# Patient Record
Sex: Female | Born: 1950 | Race: White | Hispanic: No | Marital: Married | Smoking: Never smoker
Health system: Southern US, Community
[De-identification: ages and names within clinical notes are randomized; demographics above are authoritative.]

## PROBLEM LIST (undated history)

## (undated) DIAGNOSIS — Z9109 Other allergy status, other than to drugs and biological substances: Secondary | ICD-10-CM

## (undated) DIAGNOSIS — E119 Type 2 diabetes mellitus without complications: Secondary | ICD-10-CM

## (undated) DIAGNOSIS — K219 Gastro-esophageal reflux disease without esophagitis: Secondary | ICD-10-CM

## (undated) DIAGNOSIS — I10 Essential (primary) hypertension: Secondary | ICD-10-CM

## (undated) DIAGNOSIS — M1990 Unspecified osteoarthritis, unspecified site: Secondary | ICD-10-CM

## (undated) DIAGNOSIS — C801 Malignant (primary) neoplasm, unspecified: Secondary | ICD-10-CM

## (undated) DIAGNOSIS — J45909 Unspecified asthma, uncomplicated: Secondary | ICD-10-CM

## (undated) HISTORY — PX: TONSILLECTOMY: SUR1361

## (undated) HISTORY — PX: BACK SURGERY: SHX140

## (undated) HISTORY — PX: ABDOMINAL HYSTERECTOMY: SHX81

## (undated) HISTORY — PX: BREAST SURGERY: SHX581

---

## 2003-11-12 DIAGNOSIS — C50919 Malignant neoplasm of unspecified site of unspecified female breast: Secondary | ICD-10-CM

## 2003-11-12 HISTORY — DX: Malignant neoplasm of unspecified site of unspecified female breast: C50.919

## 2008-11-11 DIAGNOSIS — C55 Malignant neoplasm of uterus, part unspecified: Secondary | ICD-10-CM

## 2008-11-11 HISTORY — DX: Malignant neoplasm of uterus, part unspecified: C55

## 2015-12-24 ENCOUNTER — Emergency Department (HOSPITAL_BASED_OUTPATIENT_CLINIC_OR_DEPARTMENT_OTHER)
Admission: EM | Admit: 2015-12-24 | Discharge: 2015-12-24 | Disposition: A | Discharge status: Home / Self Care | Payer: Medicare Other | Attending: Emergency Medicine | Admitting: Emergency Medicine

## 2015-12-24 ENCOUNTER — Encounter (HOSPITAL_BASED_OUTPATIENT_CLINIC_OR_DEPARTMENT_OTHER): Payer: Self-pay | Admitting: Emergency Medicine

## 2015-12-24 ENCOUNTER — Emergency Department (HOSPITAL_BASED_OUTPATIENT_CLINIC_OR_DEPARTMENT_OTHER): Payer: Medicare Other

## 2015-12-24 DIAGNOSIS — Y9289 Other specified places as the place of occurrence of the external cause: Secondary | ICD-10-CM | POA: Insufficient documentation

## 2015-12-24 DIAGNOSIS — S81011A Laceration without foreign body, right knee, initial encounter: Secondary | ICD-10-CM | POA: Insufficient documentation

## 2015-12-24 DIAGNOSIS — Y9389 Activity, other specified: Secondary | ICD-10-CM | POA: Insufficient documentation

## 2015-12-24 DIAGNOSIS — S20219A Contusion of unspecified front wall of thorax, initial encounter: Secondary | ICD-10-CM | POA: Insufficient documentation

## 2015-12-24 DIAGNOSIS — Z791 Long term (current) use of non-steroidal anti-inflammatories (NSAID): Secondary | ICD-10-CM | POA: Diagnosis not present

## 2015-12-24 DIAGNOSIS — K219 Gastro-esophageal reflux disease without esophagitis: Secondary | ICD-10-CM | POA: Insufficient documentation

## 2015-12-24 DIAGNOSIS — E119 Type 2 diabetes mellitus without complications: Secondary | ICD-10-CM | POA: Diagnosis not present

## 2015-12-24 DIAGNOSIS — Z880 Allergy status to penicillin: Secondary | ICD-10-CM | POA: Insufficient documentation

## 2015-12-24 DIAGNOSIS — J45909 Unspecified asthma, uncomplicated: Secondary | ICD-10-CM | POA: Insufficient documentation

## 2015-12-24 DIAGNOSIS — W108XXA Fall (on) (from) other stairs and steps, initial encounter: Secondary | ICD-10-CM | POA: Insufficient documentation

## 2015-12-24 DIAGNOSIS — Z7951 Long term (current) use of inhaled steroids: Secondary | ICD-10-CM | POA: Diagnosis not present

## 2015-12-24 DIAGNOSIS — S60221A Contusion of right hand, initial encounter: Secondary | ICD-10-CM | POA: Diagnosis not present

## 2015-12-24 DIAGNOSIS — Z79899 Other long term (current) drug therapy: Secondary | ICD-10-CM | POA: Diagnosis not present

## 2015-12-24 DIAGNOSIS — S6992XA Unspecified injury of left wrist, hand and finger(s), initial encounter: Secondary | ICD-10-CM | POA: Insufficient documentation

## 2015-12-24 DIAGNOSIS — I10 Essential (primary) hypertension: Secondary | ICD-10-CM | POA: Diagnosis not present

## 2015-12-24 DIAGNOSIS — Z7984 Long term (current) use of oral hypoglycemic drugs: Secondary | ICD-10-CM | POA: Insufficient documentation

## 2015-12-24 DIAGNOSIS — M1990 Unspecified osteoarthritis, unspecified site: Secondary | ICD-10-CM | POA: Insufficient documentation

## 2015-12-24 DIAGNOSIS — Y998 Other external cause status: Secondary | ICD-10-CM | POA: Diagnosis not present

## 2015-12-24 DIAGNOSIS — Z8589 Personal history of malignant neoplasm of other organs and systems: Secondary | ICD-10-CM | POA: Insufficient documentation

## 2015-12-24 DIAGNOSIS — M25561 Pain in right knee: Secondary | ICD-10-CM

## 2015-12-24 DIAGNOSIS — W19XXXA Unspecified fall, initial encounter: Secondary | ICD-10-CM

## 2015-12-24 DIAGNOSIS — S8991XA Unspecified injury of right lower leg, initial encounter: Secondary | ICD-10-CM | POA: Diagnosis present

## 2015-12-24 HISTORY — DX: Other allergy status, other than to drugs and biological substances: Z91.09

## 2015-12-24 HISTORY — DX: Unspecified asthma, uncomplicated: J45.909

## 2015-12-24 HISTORY — DX: Essential (primary) hypertension: I10

## 2015-12-24 HISTORY — DX: Type 2 diabetes mellitus without complications: E11.9

## 2015-12-24 HISTORY — DX: Unspecified osteoarthritis, unspecified site: M19.90

## 2015-12-24 HISTORY — DX: Malignant (primary) neoplasm, unspecified: C80.1

## 2015-12-24 HISTORY — DX: Gastro-esophageal reflux disease without esophagitis: K21.9

## 2015-12-24 MED ORDER — ACETAMINOPHEN 325 MG PO TABS
650.0000 mg | ORAL_TABLET | Freq: Once | ORAL | Status: AC
  Administered 2015-12-24: 650 mg via ORAL
  Filled 2015-12-24: qty 2

## 2015-12-24 MED ORDER — LIDOCAINE-EPINEPHRINE-TETRACAINE (LET) SOLUTION
3.0000 mL | Freq: Once | NASAL | Status: AC
  Administered 2015-12-24: 3 mL via TOPICAL
  Filled 2015-12-24: qty 3

## 2015-12-24 NOTE — ED Provider Notes (Signed)
CSN: [id number]     Arrival date & time 12/24/15  1638 History  By signing my name below, I, Soijett Blue, attest that this documentation has been prepared under the direction and in the presence of Josephina Gip, PA-C Electronically Signed: Soijett Blue, ED Scribe. 12/24/2015. 5:21 PM.   Chief Complaint  Patient presents with  . Fall   The history is provided by the patient. No language interpreter was used.    Laura Sherman is a 65 y.o. female with a medical hx of DM, arthritis, HTN, CA who presents to the Emergency Department complaining of a fall onset today. Pt notes that she tripped on a dog leash and fell down 2 concrete steps off her porch. She states that she fell down onto her left side at the time of the incident. Pt is having associated symptoms of left sided rib pain with associated bruising, right knee pain and bilateral hand pain. The rib pain is mild and does not increase with breathing. She denies SOB. She states that her hands are mildly aching and the pain is exacerbated by gripping. She endorses an ache in her knee from the fall but states she has chronic knee pain from advanced arthritis. Pt reports that she get injections to her right knee q 6 months and she is aware that she is in need of knee replacement surgery due to her arthritis. She also endorses a small abrasion to her right knee. Bleeding controlled with gauze at the time. She notes that she has not tried any medications for the relief of her symptoms. She states "I'm fine, I wouldn't have come here if my daughter didn't make me". She denies hitting her head, LOC, SOB, numbness, tingling, gait problem, weakness, neck pain, back pain, and any other symptoms. Pt denies being on blood thinners at this time, but she does take ASA daily.    Past Medical History  Diagnosis Date  . Asthma   . Diabetes mellitus without complication ([place])   . Arthritis   . Cancer ([place])   . Hypertension   . GERD (gastroesophageal reflux  disease)   . Environmental allergies    Past Surgical History  Procedure Laterality Date  . Back surgery    . Breast surgery    . Abdominal hysterectomy    . Tonsillectomy     History reviewed. No pertinent family history. Social History  Substance Use Topics  . Smoking status: Never Smoker   . Smokeless tobacco: None  . Alcohol Use: No   OB History    No data available     Review of Systems  Respiratory: Negative for shortness of breath.   Musculoskeletal: Positive for arthralgias. Negative for back pain, joint swelling and neck pain.  Skin: Positive for color change and wound. Negative for rash.  Neurological: Negative for syncope, numbness and headaches.       No tingling  All other systems reviewed and are negative.    Allergies  Penicillins  Home Medications   Prior to Admission medications   Medication Sig Start Date End Date Taking? Authorizing Provider  amLODipine (NORVASC) 10 MG tablet Take 10 mg by mouth daily.   Yes Historical Provider, MD  atorvastatin (LIPITOR) 20 MG tablet Take 20 mg by mouth daily.   Yes Historical Provider, MD  budesonide (PULMICORT) 180 MCG/ACT inhaler Inhale into the lungs 2 (two) times daily.   Yes Historical Provider, MD  diclofenac (VOLTAREN) 75 MG EC tablet Take 75 mg by mouth  2 (two) times daily.   Yes Historical Provider, MD  fluticasone (FLOVENT DISKUS) 50 MCG/BLIST diskus inhaler Inhale 1 puff into the lungs 2 (two) times daily.   Yes Historical Provider, MD  Hylan (SYNVISC IX) Inject into the articular space.   Yes Historical Provider, MD  loratadine (CLARITIN) 10 MG tablet Take 10 mg by mouth daily.   Yes Historical Provider, MD  losartan-hydrochlorothiazide (HYZAAR) 100-25 MG tablet Take 1 tablet by mouth daily.   Yes Historical Provider, MD  metFORMIN (GLUCOPHAGE) 500 MG tablet Take by mouth 2 (two) times daily with a meal.   Yes Historical Provider, MD  metoprolol (LOPRESSOR) 100 MG tablet Take 100 mg by mouth 2 (two)  times daily.   Yes Historical Provider, MD  omeprazole (PRILOSEC) 10 MG capsule Take 10 mg by mouth daily.   Yes Historical Provider, MD  pioglitazone (ACTOS) 30 MG tablet Take 30 mg by mouth daily.   Yes Historical Provider, MD   BP 149/79 mmHg  Pulse 87  Temp(Src) 98.1 F (36.7 C) (Oral)  Resp 18  SpO2 97% Physical Exam  Constitutional: She appears well-developed and well-nourished. No distress.  HENT:  Head: Normocephalic and atraumatic.  Right Ear: External ear normal.  Left Ear: External ear normal.  Eyes: Conjunctivae are normal. Right eye exhibits no discharge. Left eye exhibits no discharge. No scleral icterus.  Neck: Normal range of motion.  Cardiovascular: Normal rate, regular rhythm, normal heart sounds and intact distal pulses.  Exam reveals no gallop and no friction rub.   No murmur heard. Cap refill < 3 seconds  Pulmonary/Chest: Effort normal and breath sounds normal. No respiratory distress. She has no wheezes. She has no rales. She exhibits tenderness. She exhibits no crepitus, no deformity and no retraction.    Lungs clear to ausculation bilaterally. Mild tenderness over superior left anterior chest wall with faint ecchymosis. No bony deformities.   Musculoskeletal: Normal range of motion.       Right knee: She exhibits laceration. She exhibits normal range of motion, no swelling, no ecchymosis and normal alignment. Tenderness found.       Right hand: She exhibits tenderness. She exhibits normal range of motion, normal capillary refill, no deformity and no swelling. Normal sensation noted. Normal strength noted.       Left hand: She exhibits tenderness. She exhibits normal range of motion, normal capillary refill, no deformity and no swelling. Normal sensation noted. Normal strength noted.       Hands:      Legs: Tenderness over left 5th metacarpal head. Tenderness and faint ecchymosis over right thenar eminence. FROM of digits, wrists and elbows without pain. No edema  or deformity of the upper extremities. Mild pain over anterior knee. FROM intact without malalignment. Laceration to right anterior knee with bleeding controlled.   Neurological: She is alert. Coordination normal.  5/5 strength of all major muscle groups. Sensation to light touch intact throughout.   Skin: Skin is warm and dry.  1.5 cm laceration noted to right anterior knee. Wound edges approximate well. Depth of approximately 5 mm. No foreign bodies visualized after irrigation.   Psychiatric: She has a normal mood and affect. Her behavior is normal.  Nursing note and vitals reviewed.   ED Course  Procedures (including critical care time) LACERATION REPAIR Performed by: Eston Esters Authorized by: Eston Esters Consent: Verbal consent obtained. Risks and benefits: risks, benefits and alternatives were discussed Consent given by: patient Patient identity confirmed: provided demographic data  Prepped and Draped in normal sterile fashion Wound explored  Laceration Location: right knee  Laceration Length: 1.5 cm  No Foreign Bodies seen or palpated  Anesthesia: local infiltration  Local anesthetic: LET  Irrigation method: syringe  Amount of cleaning: standard  Skin closure: 3-0 prolene  Number of sutures: 1  Technique: simple interrupted  Patient tolerance: Patient tolerated the procedure well with no immediate complications.   DIAGNOSTIC STUDIES: Oxygen Saturation is 99% on RA, nl by my interpretation.    COORDINATION OF CARE: 5:21 PM Discussed treatment plan with pt at bedside which includes right knee xray and tylenol, and pt agreed to plan.   Labs Review Labs Reviewed - No data to display  Imaging Review Dg Chest 2 View  12/24/2015  CLINICAL DATA:  Tripped on a dog leash and fell down 2 concrete steps off porch. Left-sided rib pain. Initial encounter. EXAM: CHEST  2 VIEW COMPARISON:  None. FINDINGS: The cardiac silhouette is upper limits of normal in size.  There is minimal left basilar atelectasis or scarring. No edema, pleural effusion, or pneumothorax is seen. No displaced rib fracture is identified. Thoracic spondylosis and right breast surgical clips are noted. IMPRESSION: Minimal left basilar atelectasis or scarring. Electronically Signed   By: Logan Bores M.D.   On: 12/24/2015 18:19   Dg Knee Complete 4 Views Right  12/24/2015  CLINICAL DATA:  65 year old female with fall and right knee pain. EXAM: RIGHT KNEE - COMPLETE 4+ VIEW COMPARISON:  None. FINDINGS: There is no acute fracture or dislocation. There is osteopenia with advanced osteoarthritic changes of the right knee with of severe tricompartmental narrowing and osteophyte formation. No significant joint effusion identified. The soft tissues are grossly unremarkable. A 1.5 cm focus of in amorphous calcification is noted within the soft tissues of the distal thigh along the lateral aspect of the distal femur. IMPRESSION: No acute fracture or dislocation. Advanced osteoarthritic changes. Electronically Signed   By: Anner Crete M.D.   On: 12/24/2015 18:16   Dg Hand Complete Left  12/24/2015  CLINICAL DATA:  Tripped on a dog leash and fell down 2 concrete steps off porch. Bilateral hand pain and abrasions. Initial encounter. EXAM: LEFT HAND - COMPLETE 3+ VIEW COMPARISON:  None. FINDINGS: No acute fracture or dislocation is identified. The bones are diffusely osteopenic. Degenerative changes are noted at the first [facility] joint including prominent joint space loss and osteophyte formation. No destructive osseous lesion or soft tissue abnormality is seen. IMPRESSION: 1. No acute osseous abnormality identified. 2. Advanced first [facility] joint degenerative change. Electronically Signed   By: Logan Bores M.D.   On: 12/24/2015 18:19   Dg Hand Complete Right  12/24/2015  CLINICAL DATA:  Fall down 2 steps, bilateral hand pain EXAM: RIGHT HAND - COMPLETE 3+ VIEW COMPARISON:  None. FINDINGS: No fracture or  dislocation is seen. Moderate degenerative changes of the 1st carpometacarpal joint. Periarticular osteopenia. Mild negative ulnar variance. The visualized soft tissues are unremarkable. IMPRESSION: No fracture or dislocation is seen. Electronically Signed   By: Julian Hy M.D.   On: 12/24/2015 18:13   I have personally reviewed and evaluated these images as part of my medical decision-making.   EKG Interpretation None      MDM   Final diagnoses:  Fall, initial encounter  Right knee pain   65 year old female presenting after a fall. No head injury or LOC. Complaining of right knee, bilateral hand and left rib pain. VSS. Pt is nontoxic appearing.  Extremities are neurovascularly intact with FROM. No obvious edema or deformity. Small 1.5 cm laceration noted to anterior knee right. Pt moves all extremities spontaneously and walks with a steady gait. X-ray of chest, hands and knee negative. Pain treated with tylenol. Laceration repaired.Pressure irrigation performed. Wound explored and base of wound visualized in a bloodless field without evidence of foreign body. Instructed pt to follow up with PCP, UC or ED in 8-10 days for suture removal. Recommend conservative treatment including ice, heat, tylenol or motrin for pain control. Pt seen by Dr. Laneta Simmers who agrees with plan and discharge. Return precautions given in discharge paperwork and discussed with pt at bedside. Pt stable for discharge  I personally performed the services described in this documentation, which was scribed in my presence. The recorded information has been reviewed and is accurate.   Josephina Gip, PA-C 12/24/15 2000  Leo Grosser, MD 12/25/15 [id number]

## 2015-12-24 NOTE — ED Notes (Signed)
Patient states that she fell off her porch. Has pain to her right knee, and pain to her left ribs.

## 2015-12-24 NOTE — Discharge Instructions (Signed)
Go to your PCP, an urgent care or return to ED in 8-10 days for suture removal.    Knee Pain Knee pain is a very common symptom and can have many causes. Knee pain often goes away when you follow your health care provider's instructions for relieving pain and discomfort at home. However, knee pain can develop into a condition that needs treatment. Some conditions may include:  Arthritis caused by wear and tear (osteoarthritis).  Arthritis caused by swelling and irritation (rheumatoid arthritis or gout).  A cyst or growth in your knee.  An infection in your knee joint.  An injury that will not heal.  Damage, swelling, or irritation of the tissues that support your knee (torn ligaments or tendinitis). If your knee pain continues, additional tests may be ordered to diagnose your condition. Tests may include X-rays or other imaging studies of your knee. You may also need to have fluid removed from your knee. Treatment for ongoing knee pain depends on the cause, but treatment may include:  Medicines to relieve pain or swelling.  Steroid injections in your knee.  Physical therapy.  Surgery. HOME CARE INSTRUCTIONS  Take medicines only as directed by your health care provider.  Rest your knee and keep it raised (elevated) while you are resting.  Do not do things that cause or worsen pain.  Avoid high-impact activities or exercises, such as running, jumping rope, or doing jumping jacks.  Apply ice to the knee area:  Put ice in a plastic bag.  Place a towel between your skin and the bag.  Leave the ice on for 20 minutes, 2-3 times a day.  Ask your health care provider if you should wear an elastic knee support.  Keep a pillow under your knee when you sleep.  Lose weight if you are overweight. Extra weight can put pressure on your knee.  Do not use any tobacco products, including cigarettes, chewing tobacco, or electronic cigarettes. If you need help quitting, ask your health  care provider. Smoking may slow the healing of any bone and joint problems that you may have. SEEK MEDICAL CARE IF:  Your knee pain continues, changes, or gets worse.  You have a fever along with knee pain.  Your knee buckles or locks up.  Your knee becomes more swollen. SEEK IMMEDIATE MEDICAL CARE IF:   Your knee joint feels hot to the touch.  You have chest pain or trouble breathing.   This information is not intended to replace advice given to you by your health care provider. Make sure you discuss any questions you have with your health care provider.   Document Released: 08/25/2007 Document Revised: 11/18/2014 Document Reviewed: 06/13/2014 Elsevier Interactive Patient Education 2016 [place]    Sutures are stitches that can be used to close wounds. Taking care of your wound properly can help to prevent pain and infection. It can also help your wound to heal more quickly.  HOW TO CARE FOR YOUR SUTURED WOUND  Wound Care  Keep the wound clean and dry.  If you were given a bandage (dressing), you should change it at least once per day or as directed by your health care provider. You should also change it if it becomes wet or dirty.  Keep the wound completely dry for the first 24 hours or as directed by your health care provider. After that time, you may shower or bathe. However, make sure that the wound is not soaked in water until  the sutures have been removed.  Clean the wound one time each day or as directed by your health care provider.  Wash the wound with soap and water.  Rinse the wound with water to remove all soap.  Pat the wound dry with a clean towel. Do not rub the wound. After cleaning the wound, apply a thin layer of antibiotic ointment as directed by your health care provider. This will help to prevent infection and keep the dressing from sticking to the wound.  Have the sutures removed as directed by your health care provider. General  Instructions  Take or apply medicines only as directed by your health care provider.  To help prevent scarring, make sure to cover your wound with sunscreen whenever you are outside after the sutures are removed and the wound is healed. Make sure to wear a sunscreen of at least 30 SPF.  If you were prescribed an antibiotic medicine or ointment, finish all of it even if you start to feel better.  Do not scratch or pick at the wound.  Keep all follow-up visits as directed by your health care provider. This is important.  Check your wound every day for signs of infection. Watch for:  Redness, swelling, or pain.  Fluid, blood, or pus. Raise (elevate) the injured area above the level of your heart while you are sitting or lying down, if possible.  Avoid stretching your wound.  Drink enough fluids to keep your urine clear or pale yellow. SEEK MEDICAL CARE IF:  You received a tetanus shot and you have swelling, severe pain, redness, or bleeding at the injection site.  You have a fever.  A wound that was closed breaks open.  You notice a bad smell coming from the wound.  You notice something coming out of the wound, such as wood or glass.  Your pain is not controlled with medicine.  You have increased redness, swelling, or pain at the site of your wound.  You have fluid, blood, or pus coming from your wound.  You notice a change in the color of your skin near your wound.  You need to change the dressing frequently due to fluid, blood, or pus draining from the wound.  You develop a new rash.  You develop numbness around the wound. SEEK IMMEDIATE MEDICAL CARE IF:  You develop severe swelling around the injury site.  Your pain suddenly increases and is severe.  You develop painful lumps near the wound or on skin that is anywhere on your body.  You have a red streak going away from your wound.  The wound is on your hand or foot and you cannot properly move a finger or toe.  The wound is on your  hand or foot and you notice that your fingers or toes look pale or bluish. This information is not intended to replace advice given to you by your health care provider. Make sure you discuss any questions you have with your health care provider.  Document Released: 12/05/2004 Document Revised: 03/14/2015 Document Reviewed: 06/09/2013  Elsevier Interactive Patient Education [employer].

## 2019-12-02 ENCOUNTER — Ambulatory Visit: Payer: Medicare Other | Attending: Internal Medicine

## 2019-12-02 DIAGNOSIS — Z23 Encounter for immunization: Secondary | ICD-10-CM | POA: Insufficient documentation

## 2019-12-02 NOTE — Progress Notes (Signed)
   Covid-19 Vaccination Clinic  Name:  JAVAYAH HALDEMAN    MRN: [id number] DOB: 1951-08-30  12/02/2019  Ms. Mizer was observed post Covid-19 immunization for 15 minutes without incidence. She was provided with Vaccine Information Sheet and instruction to access the V-Safe system.   Ms. Sereno was instructed to call 911 with any severe reactions post vaccine: Marland Kitchen Difficulty breathing  . Swelling of your face and throat  . A fast heartbeat  . A bad rash all over your body  . Dizziness and weakness    Immunizations Administered    Name Date Dose VIS Date Route   Pfizer COVID-19 Vaccine 12/02/2019  5:57 PM 0.3 mL 10/22/2019 Intramuscular   Manufacturer: [place]   Lot: [id number]   [place]: [id number]

## 2019-12-24 ENCOUNTER — Ambulatory Visit: Payer: Medicare Other | Attending: Internal Medicine

## 2019-12-24 DIAGNOSIS — Z23 Encounter for immunization: Secondary | ICD-10-CM | POA: Insufficient documentation

## 2019-12-24 NOTE — Progress Notes (Signed)
   Covid-19 Vaccination Clinic  Name:  Laura Sherman    MRN: [id number] DOB: [birth year]  12/24/2019  Ms. Laura Sherman was observed post Covid-19 immunization for 15 minutes without incidence. She was provided with Vaccine Information Sheet and instruction to access the V-Safe system.   Ms. Laura Sherman was instructed to call 911 with any severe reactions post vaccine: Marland Kitchen Difficulty breathing  . Swelling of your face and throat  . A fast heartbeat  . A bad rash all over your body  . Dizziness and weakness    Immunizations Administered    Name Date Dose VIS Date Route   Pfizer COVID-19 Vaccine 12/24/2019  5:10 PM 0.3 mL 10/22/2019 Intramuscular   Manufacturer: [place]   Lot: [id number]   [place]: [id number]

## 2021-03-08 ENCOUNTER — Emergency Department (HOSPITAL_BASED_OUTPATIENT_CLINIC_OR_DEPARTMENT_OTHER): Payer: Medicare Other

## 2021-03-08 ENCOUNTER — Inpatient Hospital Stay (HOSPITAL_BASED_OUTPATIENT_CLINIC_OR_DEPARTMENT_OTHER)
Admission: EM | Admit: 2021-03-08 | Discharge: 2021-04-11 | DRG: 435 | Disposition: E | Payer: Medicare Other | Attending: Internal Medicine | Admitting: Internal Medicine

## 2021-03-08 ENCOUNTER — Other Ambulatory Visit: Payer: Self-pay

## 2021-03-08 ENCOUNTER — Encounter (HOSPITAL_BASED_OUTPATIENT_CLINIC_OR_DEPARTMENT_OTHER): Payer: Self-pay | Admitting: Emergency Medicine

## 2021-03-08 DIAGNOSIS — Z20822 Contact with and (suspected) exposure to covid-19: Secondary | ICD-10-CM | POA: Diagnosis present

## 2021-03-08 DIAGNOSIS — I700 Atherosclerosis of aorta: Secondary | ICD-10-CM

## 2021-03-08 DIAGNOSIS — J9811 Atelectasis: Secondary | ICD-10-CM | POA: Diagnosis not present

## 2021-03-08 DIAGNOSIS — E872 Acidosis, unspecified: Secondary | ICD-10-CM

## 2021-03-08 DIAGNOSIS — Z881 Allergy status to other antibiotic agents status: Secondary | ICD-10-CM

## 2021-03-08 DIAGNOSIS — K8689 Other specified diseases of pancreas: Secondary | ICD-10-CM

## 2021-03-08 DIAGNOSIS — E662 Morbid (severe) obesity with alveolar hypoventilation: Secondary | ICD-10-CM | POA: Diagnosis present

## 2021-03-08 DIAGNOSIS — Z800 Family history of malignant neoplasm of digestive organs: Secondary | ICD-10-CM

## 2021-03-08 DIAGNOSIS — Z6841 Body Mass Index (BMI) 40.0 and over, adult: Secondary | ICD-10-CM

## 2021-03-08 DIAGNOSIS — J9601 Acute respiratory failure with hypoxia: Secondary | ICD-10-CM | POA: Diagnosis not present

## 2021-03-08 DIAGNOSIS — J9602 Acute respiratory failure with hypercapnia: Secondary | ICD-10-CM | POA: Diagnosis not present

## 2021-03-08 DIAGNOSIS — D689 Coagulation defect, unspecified: Secondary | ICD-10-CM

## 2021-03-08 DIAGNOSIS — K8021 Calculus of gallbladder without cholecystitis with obstruction: Secondary | ICD-10-CM | POA: Diagnosis present

## 2021-03-08 DIAGNOSIS — Z95828 Presence of other vascular implants and grafts: Secondary | ICD-10-CM

## 2021-03-08 DIAGNOSIS — C259 Malignant neoplasm of pancreas, unspecified: Secondary | ICD-10-CM

## 2021-03-08 DIAGNOSIS — C801 Malignant (primary) neoplasm, unspecified: Secondary | ICD-10-CM

## 2021-03-08 DIAGNOSIS — Z66 Do not resuscitate: Secondary | ICD-10-CM | POA: Diagnosis present

## 2021-03-08 DIAGNOSIS — Z882 Allergy status to sulfonamides status: Secondary | ICD-10-CM

## 2021-03-08 DIAGNOSIS — D631 Anemia in chronic kidney disease: Secondary | ICD-10-CM | POA: Diagnosis present

## 2021-03-08 DIAGNOSIS — Z452 Encounter for adjustment and management of vascular access device: Secondary | ICD-10-CM

## 2021-03-08 DIAGNOSIS — N179 Acute kidney failure, unspecified: Secondary | ICD-10-CM | POA: Diagnosis not present

## 2021-03-08 DIAGNOSIS — Z880 Allergy status to penicillin: Secondary | ICD-10-CM

## 2021-03-08 DIAGNOSIS — C787 Secondary malignant neoplasm of liver and intrahepatic bile duct: Principal | ICD-10-CM | POA: Diagnosis present

## 2021-03-08 DIAGNOSIS — R7989 Other specified abnormal findings of blood chemistry: Secondary | ICD-10-CM | POA: Diagnosis present

## 2021-03-08 DIAGNOSIS — E8770 Fluid overload, unspecified: Secondary | ICD-10-CM | POA: Diagnosis present

## 2021-03-08 DIAGNOSIS — Z79899 Other long term (current) drug therapy: Secondary | ICD-10-CM

## 2021-03-08 DIAGNOSIS — Z853 Personal history of malignant neoplasm of breast: Secondary | ICD-10-CM

## 2021-03-08 DIAGNOSIS — I471 Supraventricular tachycardia: Secondary | ICD-10-CM | POA: Diagnosis present

## 2021-03-08 DIAGNOSIS — E119 Type 2 diabetes mellitus without complications: Secondary | ICD-10-CM

## 2021-03-08 DIAGNOSIS — Z8542 Personal history of malignant neoplasm of other parts of uterus: Secondary | ICD-10-CM

## 2021-03-08 DIAGNOSIS — Z7189 Other specified counseling: Secondary | ICD-10-CM | POA: Diagnosis not present

## 2021-03-08 DIAGNOSIS — Z515 Encounter for palliative care: Secondary | ICD-10-CM

## 2021-03-08 DIAGNOSIS — Z825 Family history of asthma and other chronic lower respiratory diseases: Secondary | ICD-10-CM

## 2021-03-08 DIAGNOSIS — R933 Abnormal findings on diagnostic imaging of other parts of digestive tract: Secondary | ICD-10-CM

## 2021-03-08 DIAGNOSIS — R579 Shock, unspecified: Secondary | ICD-10-CM

## 2021-03-08 DIAGNOSIS — N170 Acute kidney failure with tubular necrosis: Secondary | ICD-10-CM | POA: Diagnosis present

## 2021-03-08 DIAGNOSIS — K7200 Acute and subacute hepatic failure without coma: Secondary | ICD-10-CM

## 2021-03-08 DIAGNOSIS — R34 Anuria and oliguria: Secondary | ICD-10-CM | POA: Diagnosis present

## 2021-03-08 DIAGNOSIS — G9341 Metabolic encephalopathy: Secondary | ICD-10-CM | POA: Diagnosis present

## 2021-03-08 DIAGNOSIS — I4891 Unspecified atrial fibrillation: Secondary | ICD-10-CM | POA: Diagnosis not present

## 2021-03-08 DIAGNOSIS — E1122 Type 2 diabetes mellitus with diabetic chronic kidney disease: Secondary | ICD-10-CM | POA: Diagnosis present

## 2021-03-08 DIAGNOSIS — R0682 Tachypnea, not elsewhere classified: Secondary | ICD-10-CM

## 2021-03-08 DIAGNOSIS — E1165 Type 2 diabetes mellitus with hyperglycemia: Secondary | ICD-10-CM | POA: Diagnosis present

## 2021-03-08 DIAGNOSIS — C7802 Secondary malignant neoplasm of left lung: Secondary | ICD-10-CM | POA: Diagnosis present

## 2021-03-08 DIAGNOSIS — I129 Hypertensive chronic kidney disease with stage 1 through stage 4 chronic kidney disease, or unspecified chronic kidney disease: Secondary | ICD-10-CM | POA: Diagnosis present

## 2021-03-08 DIAGNOSIS — R627 Adult failure to thrive: Secondary | ICD-10-CM | POA: Diagnosis present

## 2021-03-08 DIAGNOSIS — C7801 Secondary malignant neoplasm of right lung: Secondary | ICD-10-CM

## 2021-03-08 DIAGNOSIS — J811 Chronic pulmonary edema: Secondary | ICD-10-CM | POA: Diagnosis present

## 2021-03-08 DIAGNOSIS — E871 Hypo-osmolality and hyponatremia: Secondary | ICD-10-CM | POA: Diagnosis present

## 2021-03-08 DIAGNOSIS — E875 Hyperkalemia: Secondary | ICD-10-CM | POA: Diagnosis present

## 2021-03-08 DIAGNOSIS — R062 Wheezing: Secondary | ICD-10-CM

## 2021-03-08 DIAGNOSIS — Z9221 Personal history of antineoplastic chemotherapy: Secondary | ICD-10-CM

## 2021-03-08 DIAGNOSIS — N1831 Chronic kidney disease, stage 3a: Secondary | ICD-10-CM | POA: Diagnosis present

## 2021-03-08 DIAGNOSIS — K831 Obstruction of bile duct: Secondary | ICD-10-CM | POA: Diagnosis present

## 2021-03-08 DIAGNOSIS — Z789 Other specified health status: Secondary | ICD-10-CM

## 2021-03-08 DIAGNOSIS — Z923 Personal history of irradiation: Secondary | ICD-10-CM

## 2021-03-08 DIAGNOSIS — K219 Gastro-esophageal reflux disease without esophagitis: Secondary | ICD-10-CM | POA: Diagnosis present

## 2021-03-08 DIAGNOSIS — I9589 Other hypotension: Secondary | ICD-10-CM | POA: Diagnosis present

## 2021-03-08 DIAGNOSIS — Z7984 Long term (current) use of oral hypoglycemic drugs: Secondary | ICD-10-CM

## 2021-03-08 DIAGNOSIS — Z91048 Other nonmedicinal substance allergy status: Secondary | ICD-10-CM

## 2021-03-08 DIAGNOSIS — D630 Anemia in neoplastic disease: Secondary | ICD-10-CM | POA: Diagnosis present

## 2021-03-08 LAB — COMPREHENSIVE METABOLIC PANEL WITH GFR
ALT: 381 U/L — ABNORMAL HIGH (ref 0–44)
AST: 348 U/L — ABNORMAL HIGH (ref 15–41)
Albumin: 3.2 g/dL — ABNORMAL LOW (ref 3.5–5.0)
Alkaline Phosphatase: 1644 U/L — ABNORMAL HIGH (ref 38–126)
Anion gap: 12 (ref 5–15)
BUN: 62 mg/dL — ABNORMAL HIGH (ref 8–23)
CO2: 17 mmol/L — ABNORMAL LOW (ref 22–32)
Calcium: 9.4 mg/dL (ref 8.9–10.3)
Chloride: 97 mmol/L — ABNORMAL LOW (ref 98–111)
Creatinine, Ser: 2.38 mg/dL — ABNORMAL HIGH (ref 0.44–1.00)
GFR, Estimated: 21 mL/min — ABNORMAL LOW
Glucose, Bld: 323 mg/dL — ABNORMAL HIGH (ref 70–99)
Potassium: 6.5 mmol/L (ref 3.5–5.1)
Sodium: 126 mmol/L — ABNORMAL LOW (ref 135–145)
Total Bilirubin: 16 mg/dL — ABNORMAL HIGH (ref 0.3–1.2)
Total Protein: 6.9 g/dL (ref 6.5–8.1)

## 2021-03-08 LAB — HEPATITIS PANEL, ACUTE
HCV Ab: NONREACTIVE
Hep A IgM: NONREACTIVE
Hep B C IgM: NONREACTIVE
Hepatitis B Surface Ag: NONREACTIVE

## 2021-03-08 LAB — URINALYSIS, ROUTINE W REFLEX MICROSCOPIC
Glucose, UA: 100 mg/dL — AB
Ketones, ur: 15 mg/dL — AB
Nitrite: POSITIVE — AB
Protein, ur: 100 mg/dL — AB
Specific Gravity, Urine: 1.025 (ref 1.005–1.030)
pH: 6.5 (ref 5.0–8.0)

## 2021-03-08 LAB — COMPREHENSIVE METABOLIC PANEL
ALT: 323 U/L — ABNORMAL HIGH (ref 0–44)
AST: 291 U/L — ABNORMAL HIGH (ref 15–41)
Albumin: 2.9 g/dL — ABNORMAL LOW (ref 3.5–5.0)
Alkaline Phosphatase: 1459 U/L — ABNORMAL HIGH (ref 38–126)
Anion gap: 9 (ref 5–15)
BUN: 61 mg/dL — ABNORMAL HIGH (ref 8–23)
CO2: 19 mmol/L — ABNORMAL LOW (ref 22–32)
Calcium: 11.2 mg/dL — ABNORMAL HIGH (ref 8.9–10.3)
Chloride: 98 mmol/L (ref 98–111)
Creatinine, Ser: 2.26 mg/dL — ABNORMAL HIGH (ref 0.44–1.00)
GFR, Estimated: 23 mL/min — ABNORMAL LOW (ref >60)
Glucose, Bld: 427 mg/dL — ABNORMAL HIGH (ref 70–99)
Potassium: 6.1 mmol/L — ABNORMAL HIGH (ref 3.5–5.1)
Sodium: 126 mmol/L — ABNORMAL LOW (ref 135–145)
Total Bilirubin: 14.3 mg/dL — ABNORMAL HIGH (ref 0.3–1.2)
Total Protein: 6 g/dL — ABNORMAL LOW (ref 6.5–8.1)

## 2021-03-08 LAB — URINALYSIS, MICROSCOPIC (REFLEX): RBC / HPF: >50 RBC/hpf (ref 0–5)

## 2021-03-08 LAB — TROPONIN I (HIGH SENSITIVITY)
Troponin I (High Sensitivity): 30 ng/L — ABNORMAL HIGH
Troponin I (High Sensitivity): 29 ng/L — ABNORMAL HIGH

## 2021-03-08 LAB — CBC WITH DIFFERENTIAL/PLATELET
Abs Immature Granulocytes: 0.05 K/uL (ref 0.00–0.07)
Basophils Absolute: 0.1 K/uL (ref 0.0–0.1)
Basophils Relative: 1 %
Eosinophils Absolute: 0.3 K/uL (ref 0.0–0.5)
Eosinophils Relative: 3 %
HCT: 31.3 % — ABNORMAL LOW (ref 36.0–46.0)
Hemoglobin: 10.4 g/dL — ABNORMAL LOW (ref 12.0–15.0)
Immature Granulocytes: 1 %
Lymphocytes Relative: 4 %
Lymphs Abs: 0.4 K/uL — ABNORMAL LOW (ref 0.7–4.0)
MCH: 28.8 pg (ref 26.0–34.0)
MCHC: 33.2 g/dL (ref 30.0–36.0)
MCV: 86.7 fL (ref 80.0–100.0)
Monocytes Absolute: 0.8 K/uL (ref 0.1–1.0)
Monocytes Relative: 7 %
Neutro Abs: 8.8 K/uL — ABNORMAL HIGH (ref 1.7–7.7)
Neutrophils Relative %: 84 %
Platelets: 282 K/uL (ref 150–400)
RBC: 3.61 MIL/uL — ABNORMAL LOW (ref 3.87–5.11)
RDW: 16.4 % — ABNORMAL HIGH (ref 11.5–15.5)
WBC: 10.4 K/uL (ref 4.0–10.5)
nRBC: 0 % (ref 0.0–0.2)

## 2021-03-08 LAB — LIPASE, BLOOD: Lipase: 182 U/L — ABNORMAL HIGH (ref 11–51)

## 2021-03-08 LAB — ETHANOL: Alcohol, Ethyl (B): 12 mg/dL — ABNORMAL HIGH

## 2021-03-08 LAB — POTASSIUM: Potassium: 5.4 mmol/L — ABNORMAL HIGH (ref 3.5–5.1)

## 2021-03-08 LAB — RESP PANEL BY RT-PCR (FLU A&B, COVID) ARPGX2
Influenza A by PCR: NEGATIVE
Influenza B by PCR: NEGATIVE
SARS Coronavirus 2 by RT PCR: NEGATIVE

## 2021-03-08 LAB — LACTIC ACID, PLASMA
Lactic Acid, Venous: 0.8 mmol/L (ref 0.5–1.9)
Lactic Acid, Venous: 1 mmol/L (ref 0.5–1.9)

## 2021-03-08 LAB — GLUCOSE, CAPILLARY: Glucose-Capillary: 305 mg/dL — ABNORMAL HIGH (ref 70–99)

## 2021-03-08 LAB — ACETAMINOPHEN LEVEL: Acetaminophen (Tylenol), Serum: <10 ug/mL — ABNORMAL LOW (ref 10–30)

## 2021-03-08 LAB — PROTIME-INR
INR: 2.5 — ABNORMAL HIGH (ref 0.8–1.2)
Prothrombin Time: 27 seconds — ABNORMAL HIGH (ref 11.4–15.2)

## 2021-03-08 MED ORDER — HYDROCODONE BIT-HOMATROP MBR 5-1.5 MG/5ML PO SOLN: 5.0000 mL | Freq: Once | ORAL | Status: DC

## 2021-03-08 MED ORDER — AMLODIPINE BESYLATE 10 MG PO TABS
10.0000 mg | ORAL_TABLET | Freq: Every day | ORAL | Status: DC
  Administered 2021-03-09 – 2021-03-12 (×4): 10 mg via ORAL
  Filled 2021-03-08 (×4): qty 1

## 2021-03-08 MED ORDER — SODIUM CHLORIDE 0.9 % IV BOLUS
500.0000 mL | Freq: Once | INTRAVENOUS | Status: AC
  Administered 2021-03-08: 500 mL via INTRAVENOUS

## 2021-03-08 MED ORDER — INSULIN ASPART 100 UNIT/ML IJ SOLN
0.0000 [IU] | Freq: Three times a day (TID) | INTRAMUSCULAR | Status: DC
  Administered 2021-03-09 (×2): 5 [IU] via SUBCUTANEOUS
  Administered 2021-03-09 – 2021-03-10 (×2): 3 [IU] via SUBCUTANEOUS
  Administered 2021-03-10: 2 [IU] via SUBCUTANEOUS
  Administered 2021-03-10: 3 [IU] via SUBCUTANEOUS
  Administered 2021-03-11: 2 [IU] via SUBCUTANEOUS
  Administered 2021-03-11: 5 [IU] via SUBCUTANEOUS

## 2021-03-08 MED ORDER — ONDANSETRON HCL 4 MG PO TABS: 4.0000 mg | ORAL_TABLET | Freq: Four times a day (QID) | ORAL | Status: DC | PRN

## 2021-03-08 MED ORDER — PANTOPRAZOLE SODIUM 40 MG PO TBEC
40.0000 mg | DELAYED_RELEASE_TABLET | Freq: Every day | ORAL | Status: DC
  Administered 2021-03-09 – 2021-03-19 (×9): 40 mg via ORAL
  Filled 2021-03-08 (×10): qty 1

## 2021-03-08 MED ORDER — CALCIUM GLUCONATE-NACL 1-0.675 GM/50ML-% IV SOLN
1.0000 g | Freq: Once | INTRAVENOUS | Status: AC
  Administered 2021-03-08: 1000 mg via INTRAVENOUS
  Filled 2021-03-08: qty 50

## 2021-03-08 MED ORDER — ONDANSETRON HCL 4 MG/2ML IJ SOLN
4.0000 mg | Freq: Once | INTRAMUSCULAR | Status: AC
  Administered 2021-03-08: 4 mg via INTRAVENOUS
  Filled 2021-03-08: qty 2

## 2021-03-08 MED ORDER — METOPROLOL TARTRATE 25 MG PO TABS
100.0000 mg | ORAL_TABLET | Freq: Two times a day (BID) | ORAL | Status: DC
  Administered 2021-03-08 – 2021-03-14 (×13): 100 mg via ORAL
  Filled 2021-03-08 (×8): qty 2
  Filled 2021-03-08: qty 4
  Filled 2021-03-08 (×5): qty 2

## 2021-03-08 MED ORDER — SODIUM CHLORIDE 0.9 % IV BOLUS
500.0000 mL | Freq: Once | INTRAVENOUS | Status: AC
Start: 2021-03-08 — End: 2021-03-08
  Administered 2021-03-08: 500 mL via INTRAVENOUS

## 2021-03-08 MED ORDER — ATORVASTATIN CALCIUM 20 MG PO TABS
20.0000 mg | ORAL_TABLET | Freq: Every day | ORAL | Status: DC
  Administered 2021-03-09 – 2021-03-11 (×3): 20 mg via ORAL
  Filled 2021-03-08 (×3): qty 1

## 2021-03-08 MED ORDER — SODIUM CHLORIDE 0.9 % IV SOLN: 1.0000 g | Freq: Once | INTRAVENOUS | Status: DC

## 2021-03-08 MED ORDER — SODIUM BICARBONATE 8.4 % IV SOLN
50.0000 meq | Freq: Once | INTRAVENOUS | Status: AC
  Administered 2021-03-08: 50 meq via INTRAVENOUS
  Filled 2021-03-08: qty 50

## 2021-03-08 MED ORDER — INSULIN ASPART 100 UNIT/ML IJ SOLN
10.0000 [IU] | Freq: Once | INTRAMUSCULAR | Status: AC
  Administered 2021-03-08: 10 [IU] via INTRAVENOUS

## 2021-03-08 MED ORDER — INSULIN REGULAR HUMAN 100 UNIT/ML IJ SOLN: 10.0000 [IU] | Freq: Once | INTRAMUSCULAR | Status: DC

## 2021-03-08 MED ORDER — SODIUM CHLORIDE 0.9 % IV SOLN: INTRAVENOUS | Status: DC

## 2021-03-08 MED ORDER — ONDANSETRON HCL 4 MG/2ML IJ SOLN: 4.0000 mg | Freq: Four times a day (QID) | INTRAMUSCULAR | Status: DC | PRN

## 2021-03-08 MED ORDER — SODIUM ZIRCONIUM CYCLOSILICATE 10 G PO PACK
10.0000 g | PACK | Freq: Once | ORAL | Status: AC
  Administered 2021-03-08: 10 g via ORAL
  Filled 2021-03-08: qty 1

## 2021-03-08 MED ORDER — DEXTROSE 50 % IV SOLN
1.0000 | Freq: Once | INTRAVENOUS | Status: AC
  Administered 2021-03-08: 50 mL via INTRAVENOUS
  Filled 2021-03-08: qty 50

## 2021-03-08 MED ORDER — SODIUM CHLORIDE 0.9% FLUSH
3.0000 mL | Freq: Two times a day (BID) | INTRAVENOUS | Status: DC
  Administered 2021-03-09 – 2021-03-21 (×20): 3 mL via INTRAVENOUS

## 2021-03-08 MED ORDER — BUDESONIDE 0.25 MG/2ML IN SUSP
0.2500 mg | Freq: Two times a day (BID) | RESPIRATORY_TRACT | Status: DC
  Administered 2021-03-08 – 2021-03-16 (×16): 0.25 mg via RESPIRATORY_TRACT
  Filled 2021-03-08 (×16): qty 2

## 2021-03-08 MED ORDER — HYDROCOD POLST-CPM POLST ER 10-8 MG/5ML PO SUER
5.0000 mL | Freq: Once | ORAL | Status: AC
  Administered 2021-03-08: 5 mL via ORAL

## 2021-03-08 MED ORDER — FENTANYL CITRATE (PF) 100 MCG/2ML IJ SOLN
25.0000 ug | Freq: Once | INTRAMUSCULAR | Status: AC
  Administered 2021-03-08: 25 ug via INTRAVENOUS
  Filled 2021-03-08: qty 2

## 2021-03-08 MED ORDER — INSULIN ASPART 100 UNIT/ML IJ SOLN
0.0000 [IU] | Freq: Every day | INTRAMUSCULAR | Status: DC
  Administered 2021-03-08: 4 [IU] via SUBCUTANEOUS
  Administered 2021-03-09 – 2021-03-10 (×2): 2 [IU] via SUBCUTANEOUS

## 2021-03-08 NOTE — ED Notes (Signed)
Pt reports decreased PO intake and decreased output over the past week with increased fatigue. States that she started feeling "bad" about a week ago.

## 2021-03-08 NOTE — ED Notes (Signed)
ED Provider at bedside. 

## 2021-03-08 NOTE — H&P (Signed)
History and Physical  Laura Sherman ASN:[id number] DOB: [birth year] DOA: Mar 10, 2021  PCP: Reita Cliche, MD   Chief Complaint: poor appetite  HPI:  70 year old woman PMH breast cancer, cervical or uterine cancer, diabetes mellitus type 2, hypertension presented to the emergency department with 1 week history of failure to thrive, anorexia, fatigue.  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  Transferred to [facility] for further evaluation.  Doing well until about a week ago when she developed failure to thrive, fatigue, increased sleepiness.  Very poor appetite with minimal oral intake.  No specific aggravating or alleviating factors.  Has had a cough which she thought had been from allergies.  Associated with very little urination.  Did have a fall several days ago onto knees.  Follows yearly with her oncologist in [facility] and has had good checkups.  ED Course: Given Tussionex, D50, fentanyl, IV insulin, bicarbonate, calcium gluconate  Review of Systems:  Negative for fever, visual changes, sore throat, rash, new muscle aches (except knees from fall), chest pain, SOB, dysuria, bleeding, n/v/abdominal pain.  Past Medical History:  Diagnosis Date  . Arthritis   . Asthma   . Breast cancer ([place]) 2005   chemo, XRT  . Diabetes mellitus without complication ([place])   . Environmental allergies   . GERD (gastroesophageal reflux disease)   . Hypertension   . Uterine cancer ([place]) 2010   total hysterectomy, no chemo or XRT    Past Surgical History:  Procedure Laterality Date  . ABDOMINAL HYSTERECTOMY    . BACK SURGERY    . BREAST SURGERY  2005   chemo, XRT  . TONSILLECTOMY       reports that she has never smoked. She has never used smokeless tobacco. She reports that she does not drink alcohol and does not use drugs. Mobility: Ambulatory  Allergies  Allergen Reactions  . Erythromycin Nausea Only     Other Reactions: GI Upset Other Reactions: GI Upset   . Penicillins Rash  . Sulfa Antibiotics Nausea Only    Other Reactions: GI Upset  . Tape Rash    Bandaid    Family History  Problem Relation Age of Onset  . COPD Mother      Prior to Admission medications   Medication Sig Start Date End Date Taking? Authorizing Provider  amLODipine (NORVASC) 10 MG tablet Take 10 mg by mouth daily.   Yes [provider]  atorvastatin (LIPITOR) 20 MG tablet Take 20 mg by mouth daily.   Yes [provider]  budesonide (PULMICORT) 180 MCG/ACT inhaler Inhale into the lungs 2 (two) times daily.   Yes [provider]  diclofenac (VOLTAREN) 75 MG EC tablet Take 75 mg by mouth 2 (two) times daily.   Yes [provider]  fluticasone (FLOVENT DISKUS) 50 MCG/BLIST diskus inhaler Inhale 1 puff into the lungs 2 (two) times daily.   Yes [provider]  Hylan (SYNVISC IX) Inject into the articular space.   Yes [provider]  loratadine (CLARITIN) 10 MG tablet Take 10 mg by mouth daily.   Yes [provider]  losartan-hydrochlorothiazide (HYZAAR) 100-25 MG tablet Take 1 tablet by mouth daily.   Yes [provider]  metFORMIN (GLUCOPHAGE) 500 MG tablet Take by mouth 2 (two) times daily with a meal.   Yes [provider]  metoprolol (LOPRESSOR) 100 MG tablet Take 100 mg by mouth 2 (two) times daily.   Yes [provider]  omeprazole (PRILOSEC) 10 MG capsule Take 10 mg by mouth daily.   Yes [provider]  pioglitazone (ACTOS) 30 MG tablet Take 30 mg by mouth daily.   Yes [provider]    Physical Exam: Vitals:   02/14/2021 1700 03/07/2021 1802  BP: 110/60 135/78  Pulse: 91 83  Resp: (!) 21 20  Temp:  97.9 F (36.6 C)  SpO2: 93% 92%    Constitutional:   . Appears calm and comfortable Eyes:  . pupils and irises appear normal . Normal lids and conjunctivae . Scleral icterus ENMT:  . grossly  normal hearing  . Lips appear normal Neck:  . neck appears normal, no masses . no thyromegaly Respiratory:  . CTA bilaterally, no w/r/r.  . Respiratory effort normal.  Cardiovascular:  . RRR, no m/r/g . 1-2+ LLE extremity edema, 1+ RLE edema   Abdomen:  . Abdomen obese; no tenderness or masses . No hernias noted Musculoskeletal:  . RUE, LUE, RLE, LLE   . Appear grossly normal Skin:  . No rashes, lesions, ulcers . palpation of skin: no induration or nodules . Jaundice noted Psychiatric:  . Mental status o Mood, affect appropriate . judgment and insight appear intact    I have personally reviewed following labs and imaging studies  Labs:  Potassium 6.5 > 6.1 Creatinine 2.26, BUN 61 Calcium 11.2 Alkaline phosphatase 1459 AST 291, ALT 323, total bilirubin 14.3 Troponins mildly elevated 30, 29 Lactic acid within normal limits Hemoglobin 10.4 remainder CBC unremarkable INR elevated 2.5, not on anticoagulation Glucose elevated 427, anion gap within normal limits COVID-negative Alcohol level equivocal, patient denies drinking  Imaging studies:   Right knee film osteoarthritis, no acute traumatic finding.  CT chest abdomen and pelvis multiple pulmonary nodules consistent with metastatic disease, hepatic lesions concerning for metastatic disease, pancreatic head low-density 2.7 cm concerning for possible pancreatic malignancy  Medical tests:   EKG independently reviewed: Sinus rhythm, left anterior fascicular block, no old for comparison.  No acute changes seen.  Principal Problem:   Obstructive jaundice due to malignant neoplasm (HCC) Active Problems:   AKI (acute kidney injury) ([place])   Hyperkalemia   DM type 2 (diabetes mellitus, type 2) (HCC)   Coagulopathy (HCC)   Aortic atherosclerosis (HCC)   Hypercalcemia   Assessment/Plan Jaundice, elevated LFTs, abnormal CT concerning for pancreatic malignancy with associated obstructive jaundice --Can allow liquids  for now, n.p.o. after midnight, GI to see tomorrow for consideration of intervention and stenting -- Labs in a.m.  Abnormal CT with pancreatic lesion, multiple lung nodules and multiple ill-defined areas in the liver concerning for metastatic disease. -- We will discuss with GI, if EUS biopsy is possible we will pursue this if not we will ask radiology for consideration of liver biopsy -- MRI or CT with IV contrast tomorrow based on renal function -- PMH of breast cancer and uterine cancer also noted  Acute kidney injury secondary to poor oral intake, prerenal azotemia.  No cardiac history. -- Aggressive IV fluids, trend CMP  Hyperkalemia secondary to acute kidney injury -- IV fluids, trend potassium  Diabetes mellitus type 2 with hyperglycemia -- Anion gap within normal limits.  Aggressive IV fluids.  Sliding scale insulin.  Monitor closely.  Hypercalcemia, concerning given suspected malignancy -- Asymptomatic except for anorexia.  IV fluids and check calcium in a.m.  Hyponatremia secondary to poor oral intake, corrected 131 -- IV fluids most likely will correct.  Mild and asymptomatic.  Coagulopathy, most  likely secondary to metastatic liver disease -- Check coags in a.m.  Trivial troponin elevation -- Troponins flat, no further evaluation suggested.  Unclear why checked, no signs or symptoms to suggest ACS.  EKG nonacute.   Aortic atherosclerosis -- No treatment indicated  Severity of Illness: The appropriate patient status for this patient is INPATIENT. Inpatient status is judged to be reasonable and necessary in order to provide the required intensity of service to ensure the patient's safety. The patient's presenting symptoms, physical exam findings, and initial radiographic and laboratory data in the context of their chronic comorbidities is felt to place them at high risk for further clinical deterioration. Furthermore, it is not anticipated that the patient will be medically  stable for discharge from the hospital within 2 midnights of admission. The following factors support the patient status of inpatient.   " The patient's presenting symptoms include weakness, anorexia. " The worrisome physical exam findings include jaundice. " The initial radiographic and laboratory data are worrisome because of obstructive jaundice, hyperbilirubinemia, abnormal LFTs, coagulopathy, acute kidney injury, hyperkalemia. " The chronic co-morbidities include PMH breast cancer, uterine cancer.   * I certify that at the point of admission it is my clinical judgment that the patient will require inpatient hospital care spanning beyond 2 midnights from the point of admission due to high intensity of service, high risk for further deterioration and high frequency of surveillance required.*  DVT prophylaxis:SCDs Code Status: DNR per patient Family Communication: daughter at bedside Consults called: GI    Time spent: 58 minutes  Murray Hodgkins, MD  [facility] Direct contact: see [email or web address]  7PM-7AM contact night coverage as below   1. Check the care team in [facility] and look for a) attending/consulting [facility] provider listed and b) the [facility] team listed 2. Log into [email or web address] and use [place]'s universal password to access. If you do not have the password, please contact the hospital operator. 3. Locate the [facility] provider you are looking for under [facility] and page to a number that you can be directly reached. 4. If you still have difficulty reaching the provider, please page the [facility] (Director on Call) for the Hospitalists listed on amion for assistance.   03/06/2021, 7:18 PM

## 2021-03-08 NOTE — ED Triage Notes (Signed)
Pt via pov from home with weakness; states she has been feeling weak for one week and that she noticed her urine was dark one week ago as well.  Pt also reports a fall 3 days ago resulting in knee and back pain. Pt states she had bronchitis 2 months ago and has not shaken the cough. Pt coughing often during triage.  Pt's skin appears yellow.

## 2021-03-08 NOTE — ED Notes (Signed)
CRITICAL VALUE STICKER  CRITICAL VALUE: K+ 6.5  RECEIVER (on-site recipient of call):Aylen Stradford  DATE & TIME NOTIFIED: 02/17/2021 @ 11:51  MD NOTIFIED: MD Joya Gaskins  TIME OF NOTIFICATION:1151 via Secure Chat  RESPONSE: See orders

## 2021-03-08 NOTE — ED Notes (Signed)
White MD aware of K+ of 6.5, speaking with patient family at this time .

## 2021-03-08 NOTE — ED Provider Notes (Signed)
[name] EMERGENCY DEPT Provider Note   CSN: [id number] Arrival date & time: 03/02/2021  1035     History Chief Complaint  Patient presents with  . Weakness    Laura Sherman is a 70 y.o. female.  Laura Sherman with progressive weakness over about a week.  She also has had anorexia and nausea.  She has had decreased oral intake as a result.  Several days ago, she also fell while attempting to reach for her purse.  She hit both her knees.  She has a remote history of breast cancer.  Her daughter saw her today, noticed that she was quite jaundiced, and brought her in for evaluation.  The history is provided by the patient.  Weakness Severity:  Severe Onset quality:  Gradual Duration:  1 week Timing:  Constant Progression:  Worsening Chronicity:  New Context: not change in medication and not recent infection   Relieved by:  Nothing Worsened by:  Nothing Ineffective treatments:  None tried Associated symptoms: anorexia, cough, falls, lethargy and nausea   Associated symptoms: no abdominal pain, no arthralgias, no chest pain, no diarrhea, no dysuria, no fever, no seizures, no shortness of breath and no vomiting        Past Medical History:  Diagnosis Date  . Arthritis   . Asthma   . Cancer ([place])   . Diabetes mellitus without complication ([place])   . Environmental allergies   . GERD (gastroesophageal reflux disease)   . Hypertension     There are no problems to display for this patient.   Past Surgical History:  Procedure Laterality Date  . ABDOMINAL HYSTERECTOMY    . BACK SURGERY    . BREAST SURGERY    . TONSILLECTOMY       OB History   No obstetric history on file.     History reviewed. No pertinent family history.  Social History   Tobacco Use  . Smoking status: Never Smoker  . Smokeless tobacco: Never Used  Vaping Use  . Vaping Use: Never used  Substance Use Topics  . Alcohol use: No  . Drug use: No    Home  Medications Prior to Admission medications   Medication Sig Start Date End Date Taking? Authorizing Provider  amLODipine (NORVASC) 10 MG tablet Take 10 mg by mouth daily.   Yes [provider]  atorvastatin (LIPITOR) 20 MG tablet Take 20 mg by mouth daily.   Yes [provider]  budesonide (PULMICORT) 180 MCG/ACT inhaler Inhale into the lungs 2 (two) times daily.   Yes [provider]  diclofenac (VOLTAREN) 75 MG EC tablet Take 75 mg by mouth 2 (two) times daily.   Yes [provider]  fluticasone (FLOVENT DISKUS) 50 MCG/BLIST diskus inhaler Inhale 1 puff into the lungs 2 (two) times daily.   Yes [provider]  Hylan (SYNVISC IX) Inject into the articular space.   Yes [provider]  loratadine (CLARITIN) 10 MG tablet Take 10 mg by mouth daily.   Yes [provider]  losartan-hydrochlorothiazide (HYZAAR) 100-25 MG tablet Take 1 tablet by mouth daily.   Yes [provider]  metFORMIN (GLUCOPHAGE) 500 MG tablet Take by mouth 2 (two) times daily with a meal.   Yes [provider]  metoprolol (LOPRESSOR) 100 MG tablet Take 100 mg by mouth 2 (two) times daily.   Yes [provider]  omeprazole (PRILOSEC) 10 MG capsule Take 10 mg by mouth daily.   Yes [provider]  pioglitazone (ACTOS) 30 MG tablet Take 30 mg by mouth daily.   Yes [provider]    Allergies    Penicillins  Review of Systems   Review of Systems  Constitutional: Positive for appetite change and fatigue. Negative for chills and fever.  HENT: Negative for ear pain and sore throat.   Eyes: Negative for pain and visual disturbance.  Respiratory: Positive for cough. Negative for shortness of breath.   Cardiovascular: Negative for chest pain and palpitations.  Gastrointestinal: Positive for anorexia and nausea. Negative for abdominal pain, diarrhea and vomiting.  Genitourinary: Negative for dysuria and hematuria.   Musculoskeletal: Positive for falls. Negative for arthralgias and back pain.  Skin: Positive for color change. Negative for rash.  Neurological: Positive for weakness. Negative for seizures and syncope.  All other systems reviewed and are negative.   Physical Exam Updated Vital Signs BP 110/72 (BP Location: Left Arm)   Pulse 69   Temp 98.2 F (36.8 C) (Oral)   Resp (!) 30   Ht 5\' 3"  (1.6 m)   Wt 98 kg   SpO2 99%   BMI 38.26 kg/m   Physical Exam Vitals and nursing note reviewed.  Constitutional:      General: She is not in acute distress.    Appearance: She is well-developed. She is ill-appearing.     Comments: Listless, eyes closed most of the interview and exam  HENT:     Head: Normocephalic and atraumatic.  Eyes:     General: Scleral icterus present.     Conjunctiva/sclera: Conjunctivae normal.  Cardiovascular:     Rate and Rhythm: Normal rate and regular rhythm.     Heart sounds: No murmur heard.   Pulmonary:     Effort: Pulmonary effort is normal. No respiratory distress.     Breath sounds: Normal breath sounds.     Comments: tachypnea Abdominal:     Palpations: Abdomen is soft.     Tenderness: There is no abdominal tenderness.  Musculoskeletal:     Cervical back: Neck supple.     Comments: Mild bruising on both anterior knees, the right greater than the left.  The right knee has limited motion secondary to pain and tenderness at the medial joint line without a joint effusion.  Skin:    General: Skin is warm and dry.     Coloration: Skin is jaundiced.  Neurological:     Mental Status: She is alert.     Comments: Weak and requires assistance to sit  Psychiatric:        Mood and Affect: Mood normal.     ED Results / Procedures / Treatments   Labs (all labs ordered are listed, but only abnormal results are displayed) Labs Reviewed  COMPREHENSIVE METABOLIC PANEL - Abnormal; Notable for the following components:      Result Value   Sodium 126 (*)     Potassium 6.5 (*)    Chloride 97 (*)    CO2 17 (*)    Glucose, Bld 323 (*)    BUN 62 (*)    Creatinine, Ser 2.38 (*)    Albumin 3.2 (*)    AST 348 (*)    ALT 381 (*)    Alkaline Phosphatase 1,644 (*)    Total Bilirubin 16.0 (*)    GFR, Estimated 21 (*)    All other components within normal limits  ACETAMINOPHEN LEVEL - Abnormal; Notable for the following components:   Acetaminophen (Tylenol), Serum <10 (*)  All other components within normal limits  ETHANOL - Abnormal; Notable for the following components:   Alcohol, Ethyl (B) 12 (*)    All other components within normal limits  LIPASE, BLOOD - Abnormal; Notable for the following components:   Lipase 182 (*)    All other components within normal limits  CBC WITH DIFFERENTIAL/PLATELET - Abnormal; Notable for the following components:   RBC 3.61 (*)    Hemoglobin 10.4 (*)    HCT 31.3 (*)    RDW 16.4 (*)    Neutro Abs 8.8 (*)    Lymphs Abs 0.4 (*)    All other components within normal limits  PROTIME-INR - Abnormal; Notable for the following components:   Prothrombin Time 27.0 (*)    INR 2.5 (*)    All other components within normal limits  COMPREHENSIVE METABOLIC PANEL - Abnormal; Notable for the following components:   Sodium 126 (*)    Potassium 6.1 (*)    CO2 19 (*)    Glucose, Bld 427 (*)    BUN 61 (*)    Creatinine, Ser 2.26 (*)    Calcium 11.2 (*)    Total Protein 6.0 (*)    Albumin 2.9 (*)    AST 291 (*)    ALT 323 (*)    Alkaline Phosphatase 1,459 (*)    Total Bilirubin 14.3 (*)    GFR, Estimated 23 (*)    All other components within normal limits  TROPONIN I (HIGH SENSITIVITY) - Abnormal; Notable for the following components:   Troponin I (High Sensitivity) 30 (*)    All other components within normal limits  TROPONIN I (HIGH SENSITIVITY) - Abnormal; Notable for the following components:   Troponin I (High Sensitivity) 29 (*)    All other components within normal limits  RESP PANEL BY RT-PCR (FLU A&B,  COVID) [id number]  LACTIC ACID, PLASMA  LACTIC ACID, PLASMA  URINALYSIS, ROUTINE W REFLEX MICROSCOPIC  HEPATITIS PANEL, ACUTE    EKG EKG Interpretation  Date/Time:  Thursday March 08 2021 10:41:40 EDT Ventricular Rate:  71 PR Interval:  108 QRS Duration: 117 QT Interval:  402 QTC Calculation: 437 R Axis:   -63 Text Interpretation: Sinus rhythm Short PR interval Left anterior fascicular block no acute ischemia no priors for comparison Confirmed by Lorre Munroe ([id number]) on 03/01/2021 11:18:56 AM   Radiology DG Chest Port 1 View  Result Date: 03/07/2021 CLINICAL DATA:  Cough.  Fatigue. EXAM: PORTABLE CHEST 1 VIEW COMPARISON:  Chest x-ray 12/24/2015. FINDINGS: Mediastinum and hilar structures normal. Cardiomegaly. No pulmonary venous congestion. Pulmonary nodular opacities are noted bilaterally. An infectious process or metastatic disease cannot be excluded. Contrast-enhanced CT of the chest suggested for further evaluation. Mild bibasilar atelectasis. No pleural effusion or pneumothorax. Degenerative change thoracic spine. Surgical clips right chest. IMPRESSION: Nodular opacities are noted over both lungs. An infectious process or metastatic disease cannot be excluded. Contrast-enhanced CT of the chest suggested for further evaluation. Electronically Signed   By: Marcello Moores  Register   On: 02/16/2021 11:50   DG Knee Right Port  Result Date: 02/11/2021 CLINICAL DATA:  Fall with knee pain EXAM: PORTABLE RIGHT KNEE - 1-2 VIEW COMPARISON:  12/24/2015 FINDINGS: Small knee joint effusion. Advanced degenerative change of the patellofemoral joint. Degenerative change also of the weight-bearing compartments with marginal osteophytes, worse lateral than medial. Multiple intra-articular loose bodies. No acute traumatic finding. IMPRESSION: No acute traumatic finding. 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 Small joint effusion. Electronically Signed  By: Nelson Chimes M.D.   On:  03/02/2021 11:58   CT CHEST ABDOMEN PELVIS WO CONTRAST  Result Date: 02/19/2021 CLINICAL DATA:  Weakness.  Recent fall. EXAM: CT CHEST, ABDOMEN AND PELVIS WITHOUT CONTRAST TECHNIQUE: Multidetector CT imaging of the chest, abdomen and pelvis was performed following the standard protocol without IV contrast. COMPARISON:  None. FINDINGS: CT CHEST FINDINGS Cardiovascular: Atherosclerosis of thoracic aorta is noted without aneurysm formation. Normal cardiac size. No pericardial effusion. Coronary artery calcifications are noted. Mediastinum/Nodes: Thyroid gland is unremarkable. Esophagus is unremarkable. 2 cm subcarinal lymph node is noted. 1.5 cm right paratracheal lymph node is noted. Lungs/Pleura: No pneumothorax or pleural effusion is noted. Multiple nodules are noted throughout both lungs concerning for metastatic disease. The largest measures 1.9 cm in left lower lobe. Musculoskeletal: No chest wall mass or suspicious bone lesions identified. CT ABDOMEN PELVIS FINDINGS Hepatobiliary: Cholelithiasis is noted. Multiple ill-defined hypoechoic areas are noted throughout the liver concerning for metastatic disease. The largest measures 5.2 x 4.3 cm in the anterior portion of the right hepatic lobe. Mild intrahepatic biliary dilatation is noted concerning for obstruction of the distal common bile duct which may be due to possible pancreatic head mass measuring 2.7 cm. Pancreas: As noted above, possible low density is noted in the pancreatic head which is ill-defined and measures approximately 2.7 cm, concerning for possible pancreatic malignancy. Spleen: Normal in size without focal abnormality. Adrenals/Urinary Tract: Adrenal glands are unremarkable. Kidneys are normal, without renal calculi, focal lesion, or hydronephrosis. Bladder is unremarkable. Stomach/Bowel: Stomach is within normal limits. Appendix appears normal. No evidence of bowel wall thickening, distention, or inflammatory changes. Vascular/Lymphatic:  Atherosclerosis of abdominal aorta is noted without aneurysm formation. Periaortic adenopathy is noted concerning for metastatic disease. The largest lymph node measures 9 mm in minor axis. Reproductive: Status post hysterectomy. No adnexal masses. Other: Minimal free fluid is noted in the pelvis. No definite hernia is noted. Musculoskeletal: No acute or significant osseous findings. IMPRESSION: Multiple pulmonary nodules are noted consistent with metastatic disease. Multiple ill-defined hypoechoic areas are noted in the hepatic parenchyma concerning for metastatic disease. The largest such lesion measures 5.2 cm in the right hepatic lobe. Possible well-defined low density seen in pancreatic head which measures 2.7 cm and is concerning for possible pancreatic malignancy. Further evaluation with MRI or CT scan with intravenous contrast is recommended. Mild intrahepatic biliary dilatation is noted which may be due to obstruction secondary to this mass. Periaortic adenopathy is noted concerning for metastatic disease. Coronary artery calcifications are noted suggesting coronary artery disease. Cholelithiasis. Aortic Atherosclerosis (ICD10-I70.0). Electronically Signed   By: Marijo Conception M.D.   On: 02/28/2021 12:55    Procedures .Critical Care Performed by: Arnaldo Natal, MD Authorized by: Arnaldo Natal, MD   Critical care provider statement:    Critical care time (minutes):  45   Critical care time was exclusive of:  Separately billable procedures and treating other patients and teaching time   Critical care was necessary to treat or prevent imminent or life-threatening deterioration of the following conditions:  Hepatic failure, metabolic crisis and renal failure   Critical care was time spent personally by me on the following activities:  Discussions with consultants, evaluation of patient's response to treatment, examination of patient, ordering and performing treatments and interventions, ordering  and review of laboratory studies, ordering and review of radiographic studies, pulse oximetry, re-evaluation of patient's condition, obtaining history from patient or surrogate, review of old charts and development of  treatment plan with patient or surrogate   I assumed direction of critical care for this patient from another provider in my specialty: no     Care discussed with: admitting provider       Medications Ordered in ED Medications  sodium chloride 0.9 % bolus 500 mL (500 mLs Intravenous New Bag/Given 03/10/2021 1216)    ED Course  I have reviewed the triage vital signs and the nursing notes.  Pertinent labs & imaging results that were available during my care of the patient were reviewed by me and considered in my medical decision making (see chart for details).  Clinical Course as of 02/15/2021 1410  Thu Mar 08, 2021  1158 CT CHEST ABDOMEN PELVIS WO CONTRAST [AW]  [id number] I spoke with Dr. Marylyn Ishihara of the hospitalist service who will admit the patient.  He request that I speak to GI about possible ERCP. [AW]  [id number] I notified GI on call. They will see her when she arrives. [AW]    Clinical Course User Index [AW] Arnaldo Natal, MD   MDM Rules/Calculators/A&P                          Eustace Quail Bruss presented with anorexia and fatigue.  She was notably jaundiced, and she was evaluated for evidence of an infectious etiology, obstructive etiology, or toxic/metabolic etiology of liver failure. She was found to have acute kidney failure and acute liver failure. She has a pancreatic mass which is likely malignant as she has evidence of metastatic disease. She was given temporizing treatment for hyperkalemia due to AKI. She will be transferred for further evaluation and treatment. Final Clinical Impression(s) / ED Diagnoses Final diagnoses:  Malignant neoplasm of pancreas, unspecified location of malignancy ([place])  Acute liver failure without hepatic coma  Acute kidney injury ([place])  Malignant  neoplasm metastatic to both lungs [employer])    Rx / DC Orders ED Discharge Orders    None       Arnaldo Natal, MD 02/18/2021 1558

## 2021-03-08 NOTE — Progress Notes (Addendum)
Notified by EDP of need for admission d/t biliary obstruction. [facility] accepts patient to telemetry bed at [facility]. EDP is to remain responsible for orders/medical decisions while patient is holding at [facility] [facility]. Upon arrival to [facility], [facility] will assume care. Nursing staff will call flow manager/carelink to notify them of patient's arrival so that the proper [facility] member may receive the patient. Nursing staff will notify the following consultants, nephrology and [facility], of patient's arrival for their evaluation. Thank you.

## 2021-03-09 ENCOUNTER — Inpatient Hospital Stay (HOSPITAL_COMMUNITY): Payer: Medicare Other

## 2021-03-09 DIAGNOSIS — D689 Coagulation defect, unspecified: Secondary | ICD-10-CM | POA: Diagnosis not present

## 2021-03-09 DIAGNOSIS — E1165 Type 2 diabetes mellitus with hyperglycemia: Secondary | ICD-10-CM | POA: Diagnosis not present

## 2021-03-09 DIAGNOSIS — K831 Obstruction of bile duct: Secondary | ICD-10-CM | POA: Diagnosis not present

## 2021-03-09 DIAGNOSIS — N179 Acute kidney failure, unspecified: Secondary | ICD-10-CM | POA: Diagnosis not present

## 2021-03-09 LAB — COMPREHENSIVE METABOLIC PANEL
ALT: 338 U/L — ABNORMAL HIGH (ref 0–44)
AST: 348 U/L — ABNORMAL HIGH (ref 15–41)
Albumin: 2.4 g/dL — ABNORMAL LOW (ref 3.5–5.0)
Alkaline Phosphatase: 1522 U/L — ABNORMAL HIGH (ref 38–126)
Anion gap: 9 (ref 5–15)
BUN: 58 mg/dL — ABNORMAL HIGH (ref 8–23)
CO2: 19 mmol/L — ABNORMAL LOW (ref 22–32)
Calcium: 8.8 mg/dL — ABNORMAL LOW (ref 8.9–10.3)
Chloride: 101 mmol/L (ref 98–111)
Creatinine, Ser: 1.66 mg/dL — ABNORMAL HIGH (ref 0.44–1.00)
GFR, Estimated: 33 mL/min — ABNORMAL LOW (ref >60)
Glucose, Bld: 238 mg/dL — ABNORMAL HIGH (ref 70–99)
Potassium: 5 mmol/L (ref 3.5–5.1)
Sodium: 129 mmol/L — ABNORMAL LOW (ref 135–145)
Total Bilirubin: 15.2 mg/dL — ABNORMAL HIGH (ref 0.3–1.2)
Total Protein: 6.7 g/dL (ref 6.5–8.1)

## 2021-03-09 LAB — CBC
HCT: 29.4 % — ABNORMAL LOW (ref 36.0–46.0)
Hemoglobin: 9.8 g/dL — ABNORMAL LOW (ref 12.0–15.0)
MCH: 29.3 pg (ref 26.0–34.0)
MCHC: 33.3 g/dL (ref 30.0–36.0)
MCV: 87.8 fL (ref 80.0–100.0)
Platelets: 249 10*3/uL (ref 150–400)
RBC: 3.35 MIL/uL — ABNORMAL LOW (ref 3.87–5.11)
RDW: 16.7 % — ABNORMAL HIGH (ref 11.5–15.5)
WBC: 9.4 10*3/uL (ref 4.0–10.5)
nRBC: 0 % (ref 0.0–0.2)

## 2021-03-09 LAB — HEMOGLOBIN A1C
Hgb A1c MFr Bld: 8 % — ABNORMAL HIGH (ref 4.8–5.6)
Mean Plasma Glucose: 182.9 mg/dL

## 2021-03-09 LAB — GLUCOSE, CAPILLARY
Glucose-Capillary: 265 mg/dL — ABNORMAL HIGH (ref 70–99)
Glucose-Capillary: 267 mg/dL — ABNORMAL HIGH (ref 70–99)
Glucose-Capillary: 242 mg/dL — ABNORMAL HIGH (ref 70–99)
Glucose-Capillary: 207 mg/dL — ABNORMAL HIGH (ref 70–99)

## 2021-03-09 LAB — APTT: aPTT: 42 seconds — ABNORMAL HIGH (ref 24–36)

## 2021-03-09 LAB — MAGNESIUM: Magnesium: 1.8 mg/dL (ref 1.7–2.4)

## 2021-03-09 LAB — POTASSIUM
Potassium: 5.6 mmol/L — ABNORMAL HIGH (ref 3.5–5.1)
Potassium: 5.1 mmol/L (ref 3.5–5.1)
Potassium: 5.1 mmol/L (ref 3.5–5.1)

## 2021-03-09 LAB — PROTIME-INR
INR: 2.8 — ABNORMAL HIGH (ref 0.8–1.2)
Prothrombin Time: 29.8 seconds — ABNORMAL HIGH (ref 11.4–15.2)

## 2021-03-09 LAB — PHOSPHORUS: Phosphorus: 2.7 mg/dL (ref 2.5–4.6)

## 2021-03-09 MED ORDER — GADOBUTROL 1 MMOL/ML IV SOLN
10.0000 mL | Freq: Once | INTRAVENOUS | Status: AC | PRN
  Administered 2021-03-09: 10 mL via INTRAVENOUS

## 2021-03-09 MED ORDER — GUAIFENESIN ER 600 MG PO TB12
600.0000 mg | ORAL_TABLET | Freq: Two times a day (BID) | ORAL | Status: DC
  Administered 2021-03-09 – 2021-03-19 (×17): 600 mg via ORAL
  Filled 2021-03-09 (×19): qty 1

## 2021-03-09 MED ORDER — MAGNESIUM SULFATE 2 GM/50ML IV SOLN
2.0000 g | Freq: Once | INTRAVENOUS | Status: AC
  Administered 2021-03-09: 2 g via INTRAVENOUS
  Filled 2021-03-09: qty 50

## 2021-03-09 MED ORDER — METOPROLOL TARTRATE 5 MG/5ML IV SOLN
5.0000 mg | Freq: Once | INTRAVENOUS | Status: DC
  Filled 2021-03-09: qty 5

## 2021-03-09 MED ORDER — INSULIN GLARGINE 100 UNIT/ML ~~LOC~~ SOLN
5.0000 [IU] | Freq: Every day | SUBCUTANEOUS | Status: DC
  Administered 2021-03-09 – 2021-03-10 (×2): 5 [IU] via SUBCUTANEOUS
  Filled 2021-03-09 (×2): qty 0.05

## 2021-03-09 MED ORDER — LORAZEPAM 1 MG PO TABS
1.0000 mg | ORAL_TABLET | Freq: Once | ORAL | Status: AC
  Administered 2021-03-09: 1 mg via ORAL
  Filled 2021-03-09: qty 1

## 2021-03-09 NOTE — Consult Note (Signed)
Referring Provider: Dr. Sarajane Jews Primary Care Physician:  Reita Cliche, MD Primary Gastroenterologist:  Althia Forts [facility])  Reason for Consultation:  Obstructive jaundice  HPI: Laura Sherman is a 70 y.o. 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  Patient reports fatigue and weakness over the last 2 weeks.  She states she was unable to stay awake.  She noticed dark urine about a week ago.  She did not realize she was jaundiced, though her daughter noted jaundice earlier this week.  She reports decreased appetite over the last couple of weeks and states she has lost 15 pounds in the last month or so.  She denies any fever/chills, abdominal pain, nausea, vomiting.  Denies changes in bowel habits, melena, or hematochezia.  Family history pertinent for paternal uncle with pancreatic cancer.  Otherwise, no known family members with gastrointestinal malignancy.  She occasionally takes 81 mg aspirin but denies other NSAID or blood thinner use.  Past Medical History:  Diagnosis Date  . Arthritis   . Asthma   . Breast cancer ([place]) 2005   chemo, XRT  . Diabetes mellitus without complication ([place])   . Environmental allergies   . GERD (gastroesophageal reflux disease)   . Hypertension   . Uterine cancer ([place]) 2010   total hysterectomy, no chemo or XRT    Past Surgical History:  Procedure Laterality Date  . ABDOMINAL HYSTERECTOMY    . BACK SURGERY    . BREAST SURGERY  2005   chemo, XRT  . TONSILLECTOMY      Prior to Admission medications   Medication Sig Start Date End Date Taking? Authorizing Provider  acetaminophen (TYLENOL) 650 MG CR tablet Take 650 mg by mouth every 8 (eight) hours as needed for pain.   Yes [provider]  albuterol (VENTOLIN HFA) 108 (90 Base) MCG/ACT  inhaler Inhale 2 puffs into the lungs every 6 (six) hours as needed for wheezing or shortness of breath.   Yes [provider]  amLODipine (NORVASC) 10 MG tablet Take 10 mg by mouth daily.   Yes [provider]  atorvastatin (LIPITOR) 20 MG tablet Take 20 mg by mouth daily.   Yes [provider]  B Complex Vitamins (VITAMIN B COMPLEX PO) Take 1 tablet by mouth daily.   Yes [provider]  calcium carbonate (OSCAL) 1500 (600 Ca) MG TABS tablet Take 600 mg of elemental calcium by mouth daily.   Yes [provider]  diclofenac (VOLTAREN) 75 MG EC tablet Take 75 mg by mouth 2 (two) times daily.   Yes [provider]  ergocalciferol (VITAMIN D2) 1.25 MG (50000 UT) capsule Take 50,000 Units by mouth every 14 (fourteen) days. 08/12/16  Yes [provider]  fexofenadine (ALLEGRA) 60 MG tablet Take 60 mg by mouth daily.   Yes [provider]  fluticasone (FLONASE) 50 MCG/ACT nasal spray Place 1 spray into both nostrils daily.   Yes [provider]  fluticasone (FLOVENT DISKUS) 50 MCG/BLIST diskus inhaler Inhale 1 puff into the lungs 2 (two) times daily.   Yes [provider]  glipiZIDE (GLUCOTROL) 5 MG tablet Take 2.5 mg by mouth daily. 02/20/21  Yes [provider]  hydrochlorothiazide (HYDRODIURIL) 25 MG tablet Take 25 mg by mouth daily.   Yes [provider]  loratadine (CLARITIN) 10 MG tablet Take 10 mg  by mouth daily.   Yes [provider]  losartan-hydrochlorothiazide (HYZAAR) 100-25 MG tablet Take 1 tablet by mouth daily.   Yes [provider]  metFORMIN (GLUCOPHAGE) 500 MG tablet Take by mouth 2 (two) times daily with a meal.   Yes [provider]  metoprolol (LOPRESSOR) 100 MG tablet Take 100 mg by mouth 2 (two) times daily.   Yes [provider]  montelukast (SINGULAIR) 10 MG tablet Take 10 mg by mouth daily. 02/24/21  Yes [provider]  Multiple  Vitamin (MULTIVITAMIN WITH MINERALS) TABS tablet Take 1 tablet by mouth daily.   Yes [provider]  olmesartan (BENICAR) 40 MG tablet Take 40 mg by mouth daily. 01/08/21  Yes [provider]  omeprazole (PRILOSEC) 10 MG capsule Take 10 mg by mouth daily.   Yes [provider]  oxymetazoline (AFRIN) 0.05 % nasal spray Place 1 spray into both nostrils daily as needed for congestion.   Yes [provider]  pioglitazone (ACTOS) 30 MG tablet Take 30 mg by mouth daily.   Yes [provider]  Pseudoephedrine-Guaifenesin 8025450006 MG TB12 Take 1 tablet by mouth 2 (two) times daily.   Yes [provider]  Vitamin E 100 units TABS Take 100 Units by mouth daily.   Yes [provider]    Scheduled Meds: . amLODipine  10 mg Oral Daily  . atorvastatin  20 mg Oral Daily  . budesonide  0.25 mg Nebulization BID  . insulin aspart  0-5 Units Subcutaneous QHS  . insulin aspart  0-9 Units Subcutaneous TID WC  . metoprolol tartrate  100 mg Oral BID  . pantoprazole  40 mg Oral Daily  . sodium chloride flush  3 mL Intravenous Q12H   Continuous Infusions: . sodium chloride 150 mL/hr at 03/09/21 0140   PRN Meds:.ondansetron **OR** ondansetron (ZOFRAN) IV  Allergies as of 02/21/2021 - Review Complete 02/14/2021  Allergen Reaction Noted  . Erythromycin Nausea Only 03/23/2014  . Penicillins Rash 12/24/2015  . Sulfa antibiotics Nausea Only 05/05/2014  . Tape Rash 03/07/2021    Family History  Problem Relation Age of Onset  . COPD Mother     Social History   Socioeconomic History  . Marital status: Married    Spouse name: Not on file  . Number of children: Not on file  . Years of education: Not on file  . Highest education level: Not on file  Occupational History  . Not on file  Tobacco Use  . Smoking status: Never Smoker  . Smokeless tobacco: Never Used  Vaping Use  . Vaping Use: Never used  Substance and Sexual Activity  . Alcohol  use: No  . Drug use: No  . Sexual activity: Not on file  Other Topics Concern  . Not on file  Social History Narrative  . Not on file   Social Determinants of Health   Financial Resource Strain: Not on file  Food Insecurity: Not on file  Transportation Needs: Not on file  Physical Activity: Not on file  Stress: Not on file  Social Connections: Not on file  Intimate Partner Violence: Not on file    Review of Systems: Review of Systems  Constitutional: Positive for malaise/fatigue and weight loss. Negative for chills and fever.  HENT: Negative for hearing loss and tinnitus.   Eyes: Negative for pain and redness.  Respiratory: Positive for cough. Negative for shortness of breath.   Cardiovascular: Negative for chest pain and palpitations.  Gastrointestinal: Negative  for abdominal pain, blood in stool, constipation, diarrhea, heartburn, melena, nausea and vomiting.  Genitourinary: Negative for flank pain and hematuria.  Musculoskeletal: Negative for falls and joint pain.  Skin: Negative for itching and rash.  Neurological: Negative for seizures and loss of consciousness.  Endo/Heme/Allergies: Negative for polydipsia. Does not bruise/bleed easily.  Psychiatric/Behavioral: Negative for substance abuse. The patient is not nervous/anxious.     Physical Exam: Vital signs: Vitals:   03/09/21 0624 03/09/21 0700  BP:    Pulse: 86   Resp: (!) 23   Temp:  98.1 F (36.7 C)  SpO2: 92%    Last BM Date: 03/07/2021    Physical Exam Vitals reviewed.  Constitutional:      General: She is not in acute distress. HENT:     Head: Normocephalic and atraumatic.     Nose: Nose normal. No congestion.     Mouth/Throat:     Mouth: Mucous membranes are moist.     Pharynx: Oropharynx is clear.  Eyes:     General: Scleral icterus present.     Extraocular Movements: Extraocular movements intact.  Cardiovascular:     Rate and Rhythm: Normal rate and regular rhythm.  Pulmonary:     Effort:  Pulmonary effort is normal. No respiratory distress.  Abdominal:     General: Bowel sounds are normal. There is no distension.     Palpations: Abdomen is soft.     Tenderness: There is no abdominal tenderness. There is no guarding or rebound.     Hernia: No hernia is present.  Musculoskeletal:        General: No swelling or tenderness.  Skin:    General: Skin is warm and dry.     Coloration: Skin is jaundiced.  Neurological:     General: No focal deficit present.     Mental Status: She is oriented to person, place, and time. She is lethargic.  Psychiatric:        Mood and Affect: Mood normal.        Behavior: Behavior normal. Behavior is cooperative.      GI:  Lab Results: Recent Labs    03/07/2021 1113 03/09/21 0317  WBC 10.4 9.4  HGB 10.4* 9.8*  HCT 31.3* 29.4*  PLT 282 249   BMET Recent Labs    02/25/2021 1113 02/27/2021 1431 02/19/2021 1801 02/16/2021 2259 03/09/21 0317 03/09/21 0702  NA 126* 126*  --   --  129*  --   K 6.5* 6.1*   < > 5.6* 5.0  5.1 5.1  CL 97* 98  --   --  101  --   CO2 17* 19*  --   --  19*  --   GLUCOSE 323* 427*  --   --  238*  --   BUN 62* 61*  --   --  58*  --   CREATININE 2.38* 2.26*  --   --  1.66*  --   CALCIUM 9.4 11.2*  --   --  8.8*  --    < > = values in this interval not displayed.   LFT Recent Labs    03/09/21 0317  PROT 6.7  ALBUMIN 2.4*  AST 348*  ALT 338*  ALKPHOS 1,522*  BILITOT 15.2*   PT/INR Recent Labs    02/28/2021 1113 03/09/21 0317  LABPROT 27.0* 29.8*  INR 2.5* 2.8*     Studies/Results: DG Chest Port 1 View  Result Date: 02/27/2021 CLINICAL DATA:  Cough.  Fatigue. EXAM:  PORTABLE CHEST 1 VIEW COMPARISON:  Chest x-ray 12/24/2015. FINDINGS: Mediastinum and hilar structures normal. Cardiomegaly. No pulmonary venous congestion. Pulmonary nodular opacities are noted bilaterally. An infectious process or metastatic disease cannot be excluded. Contrast-enhanced CT of the chest suggested for further evaluation. Mild  bibasilar atelectasis. No pleural effusion or pneumothorax. Degenerative change thoracic spine. Surgical clips right chest. IMPRESSION: Nodular opacities are noted over both lungs. An infectious process or metastatic disease cannot be excluded. Contrast-enhanced CT of the chest suggested for further evaluation. Electronically Signed   By: Marcello Moores  Register   On: 03/02/2021 11:50   DG Knee Right Port  Result Date: 02/27/2021 CLINICAL DATA:  Fall with knee pain EXAM: PORTABLE RIGHT KNEE - 1-2 VIEW COMPARISON:  12/24/2015 FINDINGS: Small knee joint effusion. Advanced degenerative change of the patellofemoral joint. Degenerative change also of the weight-bearing compartments with marginal osteophytes, worse lateral than medial. Multiple intra-articular loose bodies. No acute traumatic finding. IMPRESSION: No acute traumatic finding. 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 Small joint effusion. Electronically Signed   By: Nelson Chimes M.D.   On: 03/03/2021 11:58   CT CHEST ABDOMEN PELVIS WO CONTRAST  Result Date: 03/03/2021 CLINICAL DATA:  Weakness.  Recent fall. EXAM: CT CHEST, ABDOMEN AND PELVIS WITHOUT CONTRAST TECHNIQUE: Multidetector CT imaging of the chest, abdomen and pelvis was performed following the standard protocol without IV contrast. COMPARISON:  None. FINDINGS: CT CHEST FINDINGS Cardiovascular: Atherosclerosis of thoracic aorta is noted without aneurysm formation. Normal cardiac size. No pericardial effusion. Coronary artery calcifications are noted. Mediastinum/Nodes: Thyroid gland is unremarkable. Esophagus is unremarkable. 2 cm subcarinal lymph node is noted. 1.5 cm right paratracheal lymph node is noted. Lungs/Pleura: No pneumothorax or pleural effusion is noted. Multiple nodules are noted throughout both lungs concerning for metastatic disease. The largest measures 1.9 cm in left lower lobe. Musculoskeletal: No chest wall mass or suspicious bone lesions  identified. CT ABDOMEN PELVIS FINDINGS Hepatobiliary: Cholelithiasis is noted. Multiple ill-defined hypoechoic areas are noted throughout the liver concerning for metastatic disease. The largest measures 5.2 x 4.3 cm in the anterior portion of the right hepatic lobe. Mild intrahepatic biliary dilatation is noted concerning for obstruction of the distal common bile duct which may be due to possible pancreatic head mass measuring 2.7 cm. Pancreas: As noted above, possible low density is noted in the pancreatic head which is ill-defined and measures approximately 2.7 cm, concerning for possible pancreatic malignancy. Spleen: Normal in size without focal abnormality. Adrenals/Urinary Tract: Adrenal glands are unremarkable. Kidneys are normal, without renal calculi, focal lesion, or hydronephrosis. Bladder is unremarkable. Stomach/Bowel: Stomach is within normal limits. Appendix appears normal. No evidence of bowel wall thickening, distention, or inflammatory changes. Vascular/Lymphatic: Atherosclerosis of abdominal aorta is noted without aneurysm formation. Periaortic adenopathy is noted concerning for metastatic disease. The largest lymph node measures 9 mm in minor axis. Reproductive: Status post hysterectomy. No adnexal masses. Other: Minimal free fluid is noted in the pelvis. No definite hernia is noted. Musculoskeletal: No acute or significant osseous findings. IMPRESSION: Multiple pulmonary nodules are noted consistent with metastatic disease. Multiple ill-defined hypoechoic areas are noted in the hepatic parenchyma concerning for metastatic disease. The largest such lesion measures 5.2 cm in the right hepatic lobe. Possible well-defined low density seen in pancreatic head which measures 2.7 cm and is concerning for possible pancreatic malignancy. Further evaluation with MRI or CT scan with intravenous contrast is recommended. Mild intrahepatic biliary dilatation is noted which may be due to obstruction secondary  to this  mass. Periaortic adenopathy is noted concerning for metastatic disease. Coronary artery calcifications are noted suggesting coronary artery disease. Cholelithiasis. Aortic Atherosclerosis (ICD10-I70.0). Electronically Signed   By: Marijo Conception M.D.   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  CT was without contrast due to kidney function, radiology recommended MRI vs. CT with contrast for further evaluation. -T. Bili 15.2/ AST 348/ ALT 338/ ALP 1522  Coagulopathy, likely related to liver metastases: INR 2.8  AKI, improving: Cr 1.66 today  History of breast cancer and uterine adenocarcinoma  Plan: Plan for ERCP with possible stent placement once INR <2.0.  Reversal of INR not ideal, given malignancy, which can cause increased risk of blood clots.  Clear liquid diet.  MRI/MRCP today for further evaluation.  After MRI/MRCP, OK to transition to soft diet.  Continue to trend LFTs.  Continue to trend INR.  [facility] will follow.   LOS: 1 day   Salley Slaughter  PA-C 03/09/2021, 8:44 AM  Contact #  [phone]

## 2021-03-09 NOTE — Progress Notes (Signed)
PROGRESS NOTE  Laura Sherman KKX:[id number] DOB: 02/13/1951 DOA: 03/02/2021 PCP: Reita Cliche, MD  Brief History   70 year old woman PMH breast cancer, cervical or uterine cancer, diabetes mellitus type 2, hypertension presented to the emergency department with 1 week history of failure to thrive, anorexia, fatigue.  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  Transferred to [facility] for further evaluation.  A & P  Jaundice, elevated LFTs, abnormal CT concerning for pancreatic malignancy with associated obstructive jaundice -- Labs without significant change today.  GI consultation pending.  Remain NPO.  Defer imaging modality to GI.   Abnormal CT with pancreatic lesion, multiple lung nodules and multiple ill-defined areas in the liver concerning for metastatic disease. -- Imaging follow-up as per GI --Work-up per GI --PMH of endometrial carcinoma and breast cancer noted --Patient wants to follow-up with her oncologist in [facility]   Acute kidney injury secondary to poor oral intake, prerenal azotemia.  No cardiac history. -- Renal function slightly improved, voiding.  Continue IV fluids.  Hyperkalemia has resolved.  SVT --no h/o same per patient. Continue metoprolol. Check Mg and phos tomorrow.  Hyperkalemia secondary to acute kidney injury -- Resolved with treatment and IV fluids.   Diabetes mellitus type 2 with hyperglycemia hemoglobin A1c 8.0 -- Hold oral medications.  Start low-dose Lantus.   Hypercalcemia, concerning given suspected malignancy -- Resolved with hydration.   Hyponatremia secondary to poor oral intake, corrected 131 -- Modest in nature.  Slowly improving with IV fluids.   Coagulopathy, most likely secondary to metastatic liver disease -- Remains coagulopathic.  No bleeding noted.   Trivial troponin elevation -- Troponins flat, no further evaluation suggested.  Unclear why  checked, no signs or symptoms to suggest ACS.  EKG nonacute.    Aortic atherosclerosis -- No treatment indicated  PMH . Endometrial carcinoma status post robotic assisted hysterectomy and bilateral salpingo-oophorectomy in 2014. Pathology well-differentiated endometrial carcinoma with negative nodes . Breast cancer, 2006 ductal adenocarcinoma status right lumpectomy, radiation and chemo post mastectomy ER positive HER-2/neu negative completed antiestrogen therapy   Disposition Plan:  Discussion:   Status is: Inpatient  Remains inpatient appropriate because:Ongoing diagnostic testing needed not appropriate for outpatient work up, IV treatments appropriate due to intensity of illness or inability to take PO and Inpatient level of care appropriate due to severity of illness   Dispo: The patient is from: Home              Anticipated d/c is to: Home              Patient currently is not medically stable to d/c.   Difficult to place patient No  DVT prophylaxis: SCDs Start: 03/07/2021 1912   Code Status: DNR Level of care: Telemetry Family Communication: daughter at bedside  Murray Hodgkins, MD  [facility] Direct contact: see [email or web address] (further directions at bottom of note if needed) 7PM-7AM contact night coverage as at bottom of note 03/09/2021, 9:10 AM  LOS: 1 day   Significant Hospital Events   . 4/28 admit for obstructive jaundice   Consults:  . GI   Procedures:  .   Significant Diagnostic Tests:  . CT chest, abd, pelvis w/ pancreatic mass, hepatic and lung lesions concerning for metastatic disease   Micro Data:  . None    Antimicrobials:  . None   Interval History/Subjective  CC: f/u jaundice  Feels a little better today, "more human" No n/v, no  pain Breathing ok voiding  Objective   Vitals:  Vitals:   03/09/21 0624 03/09/21 0700  BP:    Pulse: 86   Resp: (!) 23   Temp:  98.1 F (36.7 C)  SpO2: 92%     Exam:  Constitutional:   . Appears  calm and comfortable, better ENMT:  . grossly normal hearing  Respiratory:  . CTA bilaterally, no w/r/r.  . Respiratory effort normal.  Cardiovascular:  . RRR, no m/r/g . No significant LE extremity edema   . Telemetry SR, brief SVT Abdomen:  . Abdomen appears normal; no tenderness or masses Skin:  . Jaundice to abdomen Psychiatric:  . Mental status o Mood, affect appropriate  I have personally reviewed the following:   Today's Data  . Potassium has normalized, 5.1 . Sodium stable 129 . BUN and creatinine mildly improved . LFTs without significant change . Hemoglobin stable  Scheduled Meds: . amLODipine  10 mg Oral Daily  . atorvastatin  20 mg Oral Daily  . budesonide  0.25 mg Nebulization BID  . insulin aspart  0-5 Units Subcutaneous QHS  . insulin aspart  0-9 Units Subcutaneous TID WC  . insulin glargine  5 Units Subcutaneous Daily  . metoprolol tartrate  100 mg Oral BID  . pantoprazole  40 mg Oral Daily  . sodium chloride flush  3 mL Intravenous Q12H   Continuous Infusions: . sodium chloride 150 mL/hr at 03/09/21 0140    Principal Problem:   Obstructive jaundice due to malignant neoplasm (HCC) Active Problems:   AKI (acute kidney injury) ([place])   Hyperkalemia   DM type 2 (diabetes mellitus, type 2) ([place])   Coagulopathy ([place])   Aortic atherosclerosis ([place])   Hypercalcemia   LOS: 1 day   How to contact the [facility] Attending or Consulting provider 7A - 7P or covering provider during after hours [place], for this patient?  1. Check the care team in [facility] and look for a) attending/consulting TR[facility] provider listed and b) the [facility] team listed 2. Log into [email or web address] and use [place]'s universal password to access. If you do not have the password, please contact the hospital operator. 3. Locate the [facility] provider you are looking for under [facility] and page to a number that you can be directly reached. 4. If you still have difficulty reaching the provider, please  page the [facility] (Director on Call) for the Hospitalists listed on amion for assistance.

## 2021-03-09 NOTE — H&P (View-Only) (Signed)
Referring Provider: Dr. Sarajane Jews Primary Care Physician:  Reita Cliche, MD Primary Gastroenterologist:  Althia Forts [facility])  Reason for Consultation:  Obstructive jaundice  HPI: Laura Sherman is a 70 y.o. 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  Patient reports fatigue and weakness over the last 2 weeks.  She states she was unable to stay awake.  She noticed dark urine about a week ago.  She did not realize she was jaundiced, though her daughter noted jaundice earlier this week.  She reports decreased appetite over the last couple of weeks and states she has lost 15 pounds in the last month or so.  She denies any fever/chills, abdominal pain, nausea, vomiting.  Denies changes in bowel habits, melena, or hematochezia.  Family history pertinent for paternal uncle with pancreatic cancer.  Otherwise, no known family members with gastrointestinal malignancy.  She occasionally takes 81 mg aspirin but denies other NSAID or blood thinner use.  Past Medical History:  Diagnosis Date  . Arthritis   . Asthma   . Breast cancer ([place]) 2005   chemo, XRT  . Diabetes mellitus without complication ([place])   . Environmental allergies   . GERD (gastroesophageal reflux disease)   . Hypertension   . Uterine cancer ([place]) 2010   total hysterectomy, no chemo or XRT    Past Surgical History:  Procedure Laterality Date  . ABDOMINAL HYSTERECTOMY    . BACK SURGERY    . BREAST SURGERY  2005   chemo, XRT  . TONSILLECTOMY      Prior to Admission medications   Medication Sig Start Date End Date Taking? Authorizing Provider  acetaminophen (TYLENOL) 650 MG CR tablet Take 650 mg by mouth every 8 (eight) hours as needed for pain.   Yes [provider]  albuterol (VENTOLIN HFA) 108 (90 Base) MCG/ACT  inhaler Inhale 2 puffs into the lungs every 6 (six) hours as needed for wheezing or shortness of breath.   Yes [provider]  amLODipine (NORVASC) 10 MG tablet Take 10 mg by mouth daily.   Yes [provider]  atorvastatin (LIPITOR) 20 MG tablet Take 20 mg by mouth daily.   Yes [provider]  B Complex Vitamins (VITAMIN B COMPLEX PO) Take 1 tablet by mouth daily.   Yes [provider]  calcium carbonate (OSCAL) 1500 (600 Ca) MG TABS tablet Take 600 mg of elemental calcium by mouth daily.   Yes [provider]  diclofenac (VOLTAREN) 75 MG EC tablet Take 75 mg by mouth 2 (two) times daily.   Yes [provider]  ergocalciferol (VITAMIN D2) 1.25 MG (50000 UT) capsule Take 50,000 Units by mouth every 14 (fourteen) days. 08/12/16  Yes [provider]  fexofenadine (ALLEGRA) 60 MG tablet Take 60 mg by mouth daily.   Yes [provider]  fluticasone (FLONASE) 50 MCG/ACT nasal spray Place 1 spray into both nostrils daily.   Yes [provider]  fluticasone (FLOVENT DISKUS) 50 MCG/BLIST diskus inhaler Inhale 1 puff into the lungs 2 (two) times daily.   Yes [provider]  glipiZIDE (GLUCOTROL) 5 MG tablet Take 2.5 mg by mouth daily. 02/20/21  Yes [provider]  hydrochlorothiazide (HYDRODIURIL) 25 MG tablet Take 25 mg by mouth daily.   Yes [provider]  loratadine (CLARITIN) 10 MG tablet Take 10 mg  by mouth daily.   Yes [provider]  losartan-hydrochlorothiazide (HYZAAR) 100-25 MG tablet Take 1 tablet by mouth daily.   Yes [provider]  metFORMIN (GLUCOPHAGE) 500 MG tablet Take by mouth 2 (two) times daily with a meal.   Yes [provider]  metoprolol (LOPRESSOR) 100 MG tablet Take 100 mg by mouth 2 (two) times daily.   Yes [provider]  montelukast (SINGULAIR) 10 MG tablet Take 10 mg by mouth daily. 02/24/21  Yes [provider]  Multiple  Vitamin (MULTIVITAMIN WITH MINERALS) TABS tablet Take 1 tablet by mouth daily.   Yes [provider]  olmesartan (BENICAR) 40 MG tablet Take 40 mg by mouth daily. 01/08/21  Yes [provider]  omeprazole (PRILOSEC) 10 MG capsule Take 10 mg by mouth daily.   Yes [provider]  oxymetazoline (AFRIN) 0.05 % nasal spray Place 1 spray into both nostrils daily as needed for congestion.   Yes [provider]  pioglitazone (ACTOS) 30 MG tablet Take 30 mg by mouth daily.   Yes [provider]  Pseudoephedrine-Guaifenesin 8025450006 MG TB12 Take 1 tablet by mouth 2 (two) times daily.   Yes [provider]  Vitamin E 100 units TABS Take 100 Units by mouth daily.   Yes [provider]    Scheduled Meds: . amLODipine  10 mg Oral Daily  . atorvastatin  20 mg Oral Daily  . budesonide  0.25 mg Nebulization BID  . insulin aspart  0-5 Units Subcutaneous QHS  . insulin aspart  0-9 Units Subcutaneous TID WC  . metoprolol tartrate  100 mg Oral BID  . pantoprazole  40 mg Oral Daily  . sodium chloride flush  3 mL Intravenous Q12H   Continuous Infusions: . sodium chloride 150 mL/hr at 03/09/21 0140   PRN Meds:.ondansetron **OR** ondansetron (ZOFRAN) IV  Allergies as of 02/21/2021 - Review Complete 02/14/2021  Allergen Reaction Noted  . Erythromycin Nausea Only 03/23/2014  . Penicillins Rash 12/24/2015  . Sulfa antibiotics Nausea Only 05/05/2014  . Tape Rash 03/07/2021    Family History  Problem Relation Age of Onset  . COPD Mother     Social History   Socioeconomic History  . Marital status: Married    Spouse name: Not on file  . Number of children: Not on file  . Years of education: Not on file  . Highest education level: Not on file  Occupational History  . Not on file  Tobacco Use  . Smoking status: Never Smoker  . Smokeless tobacco: Never Used  Vaping Use  . Vaping Use: Never used  Substance and Sexual Activity  . Alcohol  use: No  . Drug use: No  . Sexual activity: Not on file  Other Topics Concern  . Not on file  Social History Narrative  . Not on file   Social Determinants of Health   Financial Resource Strain: Not on file  Food Insecurity: Not on file  Transportation Needs: Not on file  Physical Activity: Not on file  Stress: Not on file  Social Connections: Not on file  Intimate Partner Violence: Not on file    Review of Systems: Review of Systems  Constitutional: Positive for malaise/fatigue and weight loss. Negative for chills and fever.  HENT: Negative for hearing loss and tinnitus.   Eyes: Negative for pain and redness.  Respiratory: Positive for cough. Negative for shortness of breath.   Cardiovascular: Negative for chest pain and palpitations.  Gastrointestinal: Negative  for abdominal pain, blood in stool, constipation, diarrhea, heartburn, melena, nausea and vomiting.  Genitourinary: Negative for flank pain and hematuria.  Musculoskeletal: Negative for falls and joint pain.  Skin: Negative for itching and rash.  Neurological: Negative for seizures and loss of consciousness.  Endo/Heme/Allergies: Negative for polydipsia. Does not bruise/bleed easily.  Psychiatric/Behavioral: Negative for substance abuse. The patient is not nervous/anxious.     Physical Exam: Vital signs: Vitals:   03/09/21 0624 03/09/21 0700  BP:    Pulse: 86   Resp: (!) 23   Temp:  98.1 F (36.7 C)  SpO2: 92%    Last BM Date: 03/05/2021    Physical Exam Vitals reviewed.  Constitutional:      General: She is not in acute distress. HENT:     Head: Normocephalic and atraumatic.     Nose: Nose normal. No congestion.     Mouth/Throat:     Mouth: Mucous membranes are moist.     Pharynx: Oropharynx is clear.  Eyes:     General: Scleral icterus present.     Extraocular Movements: Extraocular movements intact.  Cardiovascular:     Rate and Rhythm: Normal rate and regular rhythm.  Pulmonary:     Effort:  Pulmonary effort is normal. No respiratory distress.  Abdominal:     General: Bowel sounds are normal. There is no distension.     Palpations: Abdomen is soft.     Tenderness: There is no abdominal tenderness. There is no guarding or rebound.     Hernia: No hernia is present.  Musculoskeletal:        General: No swelling or tenderness.  Skin:    General: Skin is warm and dry.     Coloration: Skin is jaundiced.  Neurological:     General: No focal deficit present.     Mental Status: She is oriented to person, place, and time. She is lethargic.  Psychiatric:        Mood and Affect: Mood normal.        Behavior: Behavior normal. Behavior is cooperative.      GI:  Lab Results: Recent Labs    03/05/2021 1113 03/09/21 0317  WBC 10.4 9.4  HGB 10.4* 9.8*  HCT 31.3* 29.4*  PLT 282 249   BMET Recent Labs    03/04/2021 1113 02/09/2021 1431 02/11/2021 1801 03/02/2021 2259 03/09/21 0317 03/09/21 0702  NA 126* 126*  --   --  129*  --   K 6.5* 6.1*   < > 5.6* 5.0  5.1 5.1  CL 97* 98  --   --  101  --   CO2 17* 19*  --   --  19*  --   GLUCOSE 323* 427*  --   --  238*  --   BUN 62* 61*  --   --  58*  --   CREATININE 2.38* 2.26*  --   --  1.66*  --   CALCIUM 9.4 11.2*  --   --  8.8*  --    < > = values in this interval not displayed.   LFT Recent Labs    03/09/21 0317  PROT 6.7  ALBUMIN 2.4*  AST 348*  ALT 338*  ALKPHOS 1,522*  BILITOT 15.2*   PT/INR Recent Labs    02/20/2021 1113 03/09/21 0317  LABPROT 27.0* 29.8*  INR 2.5* 2.8*     Studies/Results: DG Chest Port 1 View  Result Date: 03/01/2021 CLINICAL DATA:  Cough.  Fatigue. EXAM:  PORTABLE CHEST 1 VIEW COMPARISON:  Chest x-ray 12/24/2015. FINDINGS: Mediastinum and hilar structures normal. Cardiomegaly. No pulmonary venous congestion. Pulmonary nodular opacities are noted bilaterally. An infectious process or metastatic disease cannot be excluded. Contrast-enhanced CT of the chest suggested for further evaluation. Mild  bibasilar atelectasis. No pleural effusion or pneumothorax. Degenerative change thoracic spine. Surgical clips right chest. IMPRESSION: Nodular opacities are noted over both lungs. An infectious process or metastatic disease cannot be excluded. Contrast-enhanced CT of the chest suggested for further evaluation. Electronically Signed   By: Marcello Moores  Register   On: 02/13/2021 11:50   DG Knee Right Port  Result Date: 02/15/2021 CLINICAL DATA:  Fall with knee pain EXAM: PORTABLE RIGHT KNEE - 1-2 VIEW COMPARISON:  12/24/2015 FINDINGS: Small knee joint effusion. Advanced degenerative change of the patellofemoral joint. Degenerative change also of the weight-bearing compartments with marginal osteophytes, worse lateral than medial. Multiple intra-articular loose bodies. No acute traumatic finding. IMPRESSION: No acute traumatic finding. 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 Small joint effusion. Electronically Signed   By: Nelson Chimes M.D.   On: 02/22/2021 11:58   CT CHEST ABDOMEN PELVIS WO CONTRAST  Result Date: 02/12/2021 CLINICAL DATA:  Weakness.  Recent fall. EXAM: CT CHEST, ABDOMEN AND PELVIS WITHOUT CONTRAST TECHNIQUE: Multidetector CT imaging of the chest, abdomen and pelvis was performed following the standard protocol without IV contrast. COMPARISON:  None. FINDINGS: CT CHEST FINDINGS Cardiovascular: Atherosclerosis of thoracic aorta is noted without aneurysm formation. Normal cardiac size. No pericardial effusion. Coronary artery calcifications are noted. Mediastinum/Nodes: Thyroid gland is unremarkable. Esophagus is unremarkable. 2 cm subcarinal lymph node is noted. 1.5 cm right paratracheal lymph node is noted. Lungs/Pleura: No pneumothorax or pleural effusion is noted. Multiple nodules are noted throughout both lungs concerning for metastatic disease. The largest measures 1.9 cm in left lower lobe. Musculoskeletal: No chest wall mass or suspicious bone lesions  identified. CT ABDOMEN PELVIS FINDINGS Hepatobiliary: Cholelithiasis is noted. Multiple ill-defined hypoechoic areas are noted throughout the liver concerning for metastatic disease. The largest measures 5.2 x 4.3 cm in the anterior portion of the right hepatic lobe. Mild intrahepatic biliary dilatation is noted concerning for obstruction of the distal common bile duct which may be due to possible pancreatic head mass measuring 2.7 cm. Pancreas: As noted above, possible low density is noted in the pancreatic head which is ill-defined and measures approximately 2.7 cm, concerning for possible pancreatic malignancy. Spleen: Normal in size without focal abnormality. Adrenals/Urinary Tract: Adrenal glands are unremarkable. Kidneys are normal, without renal calculi, focal lesion, or hydronephrosis. Bladder is unremarkable. Stomach/Bowel: Stomach is within normal limits. Appendix appears normal. No evidence of bowel wall thickening, distention, or inflammatory changes. Vascular/Lymphatic: Atherosclerosis of abdominal aorta is noted without aneurysm formation. Periaortic adenopathy is noted concerning for metastatic disease. The largest lymph node measures 9 mm in minor axis. Reproductive: Status post hysterectomy. No adnexal masses. Other: Minimal free fluid is noted in the pelvis. No definite hernia is noted. Musculoskeletal: No acute or significant osseous findings. IMPRESSION: Multiple pulmonary nodules are noted consistent with metastatic disease. Multiple ill-defined hypoechoic areas are noted in the hepatic parenchyma concerning for metastatic disease. The largest such lesion measures 5.2 cm in the right hepatic lobe. Possible well-defined low density seen in pancreatic head which measures 2.7 cm and is concerning for possible pancreatic malignancy. Further evaluation with MRI or CT scan with intravenous contrast is recommended. Mild intrahepatic biliary dilatation is noted which may be due to obstruction secondary  to this  mass. Periaortic adenopathy is noted concerning for metastatic disease. Coronary artery calcifications are noted suggesting coronary artery disease. Cholelithiasis. Aortic Atherosclerosis (ICD10-I70.0). Electronically Signed   By: Marijo Conception M.D.   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  CT was without contrast due to kidney function, radiology recommended MRI vs. CT with contrast for further evaluation. -T. Bili 15.2/ AST 348/ ALT 338/ ALP 1522  Coagulopathy, likely related to liver metastases: INR 2.8  AKI, improving: Cr 1.66 today  History of breast cancer and uterine adenocarcinoma  Plan: Plan for ERCP with possible stent placement once INR <2.0.  Reversal of INR not ideal, given malignancy, which can cause increased risk of blood clots.  Clear liquid diet.  MRI/MRCP today for further evaluation.  After MRI/MRCP, OK to transition to soft diet.  Continue to trend LFTs.  Continue to trend INR.  [facility] will follow.   LOS: 1 day   Salley Slaughter  PA-C 03/09/2021, 8:44 AM  Contact #  [phone]

## 2021-03-09 NOTE — ED Notes (Signed)
Wasted 75 mcg in stericycle with Elba Barman, Therapist, sports. Not available in pyxis.

## 2021-03-10 DIAGNOSIS — N179 Acute kidney failure, unspecified: Secondary | ICD-10-CM | POA: Diagnosis not present

## 2021-03-10 DIAGNOSIS — K831 Obstruction of bile duct: Secondary | ICD-10-CM | POA: Diagnosis not present

## 2021-03-10 DIAGNOSIS — C801 Malignant (primary) neoplasm, unspecified: Secondary | ICD-10-CM | POA: Diagnosis not present

## 2021-03-10 DIAGNOSIS — D689 Coagulation defect, unspecified: Secondary | ICD-10-CM | POA: Diagnosis not present

## 2021-03-10 LAB — COMPREHENSIVE METABOLIC PANEL
ALT: 336 U/L — ABNORMAL HIGH (ref 0–44)
AST: 410 U/L — ABNORMAL HIGH (ref 15–41)
Albumin: 2.2 g/dL — ABNORMAL LOW (ref 3.5–5.0)
Alkaline Phosphatase: 1622 U/L — ABNORMAL HIGH (ref 38–126)
Anion gap: 9 (ref 5–15)
BUN: 48 mg/dL — ABNORMAL HIGH (ref 8–23)
CO2: 18 mmol/L — ABNORMAL LOW (ref 22–32)
Calcium: 8.7 mg/dL — ABNORMAL LOW (ref 8.9–10.3)
Chloride: 105 mmol/L (ref 98–111)
Creatinine, Ser: 1.24 mg/dL — ABNORMAL HIGH (ref 0.44–1.00)
GFR, Estimated: 47 mL/min — ABNORMAL LOW (ref >60)
Glucose, Bld: 192 mg/dL — ABNORMAL HIGH (ref 70–99)
Potassium: 4.9 mmol/L (ref 3.5–5.1)
Sodium: 132 mmol/L — ABNORMAL LOW (ref 135–145)
Total Bilirubin: 13.6 mg/dL — ABNORMAL HIGH (ref 0.3–1.2)
Total Protein: 6.2 g/dL — ABNORMAL LOW (ref 6.5–8.1)

## 2021-03-10 LAB — PROTIME-INR
INR: 3.6 — ABNORMAL HIGH (ref 0.8–1.2)
Prothrombin Time: 35.6 seconds — ABNORMAL HIGH (ref 11.4–15.2)

## 2021-03-10 LAB — GLUCOSE, CAPILLARY
Glucose-Capillary: 199 mg/dL — ABNORMAL HIGH (ref 70–99)
Glucose-Capillary: 239 mg/dL — ABNORMAL HIGH (ref 70–99)
Glucose-Capillary: 226 mg/dL — ABNORMAL HIGH (ref 70–99)
Glucose-Capillary: 224 mg/dL — ABNORMAL HIGH (ref 70–99)

## 2021-03-10 LAB — MAGNESIUM: Magnesium: 2.3 mg/dL (ref 1.7–2.4)

## 2021-03-10 LAB — D-DIMER, QUANTITATIVE: D-Dimer, Quant: 5.68 ug/mL-FEU — ABNORMAL HIGH (ref 0.00–0.50)

## 2021-03-10 LAB — PHOSPHORUS: Phosphorus: 2.8 mg/dL (ref 2.5–4.6)

## 2021-03-10 LAB — TSH: TSH: 2.728 u[IU]/mL (ref 0.350–4.500)

## 2021-03-10 MED ORDER — INSULIN GLARGINE 100 UNIT/ML ~~LOC~~ SOLN
8.0000 [IU] | Freq: Every day | SUBCUTANEOUS | Status: DC
  Administered 2021-03-11: 8 [IU] via SUBCUTANEOUS
  Filled 2021-03-10 (×2): qty 0.08

## 2021-03-10 MED ORDER — GUAIFENESIN-DM 100-10 MG/5ML PO SYRP
5.0000 mL | ORAL_SOLUTION | ORAL | Status: DC | PRN
  Administered 2021-03-10: 5 mL via ORAL
  Filled 2021-03-10: qty 10

## 2021-03-10 MED ORDER — HYDROCOD POLST-CPM POLST ER 10-8 MG/5ML PO SUER
5.0000 mL | Freq: Two times a day (BID) | ORAL | Status: DC | PRN
  Administered 2021-03-10: 5 mL via ORAL
  Filled 2021-03-10: qty 5

## 2021-03-10 NOTE — Progress Notes (Signed)
[facility] Progress Note  Laura Sherman 70 y.o. 24-Jul-1951   Subjective: Abdominal pain; no appetite; no N/V.  Objective: Vital signs: Vitals:   03/10/21 0828 03/10/21 1016  BP:  (!) 148/70  Pulse:  99  Resp:    Temp:    SpO2: 95%   T 98  Physical Exam: Gen: lethargic, elderly, well-nourished, no acute distress, jaundice  HEENT: +icteric sclera CV: RRR Chest: CTA B Abd: epigastric and RUQ tenderness with guarding, +distention, +BS Ext: no edema  Lab Results: Recent Labs    03/09/21 0317 03/09/21 0702 03/09/21 0926 03/10/21 0424  NA 129*  --   --  132*  K 5.0  5.1 5.1  --  4.9  CL 101  --   --  105  CO2 19*  --   --  18*  GLUCOSE 238*  --   --  192*  BUN 58*  --   --  48*  CREATININE 1.66*  --   --  1.24*  CALCIUM 8.8*  --   --  8.7*  MG  --   --  1.8 2.3  PHOS  --   --  2.7 2.8   Recent Labs    03/09/21 0317 03/10/21 0424  AST 348* 410*  ALT 338* 336*  ALKPHOS 1,522* 1,622*  BILITOT 15.2* 13.6*  PROT 6.7 6.2*  ALBUMIN 2.4* 2.2*   Recent Labs    03/21/2021 1113 03/09/21 0317  WBC 10.4 9.4  NEUTROABS 8.8*  --   HGB 10.4* 9.8*  HCT 31.3* 29.4*  MCV 86.7 87.8  PLT 282 249      Assessment/Plan: Obstructive jaundice from pancreatic cancer with lung mets in need of an ERCP with metal stent placement when INR is less than 2. INR continues to rise and likely needs correction despite tumor burden. Dr. Sarajane Sherman will discuss with family. Consider ERCP on 5/2 or 5/3 pending INR level. Supportive care. Will follow.   Laura Sherman 03/10/2021, 11:15 AM  Questions please call [phone] ID: Laura Sherman, female   DOB: 1951-03-31, 70 y.o.   MRN: [id number]

## 2021-03-10 NOTE — Progress Notes (Signed)
PROGRESS NOTE  Laura Sherman SVX:[id number] DOB: Jun 09, 1951 DOA: 03/04/2021 PCP: Reita Cliche, MD  Brief History   70 year old woman PMH breast cancer, cervical or uterine cancer, diabetes mellitus type 2, hypertension presented to the emergency department with 1 week history of failure to thrive, anorexia, fatigue.  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  Seen by gastroenterology with plans for ERCP and EUS once coagulopathy corrected.  A & P  Jaundice, elevated LFTs, abnormal CT concerning for pancreatic malignancy with associated obstructive jaundice, confirmed by MRI. -- Labs stable.  MRI showed multiple hepatic lesions, pancreatic lesion, obstruction explaining jaundice. --ERCP/EUS when coagulopathy corrected as per GI.  Continue supportive care.   Pancreatic mass, multiple lung nodules, multiple hepatic lesions consistent with metastatic disease.  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 -- Plan ERCP EUS when coagulopathy corrected. --PMH of endometrial carcinoma and breast cancer noted --Patient wants to follow-up with her oncologist in [facility]   Acute kidney injury secondary to poor oral intake, prerenal azotemia.  No cardiac history. -- Renal function continues to improve rapidly with fluids.  Advance diet today until ERCP can be done.  SVT -- No significant recurrence continue metoprolol.  Potassium, magnesium, phosphorus within normal limits.  Monitor clinically.  Diabetes mellitus type 2 with hyperglycemia hemoglobin A1c 8.0 -- Hold oral medications.  Hyperglycemic.  Increase low-dose Lantus.   Hyponatremia secondary to poor oral intake, corrected 131 -- [place]est in nature.  Improving with IV fluids.   Coagulopathy, most likely secondary to metastatic liver disease -- Remains coagulopathic -- Need to attempt to correct in order to obtain tissue biopsy.  Would like to treat with vitamin K,  however patient does have risk for VTE given malignancy.  Discussed with family this morning they are considering.  Will discuss again.   Aortic atherosclerosis -- No treatment indicated  RESOLVED Hyperkalemia secondary to acute kidney injury -- Resolved with treatment and IV fluids.  Hypercalcemia, concerning given suspected malignancy -- Resolved with hydration.  Trivial troponin elevation -- Troponins flat, no further evaluation suggested.  Unclear why checked, no signs or symptoms to suggest ACS.  EKG nonacute.   PMH . Endometrial carcinoma status post robotic assisted hysterectomy and bilateral salpingo-oophorectomy in 2014. Pathology well-differentiated endometrial carcinoma with negative nodes . Breast cancer, 2006 ductal adenocarcinoma status right lumpectomy, radiation and chemo post mastectomy ER positive HER-2/neu negative completed antiestrogen therapy   Disposition Plan:  Discussion:   Status is: Inpatient  Remains inpatient appropriate because:Ongoing diagnostic testing needed not appropriate for outpatient work up, IV treatments appropriate due to intensity of illness or inability to take PO and Inpatient level of care appropriate due to severity of illness   Dispo: The patient is from: Home              Anticipated d/c is to: Home              Patient currently is not medically stable to d/c.   Difficult to place patient No  DVT prophylaxis: SCDs Start: 02/27/2021 1912   Code Status: DNR Level of care: Telemetry Family Communication: husband and daughter at bedside  Murray Hodgkins, MD  [facility] Direct contact: see [email or web address] (further directions at bottom of note if needed) 7PM-7AM contact night coverage as at bottom of note 03/10/2021, 2:14 PM  LOS: 2 days   Significant Hospital Events   . 4/28 admit for obstructive jaundice   Consults:  . GI   Procedures:  .  Significant Diagnostic Tests:  . CT chest, abd, pelvis w/ pancreatic mass,  hepatic and lung lesions concerning for metastatic disease . MRI w/ multiple liver lesions c/w metastatic disease, associated with biliary and pancreatic duct obstruction, lesion in the head of the pancreas, suspected pancreatic adenocarcinoma, multiple pulmonary nodules   Micro Data:  . None    Antimicrobials:  . None   Interval History/Subjective  CC: f/u jaundice  Feels better today, no n/v, tolerating liquids. C/o cough 2 months, worse last week. Breathing fine.  Objective   Vitals:  Vitals:   03/10/21 0828 03/10/21 1016  BP:  (!) 148/70  Pulse:  99  Resp:    Temp:    SpO2: 95%     Exam:  Constitutional:   . Appears calm and comfortable ENMT:  . grossly normal hearing  Respiratory:  . CTA bilaterally, no w/r/r.  . Respiratory effort normal. Cardiovascular:  . RRR, no m/r/g . No LE extremity edema   Psychiatric:  . Mental status o Mood, affect appropriate  I have personally reviewed the following:   Today's Data  . Na+ up to 132 . Creatinine down to 1.24 . Mg, Phos WNL . LFTs w/o sig change . INR 3.6  Scheduled Meds: . amLODipine  10 mg Oral Daily  . atorvastatin  20 mg Oral Daily  . budesonide  0.25 mg Nebulization BID  . guaiFENesin  600 mg Oral BID  . insulin aspart  0-5 Units Subcutaneous QHS  . insulin aspart  0-9 Units Subcutaneous TID WC  . [START ON 03/11/2021] insulin glargine  8 Units Subcutaneous Daily  . metoprolol tartrate  100 mg Oral BID  . pantoprazole  40 mg Oral Daily  . sodium chloride flush  3 mL Intravenous Q12H   Continuous Infusions: . sodium chloride 150 mL/hr at 03/10/21 1029    Principal Problem:   Obstructive jaundice due to malignant neoplasm (HCC) Active Problems:   AKI (acute kidney injury) ([place])   Hyperkalemia   DM type 2 (diabetes mellitus, type 2) ([place])   Coagulopathy ([place])   Aortic atherosclerosis ([place])   Hypercalcemia   LOS: 2 days   How to contact the [facility] Attending or Consulting provider [place] or  covering provider during after hours [place], for this patient?  1. Check the care team in [facility] and look for a) attending/consulting TR[facility] provider listed and b) the [facility] team listed 2. Log into [email or web address] and use [place]'s universal password to access. If you do not have the password, please contact the hospital operator. 3. Locate the [facility] provider you are looking for under [facility] and page to a number that you can be directly reached. 4. If you still have difficulty reaching the provider, please page the [facility] (Director on Call) for the Hospitalists listed on amion for assistance.

## 2021-03-10 NOTE — Progress Notes (Signed)
   03/10/21 2117  Provider Notification  Provider Name/Title Hal Hope, MD  Date Provider Notified 03/10/21  Time Provider Notified 2117  Notification Type Page  Notification Reason Other (Comment) (Tele report 19 beat run SVT, up to 150 BPM)  Provider response At bedside;See new orders  Date of Provider Response 03/10/21

## 2021-03-11 ENCOUNTER — Inpatient Hospital Stay (HOSPITAL_COMMUNITY): Payer: Medicare Other

## 2021-03-11 DIAGNOSIS — K831 Obstruction of bile duct: Secondary | ICD-10-CM | POA: Diagnosis not present

## 2021-03-11 DIAGNOSIS — I471 Supraventricular tachycardia: Secondary | ICD-10-CM | POA: Diagnosis not present

## 2021-03-11 DIAGNOSIS — E1165 Type 2 diabetes mellitus with hyperglycemia: Secondary | ICD-10-CM | POA: Diagnosis not present

## 2021-03-11 DIAGNOSIS — N179 Acute kidney failure, unspecified: Secondary | ICD-10-CM | POA: Diagnosis not present

## 2021-03-11 DIAGNOSIS — D689 Coagulation defect, unspecified: Secondary | ICD-10-CM | POA: Diagnosis not present

## 2021-03-11 LAB — COMPREHENSIVE METABOLIC PANEL
ALT: 338 U/L — ABNORMAL HIGH (ref 0–44)
AST: 436 U/L — ABNORMAL HIGH (ref 15–41)
Albumin: 2.1 g/dL — ABNORMAL LOW (ref 3.5–5.0)
Alkaline Phosphatase: 1710 U/L — ABNORMAL HIGH (ref 38–126)
Anion gap: 8 (ref 5–15)
BUN: 41 mg/dL — ABNORMAL HIGH (ref 8–23)
CO2: 17 mmol/L — ABNORMAL LOW (ref 22–32)
Calcium: 8.8 mg/dL — ABNORMAL LOW (ref 8.9–10.3)
Chloride: 112 mmol/L — ABNORMAL HIGH (ref 98–111)
Creatinine, Ser: 1.09 mg/dL — ABNORMAL HIGH (ref 0.44–1.00)
GFR, Estimated: 54 mL/min — ABNORMAL LOW (ref >60)
Glucose, Bld: 178 mg/dL — ABNORMAL HIGH (ref 70–99)
Potassium: 5.2 mmol/L — ABNORMAL HIGH (ref 3.5–5.1)
Sodium: 137 mmol/L (ref 135–145)
Total Bilirubin: 13.3 mg/dL — ABNORMAL HIGH (ref 0.3–1.2)
Total Protein: 6 g/dL — ABNORMAL LOW (ref 6.5–8.1)

## 2021-03-11 LAB — ECHOCARDIOGRAM COMPLETE
Area-P 1/2: 2.87 cm2
Height: 63 in
S' Lateral: 2.6 cm
Weight: 3456 oz

## 2021-03-11 LAB — TROPONIN I (HIGH SENSITIVITY)
Troponin I (High Sensitivity): 23 ng/L — ABNORMAL HIGH (ref <18)
Troponin I (High Sensitivity): 27 ng/L — ABNORMAL HIGH (ref <18)

## 2021-03-11 LAB — MAGNESIUM: Magnesium: 2.1 mg/dL (ref 1.7–2.4)

## 2021-03-11 LAB — GLUCOSE, CAPILLARY
Glucose-Capillary: 175 mg/dL — ABNORMAL HIGH (ref 70–99)
Glucose-Capillary: 281 mg/dL — ABNORMAL HIGH (ref 70–99)
Glucose-Capillary: 250 mg/dL — ABNORMAL HIGH (ref 70–99)
Glucose-Capillary: 254 mg/dL — ABNORMAL HIGH (ref 70–99)

## 2021-03-11 LAB — PROTIME-INR
INR: 3.2 — ABNORMAL HIGH (ref 0.8–1.2)
Prothrombin Time: 32.6 seconds — ABNORMAL HIGH (ref 11.4–15.2)

## 2021-03-11 MED ORDER — FLUTICASONE PROPIONATE 50 MCG/ACT NA SUSP
1.0000 | Freq: Every day | NASAL | Status: DC
  Administered 2021-03-11: 1 via NASAL
  Filled 2021-03-11: qty 16

## 2021-03-11 MED ORDER — INSULIN ASPART 100 UNIT/ML IJ SOLN
0.0000 [IU] | Freq: Every day | INTRAMUSCULAR | Status: DC
  Administered 2021-03-11 – 2021-03-12 (×2): 3 [IU] via SUBCUTANEOUS
  Administered 2021-03-14 – 2021-03-19 (×4): 2 [IU] via SUBCUTANEOUS

## 2021-03-11 MED ORDER — INSULIN ASPART 100 UNIT/ML IJ SOLN
0.0000 [IU] | Freq: Three times a day (TID) | INTRAMUSCULAR | Status: DC
  Administered 2021-03-11 – 2021-03-12 (×4): 5 [IU] via SUBCUTANEOUS
  Administered 2021-03-13: 8 [IU] via SUBCUTANEOUS
  Administered 2021-03-13 – 2021-03-15 (×5): 5 [IU] via SUBCUTANEOUS
  Administered 2021-03-16: 3 [IU] via SUBCUTANEOUS
  Administered 2021-03-16 – 2021-03-17 (×5): 5 [IU] via SUBCUTANEOUS
  Administered 2021-03-18 (×3): 3 [IU] via SUBCUTANEOUS
  Administered 2021-03-19: 2 [IU] via SUBCUTANEOUS
  Administered 2021-03-19 (×2): 5 [IU] via SUBCUTANEOUS
  Administered 2021-03-20 (×2): 2 [IU] via SUBCUTANEOUS
  Administered 2021-03-21: 3 [IU] via SUBCUTANEOUS
  Administered 2021-03-21: 5 [IU] via SUBCUTANEOUS

## 2021-03-11 MED ORDER — PERFLUTREN LIPID MICROSPHERE
1.0000 mL | INTRAVENOUS | Status: AC | PRN
  Administered 2021-03-11: 2 mL via INTRAVENOUS
  Filled 2021-03-11: qty 10

## 2021-03-11 MED ORDER — PHYTONADIONE 5 MG PO TABS
5.0000 mg | ORAL_TABLET | Freq: Once | ORAL | Status: AC
  Administered 2021-03-11: 5 mg via ORAL
  Filled 2021-03-11: qty 1

## 2021-03-11 NOTE — Progress Notes (Addendum)
PROGRESS NOTE  Laura Sherman JTT:[id number] DOB: August 30, 1951 DOA: 03/02/2021 PCP: Reita Cliche, MD  Brief History   70 year old woman PMH breast cancer, cervical or uterine cancer, diabetes mellitus type 2, hypertension presented to the emergency department with 1 week history of failure to thrive, anorexia, fatigue.  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  Seen by gastroenterology with plans for ERCP and EUS once coagulopathy corrected.  A & P  Obstructive jaundice, elevated LFTs, abnormal CT and MRI concerning for pancreatic malignancy. --Labs remained stable, clinically stable.  ERCP/EUS when coagulopathy corrected as per GI.    Pancreatic mass, multiple lung nodules, multiple hepatic lesions consistent with metastatic disease.  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 -- Plan ERCP EUS when coagulopathy corrected. --PMH of endometrial carcinoma and breast cancer noted --Patient wants to follow-up with her oncologist in [place]   Coagulopathy, most likely secondary to metastatic liver disease -- Remains coagulopathic preventing biopsy. --Discussed in detail risk/benefit of vitamin K in attempt to lower INR.  Patient, husband, daughter understand this increases risk of VTE however they accept the risk.   Acute kidney injury secondary to poor oral intake, prerenal azotemia.  No cardiac history. -- Acute kidney injury nearly resolved.  Expect further improvement with oral hydration.  Stop IV fluids.  SVT -- Several instances of recurrence, self-limited, asymptomatic. --TSH within normal limits.  2D echocardiogram unremarkable.  May be secondary to acute illness.  Continue to monitor.  May need outpatient follow-up.  Continue beta-blocker. --Magnesium within normal limits.  Diabetes mellitus type 2 with hyperglycemia hemoglobin A1c 8.0 -- Continue to hold oral medications.  Remains hyperglycemic, will  increase Lantus.  Will increase sliding scale insulin.   Hyponatremia secondary to poor oral intake, corrected 131 -- resolved.    Aortic atherosclerosis -- No treatment indicated  RESOLVED Hyperkalemia secondary to acute kidney injury -- Resolved with treatment and IV fluids.  Hypercalcemia, concerning given suspected malignancy -- Resolved with hydration.  Trivial troponin elevation -- Troponins flat, no further evaluation suggested.  Unclear why checked, no signs or symptoms to suggest ACS.  EKG nonacute.   PMH . Endometrial carcinoma status post robotic assisted hysterectomy and bilateral salpingo-oophorectomy in 2014. Pathology well-differentiated endometrial carcinoma with negative nodes . Breast cancer, 2006 ductal adenocarcinoma status right lumpectomy, radiation and chemo post mastectomy ER positive HER-2/neu negative completed antiestrogen therapy   Disposition Plan:  Discussion:   Status is: Inpatient  Remains inpatient appropriate because:Ongoing diagnostic testing needed not appropriate for outpatient work up, IV treatments appropriate due to intensity of illness or inability to take PO and Inpatient level of care appropriate due to severity of illness   Dispo: The patient is from: Home              Anticipated d/c is to: Home              Patient currently is not medically stable to d/c.   Difficult to place patient No  DVT prophylaxis: SCDs Start: 02/09/2021 1912   Code Status: DNR Level of care: Telemetry Family Communication: husband and daughter at bedside  Murray Hodgkins, MD  [facility] Direct contact: see [email or web address] (further directions at bottom of note if needed) 7PM-7AM contact night coverage as at bottom of note 03/11/2021, 1:10 PM  LOS: 3 days   Significant Hospital Events   . 4/28 admit for obstructive jaundice   Consults:  . GI   Procedures:  .   Significant  Diagnostic Tests:  . CT chest, abd, pelvis w/ pancreatic mass, hepatic  and lung lesions concerning for metastatic disease . MRI w/ multiple liver lesions c/w metastatic disease, associated with biliary and pancreatic duct obstruction, lesion in the head of the pancreas, suspected pancreatic adenocarcinoma, multiple pulmonary nodules   Micro Data:  . None    Antimicrobials:  . None   Interval History/Subjective  CC: f/u jaundice  Feels ok today, cough medicine helping Reports bronchitis episodes in Spring and trouble w/ pollen No n/v, breathing fine  Objective   Vitals:  Vitals:   03/11/21 0801 03/11/21 1205  BP:  119/68  Pulse:  91  Resp:  (!) 21  Temp:  98.4 F (36.9 C)  SpO2: 95% 95%    Exam:  Constitutional:   . Appears calm and comfortable ENMT:  . grossly normal hearing  Respiratory:  . CTA bilaterally, no w/r/r.  . Respiratory effort normal.  Cardiovascular:  . RRR, no m/r/g . No LE extremity edema   Psychiatric:  . Mental status o Mood, affect appropriate  I have personally reviewed the following:   Today's Data  . UOP 1800 . CBG stable . K+ 5.2 . Creatinine down to 1.09 . BUN down to 41 . LFTs stable . troponins flat . ddimer high, unsurprising given malignancy, no s/s VTE . INR 3.2 . TSH WNL  Scheduled Meds: . amLODipine  10 mg Oral Daily  . atorvastatin  20 mg Oral Daily  . budesonide  0.25 mg Nebulization BID  . fluticasone  1 spray Each Nare Daily  . guaiFENesin  600 mg Oral BID  . insulin aspart  0-15 Units Subcutaneous TID WC  . insulin aspart  0-5 Units Subcutaneous QHS  . insulin glargine  8 Units Subcutaneous Daily  . metoprolol tartrate  100 mg Oral BID  . pantoprazole  40 mg Oral Daily  . sodium chloride flush  3 mL Intravenous Q12H   Continuous Infusions:   Principal Problem:   Obstructive jaundice due to malignant neoplasm (HCC) Active Problems:   AKI (acute kidney injury) ([place])   Hyperkalemia   DM type 2 (diabetes mellitus, type 2) (HCC)   Coagulopathy (HCC)   Aortic atherosclerosis  ([place])   Hypercalcemia   LOS: 3 days   How to contact the [facility] Attending or Consulting provider 7A - 7P or covering provider during after hours [place], for this patient?  1. Check the care team in [facility] and look for a) attending/consulting TRH provider listed and b) the [facility] team listed 2. Log into [email or web address] and use [place]'s universal password to access. If you do not have the password, please contact the hospital operator. 3. Locate the [facility] provider you are looking for under [facility] and page to a number that you can be directly reached. 4. If you still have difficulty reaching the provider, please page the [facility] (Director on Call) for the Hospitalists listed on amion for assistance.

## 2021-03-11 NOTE — Progress Notes (Addendum)
I agree with previous RN's assessment. 

## 2021-03-11 NOTE — Progress Notes (Signed)
   03/11/21 2138  Assess: MEWS Score  Temp 98.2 F (36.8 C)  BP 139/74  Pulse Rate 93  Resp (!) 30  SpO2 93 %  O2 Device Room Air  Assess: MEWS Score  MEWS Temp 0  MEWS Systolic 0  MEWS Pulse 0  MEWS RR 2  MEWS LOC 0  MEWS Score 2  MEWS Score Color Yellow  Assess: if the MEWS score is Yellow or Red  Were vital signs taken at a resting state? Yes  Focused Assessment No change from prior assessment  Early Detection of Sepsis Score *See Row Information* Medium  MEWS guidelines implemented *See Row Information* Yes  Treat  MEWS Interventions Administered scheduled meds/treatments  Pain Scale 0-10  Pain Score 5  Pain Type Chronic pain  Pain Location Leg  Pain Orientation Right;Left  Pain Descriptors / Indicators Aching  Pain Frequency Intermittent  Pain Onset Gradual  Patients Stated Pain Goal 0  Pain Intervention(s) Repositioned;Other (Comment) (Refused Medication)  Notify: Charge Nurse/RN  Name of Charge Nurse/RN Notified Pam, RN  Date Charge Nurse/RN Notified 03/11/21  Time Charge Nurse/RN Notified 2145

## 2021-03-11 NOTE — Progress Notes (Signed)
[facility] Gastroenterology Progress Note  Laura Sherman 70 y.o. 20-Dec-1950   Subjective: Feels ok. Denies abdominal pain. Better appetite today.   Objective: Vital signs: Vitals:   03/11/21 0801 03/11/21 1205  BP:  119/68  Pulse:  91  Resp:  (!) 21  Temp:  98.4 F (36.9 C)  SpO2: 95% 95%    Physical Exam: Gen: lethargic, elderly, no acute distress, jaundice HEENT: +icteric sclera CV: RRR Chest: CTA B Abd: soft, nontender, nondistended, +BS Ext: no edema  Lab Results: Recent Labs    03/09/21 0926 03/10/21 0424 03/11/21 0505  NA  --  132* 137  K  --  4.9 5.2*  CL  --  105 112*  CO2  --  18* 17*  GLUCOSE  --  192* 178*  BUN  --  48* 41*  CREATININE  --  1.24* 1.09*  CALCIUM  --  8.7* 8.8*  MG 1.8 2.3 2.1  PHOS 2.7 2.8  --    Recent Labs    03/10/21 0424 03/11/21 0505  AST 410* 436*  ALT 336* 338*  ALKPHOS 1,622* 1,710*  BILITOT 13.6* 13.3*  PROT 6.2* 6.0*  ALBUMIN 2.2* 2.1*   Recent Labs    03/09/21 0317  WBC 9.4  HGB 9.8*  HCT 29.4*  MCV 87.8  PLT 249      Assessment/Plan: Malignant obstructive jaundice from metastatic pancreatic cancer in need of an ERCP with metal stent placement when coagulopathy is corrected. Vit K given this morning. INR 3.2. ERCP in the next 2-3 days pending INR. Dr. Therisa Doyne or Alessandra Bevels will f/u tomorrow. Unable to reach daughter yesterday or today by phone (goes to message). Unable to reach husband by phone.   Laura Sherman 03/11/2021, 1:59 PM  Questions please call [phone] ID: Laura Sherman, female   DOB: August 29, 1951, 70 y.o.   MRN: [id number]

## 2021-03-11 NOTE — Plan of Care (Signed)
  Problem: Clinical Measurements: Goal: Respiratory complications will improve Outcome: Progressing   Problem: Activity: Goal: Risk for activity intolerance will decrease Outcome: Progressing   

## 2021-03-11 NOTE — Evaluation (Signed)
Physical Therapy Evaluation Patient Details Name: Laura Sherman MRN: [id number] DOB: 07-26-1951 Today's Date: 03/11/2021   History of Present Illness  Pt admitted from home 2* weakness and falls and dx with AKI, hyperkalemia, hypercalcemia, hyponatremia and with CT findings "concerning for pancreatic malignancy, multiple lung nodules and multiple ill-defined areas in liver concerning for metastatic disease".  Pt with hx of DM back surgery and breast CA  Clinical Impression  Pt admitted as above and presenting with functional mobility limitations 2* significant generalized weakness, limited endurance, and ambulatory balance deficits.  Pt currently requiring significant assist for safe performance of all basic mobility tasks but states her goal is to regain enough strength to return home with family and see her Oncologist in [place].     Follow Up Recommendations Home health PT;SNF (Dependent on acute stay progress and family ability to assist)    Equipment Recommendations  None recommended by PT    Recommendations for Other Services OT consult     Precautions / Restrictions Precautions Precautions: Fall Restrictions Weight Bearing Restrictions: No      Mobility  Bed Mobility Overal bed mobility: Needs Assistance Bed Mobility: Supine to Sit     Supine to sit: HOB elevated;Mod assist     General bed mobility comments: increased time, extensive use of bed rails and assist to pad to rotate and bring pt to EOB sitting with feet on floor    Transfers Overall transfer level: Needs assistance Equipment used: Rolling walker (2 wheeled) Transfers: Sit to/from Stand Sit to Stand: Mod assist;+2 physical assistance;From elevated surface;+2 safety/equipment         General transfer comment: Pt unable to rise on initial attempt and states "I need someone on the other side too".  Assist of two to bring wt up and fwd and to balance in initial standing with  RW  Ambulation/Gait Ambulation/Gait assistance: Min assist;+2 safety/equipment Gait Distance (Feet): 4 Feet Assistive device: Rolling walker (2 wheeled) Gait Pattern/deviations: Step-to pattern;Decreased step length - right;Decreased step length - left;Shuffle;Trunk flexed Gait velocity: decr   General Gait Details: short shuffling gait with flexed posture  Stairs            Wheelchair Mobility    Modified Rankin (Stroke Patients Only)       Balance Overall balance assessment: Needs assistance Sitting-balance support: No upper extremity supported;Feet supported Sitting balance-Leahy Scale: Good     Standing balance support: Bilateral upper extremity supported Standing balance-Leahy Scale: Poor                               Pertinent Vitals/Pain Pain Assessment: No/denies pain    Home Living Family/patient expects to be discharged to:: Private residence Living Arrangements: Spouse/significant other Available Help at Discharge: Family Type of Home: House Home Access: Stairs to enter Entrance Stairs-Rails: Right;Left;Can reach both Entrance Stairs-Number of Steps: 3 Home Layout: One level Home Equipment: Environmental consultant - 2 wheels;Walker - 4 wheels;Cane - single point      Prior Function Level of Independence: Independent with assistive device(s)         Comments: Pt using RW and largely household ambulator onlyq     Hand Dominance        Extremity/Trunk Assessment   Upper Extremity Assessment Upper Extremity Assessment: Generalized weakness    Lower Extremity Assessment Lower Extremity Assessment: Generalized weakness    Cervical / Trunk Assessment Cervical / Trunk Assessment: Kyphotic  Communication  Communication: No difficulties  Cognition Arousal/Alertness: Lethargic (but rousable) Behavior During Therapy: Flat affect Overall Cognitive Status: Within Functional Limits for tasks assessed                                         General Comments      Exercises General Exercises - Lower Extremity Ankle Circles/Pumps: AROM;Both;15 reps;Supine   Assessment/Plan    PT Assessment Patient needs continued PT services  PT Problem List Decreased strength;Decreased activity tolerance;Decreased balance;Decreased mobility;Decreased knowledge of use of DME;Obesity       PT Treatment Interventions DME instruction;Gait training;Stair training;Functional mobility training;Therapeutic activities;Therapeutic exercise;Balance training;Patient/family education    PT Goals (Current goals can be found in the Care Plan section)  Acute Rehab PT Goals Patient Stated Goal: Home with family and then to see her Oncologist in [facility] PT Goal Formulation: With patient Time For Goal Achievement: 03/25/21 Potential to Achieve Goals: Fair    Frequency Min 3X/week   Barriers to discharge Decreased caregiver support Pt states lives with spouse but he is "not in good shape"  but has dtr who assists    Co-evaluation               AM-PAC PT "6 Clicks" Mobility  Outcome Measure Help needed turning from your back to your side while in a flat bed without using bedrails?: A Little Help needed moving from lying on your back to sitting on the side of a flat bed without using bedrails?: A Lot Help needed moving to and from a bed to a chair (including a wheelchair)?: A Lot Help needed standing up from a chair using your arms (e.g., wheelchair or bedside chair)?: A Lot Help needed to walk in hospital room?: A Lot Help needed climbing 3-5 steps with a railing? : Total 6 Click Score: 12    End of Session Equipment Utilized During Treatment: Gait belt Activity Tolerance: Patient limited by fatigue Patient left: in chair;with call bell/phone within reach;with chair alarm set;with nursing/sitter in room Nurse Communication: Mobility status PT Visit Diagnosis: Difficulty in walking, not elsewhere classified (R26.2);Muscle  weakness (generalized) (M62.81);History of falling (Z91.81);Unsteadiness on feet (R26.81)    Time: 5631-4970 PT Time Calculation (min) (ACUTE ONLY): 32 min   Charges:   PT Evaluation $PT Eval Low Complexity: 1 Low PT Treatments $Therapeutic Activity: 8-22 mins        Debe Coder PT Acute Rehabilitation Services Pager [phone] Office [phone]   Beverely Suen 03/11/2021, 5:10 PM

## 2021-03-11 NOTE — Progress Notes (Signed)
  Echocardiogram 2D Echocardiogram has been performed.  Brennyn Ortlieb G Danil Wedge 03/11/2021, 11:38 AM

## 2021-03-11 DEATH — deceased

## 2021-03-12 DIAGNOSIS — N179 Acute kidney failure, unspecified: Secondary | ICD-10-CM | POA: Diagnosis not present

## 2021-03-12 DIAGNOSIS — D689 Coagulation defect, unspecified: Secondary | ICD-10-CM | POA: Diagnosis not present

## 2021-03-12 DIAGNOSIS — I471 Supraventricular tachycardia: Secondary | ICD-10-CM

## 2021-03-12 DIAGNOSIS — K831 Obstruction of bile duct: Secondary | ICD-10-CM | POA: Diagnosis not present

## 2021-03-12 DIAGNOSIS — E1165 Type 2 diabetes mellitus with hyperglycemia: Secondary | ICD-10-CM | POA: Diagnosis not present

## 2021-03-12 LAB — COMPREHENSIVE METABOLIC PANEL
ALT: 293 U/L — ABNORMAL HIGH (ref 0–44)
AST: 353 U/L — ABNORMAL HIGH (ref 15–41)
Albumin: 1.7 g/dL — ABNORMAL LOW (ref 3.5–5.0)
Alkaline Phosphatase: 1615 U/L — ABNORMAL HIGH (ref 38–126)
Anion gap: 10 (ref 5–15)
BUN: 43 mg/dL — ABNORMAL HIGH (ref 8–23)
CO2: 13 mmol/L — ABNORMAL LOW (ref 22–32)
Calcium: 8.4 mg/dL — ABNORMAL LOW (ref 8.9–10.3)
Chloride: 108 mmol/L (ref 98–111)
Creatinine, Ser: 1.09 mg/dL — ABNORMAL HIGH (ref 0.44–1.00)
GFR, Estimated: 54 mL/min — ABNORMAL LOW (ref >60)
Glucose, Bld: 222 mg/dL — ABNORMAL HIGH (ref 70–99)
Potassium: 6 mmol/L — ABNORMAL HIGH (ref 3.5–5.1)
Sodium: 131 mmol/L — ABNORMAL LOW (ref 135–145)
Total Bilirubin: 14.2 mg/dL — ABNORMAL HIGH (ref 0.3–1.2)
Total Protein: 5.2 g/dL — ABNORMAL LOW (ref 6.5–8.1)

## 2021-03-12 LAB — GLUCOSE, CAPILLARY
Glucose-Capillary: 220 mg/dL — ABNORMAL HIGH (ref 70–99)
Glucose-Capillary: 234 mg/dL — ABNORMAL HIGH (ref 70–99)
Glucose-Capillary: 237 mg/dL — ABNORMAL HIGH (ref 70–99)
Glucose-Capillary: 273 mg/dL — ABNORMAL HIGH (ref 70–99)

## 2021-03-12 LAB — PROTIME-INR
INR: 1.9 — ABNORMAL HIGH (ref 0.8–1.2)
Prothrombin Time: 21.8 seconds — ABNORMAL HIGH (ref 11.4–15.2)

## 2021-03-12 LAB — POTASSIUM
Potassium: 4.9 mmol/L (ref 3.5–5.1)
Potassium: 4.7 mmol/L (ref 3.5–5.1)
Potassium: 4.9 mmol/L (ref 3.5–5.1)
Potassium: 4.9 mmol/L (ref 3.5–5.1)

## 2021-03-12 MED ORDER — SODIUM CHLORIDE 0.9 % IV SOLN: INTRAVENOUS | Status: DC

## 2021-03-12 MED ORDER — INSULIN GLARGINE 100 UNIT/ML ~~LOC~~ SOLN
10.0000 [IU] | Freq: Every day | SUBCUTANEOUS | Status: DC
  Administered 2021-03-12 – 2021-03-13 (×2): 10 [IU] via SUBCUTANEOUS
  Filled 2021-03-12 (×2): qty 0.1

## 2021-03-12 MED ORDER — SODIUM ZIRCONIUM CYCLOSILICATE 10 G PO PACK
10.0000 g | PACK | Freq: Once | ORAL | Status: AC
  Administered 2021-03-12: 10 g via ORAL
  Filled 2021-03-12: qty 1

## 2021-03-12 MED ORDER — DILTIAZEM HCL 60 MG PO TABS
60.0000 mg | ORAL_TABLET | Freq: Four times a day (QID) | ORAL | Status: DC
  Administered 2021-03-12 – 2021-03-13 (×3): 60 mg via ORAL
  Filled 2021-03-12 (×3): qty 1

## 2021-03-12 MED ORDER — ALBUTEROL SULFATE (2.5 MG/3ML) 0.083% IN NEBU
2.5000 mg | INHALATION_SOLUTION | RESPIRATORY_TRACT | Status: DC | PRN
  Administered 2021-03-12: 2.5 mg via RESPIRATORY_TRACT
  Filled 2021-03-12 (×2): qty 3

## 2021-03-12 MED ORDER — VITAMIN K1 10 MG/ML IJ SOLN
10.0000 mg | Freq: Once | INTRAVENOUS | Status: AC
  Administered 2021-03-12: 10 mg via INTRAVENOUS
  Filled 2021-03-12: qty 1

## 2021-03-12 MED ORDER — FLUTICASONE PROPIONATE 50 MCG/ACT NA SUSP
1.0000 | Freq: Two times a day (BID) | NASAL | Status: DC
  Administered 2021-03-12 – 2021-03-21 (×16): 1 via NASAL
  Filled 2021-03-12 (×2): qty 16

## 2021-03-12 MED ORDER — ALBUTEROL SULFATE (2.5 MG/3ML) 0.083% IN NEBU
2.5000 mg | INHALATION_SOLUTION | Freq: Two times a day (BID) | RESPIRATORY_TRACT | Status: DC
  Administered 2021-03-12 – 2021-03-21 (×17): 2.5 mg via RESPIRATORY_TRACT
  Filled 2021-03-12 (×18): qty 3

## 2021-03-12 MED ORDER — SODIUM CHLORIDE 0.9 % IV BOLUS
500.0000 mL | Freq: Once | INTRAVENOUS | Status: AC
  Administered 2021-03-12: 500 mL via INTRAVENOUS

## 2021-03-12 MED ORDER — FUROSEMIDE 10 MG/ML IJ SOLN
40.0000 mg | Freq: Once | INTRAMUSCULAR | Status: AC
  Administered 2021-03-12: 40 mg via INTRAVENOUS
  Filled 2021-03-12: qty 4

## 2021-03-12 NOTE — Progress Notes (Signed)
PROGRESS NOTE  Laura Sherman IWO:[id number] DOB: 1951-05-29 DOA: 03/09/2021 PCP: Reita Cliche, MD  Brief History   70 year old woman PMH breast cancer, cervical or uterine cancer, diabetes mellitus type 2, hypertension presented to the emergency department with 1 week history of failure to thrive, anorexia, fatigue.  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  Seen by gastroenterology with plans for ERCP and EUS once coagulopathy corrected.  A & P  Obstructive jaundice, elevated LFTs, abnormal CT and MRI concerning for pancreatic malignancy. --Labs stable clinically stable.  ERCP/EUS when coagulopathy corrected as per GI.    Pancreatic mass, multiple lung nodules, multiple hepatic lesions consistent with metastatic disease.  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 -- Plan ERCP EUS when coagulopathy corrected. --PMH of endometrial carcinoma and breast cancer noted --Patient wants to follow-up with her oncologist in [facility] Dr. Dustin Folks, I discussed by telephone with him today.  He will arrange for close outpatient follow-up.   Coagulopathy, most likely secondary to metastatic liver disease -- Remains coagulopathic preventing biopsy. -- Status post vitamin K yesterday with INR down to 1.9.  Further vitamin K per GI.   Acute kidney injury secondary to poor oral intake, prerenal azotemia.  No cardiac history. -- Creatinine stable but BUN remains high.  Will restart fluids. --Mild hyperkalemia addressed today with fluids and Lasix.  SVT --TSH within normal limits.  2D echocardiogram unremarkable.  --Electrolytes stable.  Appreciate cardiology.  Continue Lopressor, diltiazem added.  Diabetes mellitus type 2 with hyperglycemia hemoglobin A1c 8.0 -- Continue to hold oral medications.  Still hyperglycemic.  Increase Lantus.  Continue sliding scale insulin.   Hyponatremia secondary to poor oral intake,  corrected 131 -- resolved.   Aortic atherosclerosis -- No treatment indicated  RESOLVED Hyperkalemia secondary to acute kidney injury -- Resolved with treatment and IV fluids.  Hypercalcemia, concerning given suspected malignancy -- Resolved with hydration.  Trivial troponin elevation -- Troponins flat, no further evaluation suggested.  Unclear why checked, no signs or symptoms to suggest ACS.  EKG nonacute.   PMH . Endometrial carcinoma status post robotic assisted hysterectomy and bilateral salpingo-oophorectomy in 2014. Pathology well-differentiated endometrial carcinoma with negative nodes . Breast cancer, 2006 ductal adenocarcinoma status right lumpectomy, radiation and chemo post mastectomy ER positive HER-2/neu negative completed antiestrogen therapy   Disposition Plan:  Discussion:   Status is: Inpatient  Remains inpatient appropriate because:Ongoing diagnostic testing needed not appropriate for outpatient work up, IV treatments appropriate due to intensity of illness or inability to take PO and Inpatient level of care appropriate due to severity of illness   Dispo: The patient is from: Home              Anticipated d/c is to: Home              Patient currently is not medically stable to d/c.   Difficult to place patient No  DVT prophylaxis: SCDs Start: 02/26/2021 1912   Code Status: DNR Level of care: Telemetry Family Communication: daughter at bedside  Murray Hodgkins, MD  [facility] Direct contact: see [email or web address] (further directions at bottom of note if needed) 7PM-7AM contact night coverage as at bottom of note 03/12/2021, 4:25 PM  LOS: 4 days   Significant Hospital Events   . 4/28 admit for obstructive jaundice   Consults:  . GI . Cardiology    Procedures:  .   Significant Diagnostic Tests:  . CT chest, abd, pelvis w/ pancreatic mass,  hepatic and lung lesions concerning for metastatic disease . MRI w/ multiple liver lesions c/w metastatic  disease, associated with biliary and pancreatic duct obstruction, lesion in the head of the pancreas, suspected pancreatic adenocarcinoma, multiple pulmonary nodules   Micro Data:  . None    Antimicrobials:  . None   Interval History/Subjective  CC: f/u jaundice  Feels ok, tolerating liquid diet No n/v No pain  Objective   Vitals:  Vitals:   03/12/21 1338 03/12/21 1350  BP: 137/78   Pulse:    Resp:    Temp:    SpO2:  94%    Exam:  Constitutional:   . Appears calm and comfortable Respiratory:  . CTA bilaterally, no w/r/r.  . Respiratory effort normal.  Cardiovascular:  . RRR, no m/r/g . No LE extremity edema   Abdomen:  . soft Psychiatric:  . Mental status o Mood, affect appropriate  I have personally reviewed the following:   Today's Data  . CBG 200s . K+ 6 > 4.9 > 4.7 . LFTs w/o sig change . INR down to 1.9  Scheduled Meds: . albuterol  2.5 mg Nebulization BID  . budesonide  0.25 mg Nebulization BID  . diltiazem  60 mg Oral Q6H  . fluticasone  1 spray Each Nare BID  . guaiFENesin  600 mg Oral BID  . insulin aspart  0-15 Units Subcutaneous TID WC  . insulin aspart  0-5 Units Subcutaneous QHS  . insulin glargine  10 Units Subcutaneous Daily  . metoprolol tartrate  100 mg Oral BID  . pantoprazole  40 mg Oral Daily  . sodium chloride flush  3 mL Intravenous Q12H   Continuous Infusions: . sodium chloride 100 mL/hr at 03/12/21 1235    Principal Problem:   Obstructive jaundice due to malignant neoplasm (HCC) Active Problems:   AKI (acute kidney injury) ([place])   Hyperkalemia   DM type 2 (diabetes mellitus, type 2) ([place])   Coagulopathy ([place])   Aortic atherosclerosis ([place])   Hypercalcemia   LOS: 4 days   How to contact the [facility] Attending or Consulting provider [place] or covering provider during after hours [place], for this patient?  1. Check the care team in [facility] and look for a) attending/consulting TRH provider listed and b) the [facility] team  listed 2. Log into [email or web address] and use [place]'s universal password to access. If you do not have the password, please contact the hospital operator. 3. Locate the [facility] provider you are looking for under [facility] and page to a number that you can be directly reached. 4. If you still have difficulty reaching the provider, please page the [facility] (Director on Call) for the Hospitalists listed on amion for assistance.

## 2021-03-12 NOTE — Consult Note (Signed)
Cardiology Consultation:   Patient ID: Laura Sherman MRN: [id number]; DOB: 07/02/1951  Admit date: 03/09/2021 Date of Consult: 03/12/2021  PCP:  Reita Cliche, MD   [place]  Cardiologist:  New (Dr. Radford Pax) Electrophysiologist:  None    Patient Profile:   TAVA MOHS is a 70 y.o. female with a history of hypertension, diabetes mellitus, GERD, breast cancer in 2005 s/p chemo and radiation, and uterine cancer in 2010 s/p total hysterectomy who is being seen today for the evaluation of SVT at the request of Dr. Sarajane Jews.  History of Present Illness:   Laura Sherman is a 70 year old female with the above history.  She has no known cardiac history.  She states she did see a cardiologist prior to Port-A-Cath placement before being treated for breast cancer.  However, she denies any cardiac testing and has not followed with a cardiologist since.  Patient presented to the ED on 03/09/2019 for further evaluation of significant weakness with failure to thrive, anorexia, and fatigue. 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 GI consulted and plan is for ERCP and EUS once coagulopathy corrected. Cardiology was consulted today for assistance with recurrent SVT.   At the time of this evaluation, patient resting comfortably in bed.  She states she presented for significant weakness.  She denies any palpitations, lightheadedness, dizziness, syncope.  No chest pain or shortness of breath.  She does states she has slept on an incline for years due to shortness of breath when laying flat but states this is stable.  No PND.  She has chronic lower extremity edema following lymph node removal time of uterine cancer.  However, she states that lower extremity edema has actually been better than usual.  She denies any GI symptoms.  She states she did not  notice that she was jaundiced; however, her daughter reportedly noticed her jaundice last Thursday.   Past Medical History:  Diagnosis Date  . Arthritis   . Asthma   . Breast cancer ([place]) 2005   chemo, XRT  . Diabetes mellitus without complication ([place])   . Environmental allergies   . GERD (gastroesophageal reflux disease)   . Hypertension   . Uterine cancer ([facility]) 2010   total hysterectomy, no chemo or XRT    Past Surgical History:  Procedure Laterality Date  . ABDOMINAL HYSTERECTOMY    . BACK SURGERY    . BREAST SURGERY  2005   chemo, XRT  . TONSILLECTOMY       Home Medications:  Prior to Admission medications   Medication Sig Start Date End Date Taking? Authorizing Provider  acetaminophen (TYLENOL) 650 MG CR tablet Take 650 mg by mouth every 8 (eight) hours as needed for pain.   Yes [provider]  albuterol (VENTOLIN HFA) 108 (90 Base) MCG/ACT inhaler Inhale 2 puffs into the lungs every 6 (six) hours as needed for wheezing or shortness of breath.   Yes [provider]  amLODipine (NORVASC) 10 MG tablet Take 10 mg by mouth daily.   Yes [provider]  atorvastatin (LIPITOR) 20 MG tablet Take 20 mg by mouth daily.   Yes [provider]  B Complex Vitamins (VITAMIN B COMPLEX PO) Take 1 tablet by mouth daily.   Yes [provider]  calcium carbonate (OSCAL) 1500 (600 Ca) MG TABS tablet Take 600 mg of elemental calcium  by mouth daily.   Yes [provider]  diclofenac (VOLTAREN) 75 MG EC tablet Take 75 mg by mouth 2 (two) times daily.   Yes [provider]  ergocalciferol (VITAMIN D2) 1.25 MG (50000 UT) capsule Take 50,000 Units by mouth every 14 (fourteen) days. 08/12/16  Yes [provider]  fexofenadine (ALLEGRA) 60 MG tablet Take 60 mg by mouth daily.   Yes [provider]  fluticasone (FLONASE) 50 MCG/ACT nasal spray Place 1 spray into both nostrils daily.   Yes [provider]   fluticasone (FLOVENT DISKUS) 50 MCG/BLIST diskus inhaler Inhale 1 puff into the lungs 2 (two) times daily.   Yes [provider]  glipiZIDE (GLUCOTROL) 5 MG tablet Take 2.5 mg by mouth daily. 02/20/21  Yes [provider]  hydrochlorothiazide (HYDRODIURIL) 25 MG tablet Take 25 mg by mouth daily.   Yes [provider]  loratadine (CLARITIN) 10 MG tablet Take 10 mg by mouth daily.   Yes [provider]  losartan-hydrochlorothiazide (HYZAAR) 100-25 MG tablet Take 1 tablet by mouth daily.   Yes [provider]  metFORMIN (GLUCOPHAGE) 500 MG tablet Take by mouth 2 (two) times daily with a meal.   Yes [provider]  metoprolol (LOPRESSOR) 100 MG tablet Take 100 mg by mouth 2 (two) times daily.   Yes [provider]  montelukast (SINGULAIR) 10 MG tablet Take 10 mg by mouth daily. 02/24/21  Yes [provider]  Multiple Vitamin (MULTIVITAMIN WITH MINERALS) TABS tablet Take 1 tablet by mouth daily.   Yes [provider]  olmesartan (BENICAR) 40 MG tablet Take 40 mg by mouth daily. 01/08/21  Yes [provider]  omeprazole (PRILOSEC) 10 MG capsule Take 10 mg by mouth daily.   Yes [provider]  oxymetazoline (AFRIN) 0.05 % nasal spray Place 1 spray into both nostrils daily as needed for congestion.   Yes [provider]  pioglitazone (ACTOS) 30 MG tablet Take 30 mg by mouth daily.   Yes [provider]  Pseudoephedrine-Guaifenesin [id number] MG TB12 Take 1 tablet by mouth 2 (two) times daily.   Yes [provider]  Vitamin E 100 units TABS Take 100 Units by mouth daily.   Yes [provider]    Inpatient Medications: Scheduled Meds: . amLODipine  10 mg Oral Daily  . budesonide  0.25 mg Nebulization BID  . fluticasone  1 spray Each Nare BID  . guaiFENesin  600 mg Oral BID  . insulin aspart  0-15 Units Subcutaneous TID WC  . insulin aspart  0-5 Units Subcutaneous QHS  .  insulin glargine  10 Units Subcutaneous Daily  . metoprolol tartrate  100 mg Oral BID  . pantoprazole  40 mg Oral Daily  . sodium chloride flush  3 mL Intravenous Q12H   Continuous Infusions: . sodium chloride    . phytonadione (VITAMIN K) IV    . sodium chloride     PRN Meds: guaiFENesin-dextromethorphan, ondansetron **OR** ondansetron (ZOFRAN) IV  Allergies:    Allergies  Allergen Reactions  . Erythromycin Nausea Only    Other Reactions: GI Upset Other Reactions: GI Upset   . Penicillins Rash  . Sulfa Antibiotics Nausea Only    Other Reactions: GI Upset  . Tape Rash    Bandaid    Social History:   Social History   Socioeconomic History  . Marital status: Married    Spouse name: Not on file  . Number of children: Not on file  .  Years of education: Not on file  . Highest education level: Not on file  Occupational History  . Not on file  Tobacco Use  . Smoking status: Never Smoker  . Smokeless tobacco: Never Used  Vaping Use  . Vaping Use: Never used  Substance and Sexual Activity  . Alcohol use: No  . Drug use: No  . Sexual activity: Not on file  Other Topics Concern  . Not on file  Social History Narrative  . Not on file   Social Determinants of Health   Financial Resource Strain: Not on file  Food Insecurity: Not on file  Transportation Needs: Not on file  Physical Activity: Not on file  Stress: Not on file  Social Connections: Not on file  Intimate Partner Violence: Not on file    Family History:    Family History  Problem Relation Age of Onset  . COPD Mother      ROS:  Please see the history of present illness.  All other ROS reviewed and negative.     Physical Exam/Data:   Vitals:   03/12/21 0147 03/12/21 0544 03/12/21 0758 03/12/21 0952  BP: 121/65 (!) 145/77  138/78  Pulse: 76 90  (!) 121  Resp: (!) 27 (!) 26  20  Temp: 98.2 F (36.8 C) 98.1 F (36.7 C)  98.2 F (36.8 C)  TempSrc: Oral Oral  Axillary  SpO2: 92% 94% 95% 94%   Weight:      Height:        Intake/Output Summary (Last 24 hours) at 03/12/2021 1010 Last data filed at 03/12/2021 0547 Gross per 24 hour  Intake --  Output 450 ml  Net -450 ml   Last 3 Weights 02/14/2021  Weight (lbs) 216 lb  Weight (kg) 97.977 kg     Body mass index is 38.26 kg/m.  General: 70 y.o. female resting comfortably in no acute distress. Jaundice. HEENT: Normocephalic and atraumatic. Sclera clear.  Neck: Supple. No carotid bruits. No JVD. Heart: RRR. Distinct S1 and S2. No murmurs, gallops, or rubs.  Lungs: No increased work of breathing. Clear to ausculation bilaterally. No wheezes, rhonchi, or rales.  Abdomen: Soft, distended, and non-tender to palpation. Bowel sounds present. Extremities: No significant lower extremity edema.    Skin: Warm and dry. Jaundice. Neuro: Alert and oriented x3. No focal deficits. Psych: Normal affect. Responds appropriately.  EKG:  The EKG from 03/12/2021 was personally reviewed and demonstrates:  Normal sinus rhythm with no acute ST/T changes. Left axis deviation. Normal PR and QRS intervals. QTc 427 ms. - Other EKGs from this admission also reviewed and also show normal sinus rhythm with no acute ischemic changes.   Telemetry:  Telemetry was personally reviewed and demonstrates: Underlying sinus rhythm with rates in the 80's. Rates occasionally in the 120's. She has a few runs of SVT/PAT - most of these runs are short but did have a 2 min episode this morning with rates in the 160's to 170's.  Relevant CV Studies:  Echocardiogram 03/11/2021: Impressions: 1. Left ventricular ejection fraction, by estimation, is 70 to 75%. The  left ventricle has hyperdynamic function. The left ventricle has no  regional wall motion abnormalities. There is mild left ventricular  hypertrophy. Left ventricular diastolic  parameters are consistent with Grade I diastolic dysfunction (impaired  relaxation).  2. Right ventricular systolic function is normal. The  right ventricular  size is normal.  3. Left atrial size was mildly dilated.  4. The mitral valve is normal  in structure. No evidence of mitral valve  regurgitation. No evidence of mitral stenosis. Moderate mitral annular  calcification.  5. The aortic valve is normal in structure. Aortic valve regurgitation is  not visualized. No aortic stenosis is present.  6. The inferior vena cava is normal in size with greater than 50%  respiratory variability, suggesting right atrial pressure of 3 mmHg.   Laboratory Data:  High Sensitivity Troponin:   Recent Labs  Lab 02/23/2021 1113 03/07/2021 1420 03/11/21 0505 03/11/21 0804  TROPONINIHS 30* 29* 23* 27*     Chemistry Recent Labs  Lab 03/10/21 0424 03/11/21 0505 03/12/21 0502  NA 132* 137 131*  K 4.9 5.2* 6.0*  CL 105 112* 108  CO2 18* 17* 13*  GLUCOSE 192* 178* 222*  BUN 48* 41* 43*  CREATININE 1.24* 1.09* 1.09*  CALCIUM 8.7* 8.8* 8.4*  GFRNONAA 47* 54* 54*  ANIONGAP 9 8 10     Recent Labs  Lab 03/10/21 0424 03/11/21 0505 03/12/21 0502  PROT 6.2* 6.0* 5.2*  ALBUMIN 2.2* 2.1* 1.7*  AST 410* 436* 353*  ALT 336* 338* 293*  ALKPHOS 1,622* 1,710* 1,615*  BILITOT 13.6* 13.3* 14.2*   Hematology Recent Labs  Lab 02/15/2021 1113 03/09/21 0317  WBC 10.4 9.4  RBC 3.61* 3.35*  HGB 10.4* 9.8*  HCT 31.3* 29.4*  MCV 86.7 87.8  MCH 28.8 29.3  MCHC 33.2 33.3  RDW 16.4* 16.7*  PLT 282 249   BNPNo results for input(s): BNP, PROBNP in the last 168 hours.  DDimer  Recent Labs  Lab 03/10/21 2211  DDIMER 5.68*     Radiology/Studies:  DG Chest Port 1 View  Result Date: 02/24/2021 CLINICAL DATA:  Cough.  Fatigue. EXAM: PORTABLE CHEST 1 VIEW COMPARISON:  Chest x-ray 12/24/2015. FINDINGS: Mediastinum and hilar structures normal. Cardiomegaly. No pulmonary venous congestion. Pulmonary nodular opacities are noted bilaterally. An infectious process or metastatic disease cannot be excluded. Contrast-enhanced CT of the chest  suggested for further evaluation. Mild bibasilar atelectasis. No pleural effusion or pneumothorax. Degenerative change thoracic spine. Surgical clips right chest. IMPRESSION: Nodular opacities are noted over both lungs. An infectious process or metastatic disease cannot be excluded. Contrast-enhanced CT of the chest suggested for further evaluation. Electronically Signed   By: Marcello Moores  Register   On: 03/03/2021 11:50   DG Knee Right Port  Result Date: 02/25/2021 CLINICAL DATA:  Fall with knee pain EXAM: PORTABLE RIGHT KNEE - 1-2 VIEW COMPARISON:  12/24/2015 FINDINGS: Small knee joint effusion. Advanced degenerative change of the patellofemoral joint. Degenerative change also of the weight-bearing compartments with marginal osteophytes, worse lateral than medial. Multiple intra-articular loose bodies. No acute traumatic finding. IMPRESSION: No acute traumatic finding. 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 Small joint effusion. Electronically Signed   By: Nelson Chimes M.D.   On: 02/18/2021 11:58   MR ABDOMEN MRCP W WO CONTAST  Result Date: 03/10/2021 CLINICAL DATA:  High of is 4% a correspond down there insert no Radian that and rat I was still at so ileal little blue Christmas tree with a syringe the all a EM least [place]ray at yes not use deformity in 90s knee cell identity is a few years ago and asked the technologist to upstream catheters of attic at both hands tied right and I can be of the paddle with initial exam she had say none of the images medially in the that guys like stones image from the ulnar FX. The pancreatic head lesion on CT scan. EXAM: MRI ABDOMEN  WITHOUT AND WITH CONTRAST (INCLUDING MRCP) TECHNIQUE: Multiplanar multisequence MR imaging of the abdomen was performed both before and after the administration of intravenous contrast. Heavily T2-weighted images of the biliary and pancreatic ducts were obtained, and three-dimensional MRCP images were rendered by  post processing. CONTRAST:  59mL GADAVIST GADOBUTROL 1 MMOL/ML IV SOLN COMPARISON:  CT scan 02/28/2021 FINDINGS: Lower chest: As seen on recent CT scan, pulmonary nodules are noted in the lung bases bilaterally. Hepatobiliary: Multiple rim enhancing liver lesions are evident, including a dominant irregular segment 4 lesion measuring 3.7 x 5.1 cm. These hepatic lesions demonstrate peripheral rim enhancement and restricted diffusion, consistent with metastatic disease. Gallbladder is contracted with numerous intraluminal stones. Diffuse intrahepatic biliary duct dilatation is noted in the right and left hepatic lobes. MRCP imaging is markedly motion degraded but the level of biliary obstruction appears to be in the hepatoduodenal ligament. Pancreas: Diffuse dilatation of the main pancreatic duct noted with abrupt cut off in the head of pancreas. There is abrupt cut off of the common bile duct at the level of the pancreatic head as well.16 mm rim enhancing lesion is identified in the head of the pancreas, corresponding to the site of pancreatic duct and biliary obstruction. Abnormal soft tissue showing some rim enhancement is identified in the hepatoduodenal ligament, apparently encasing the common hepatic artery and involving the proximal splenic artery although both vessels remain patent. There is marked attenuation of the portal vein although this, too, remains patent. SMV and splenic vein are patent. Celiac axis and SMA may share in origin (no sagittal imaging as part of this study to further evaluate) with preserved fat plane around the proximal SMA. There is abnormal soft tissue in the celiac trifurcation. Spleen:  No splenomegaly. No focal mass lesion. Adrenals/Urinary Tract: No adrenal nodule or mass. Kidneys unremarkable. Stomach/Bowel: Stomach is nondistended Duodenum is normally positioned as is the ligament of Treitz. No small bowel or colonic dilatation within the visualized abdomen. Vascular/Lymphatic: No  abdominal aortic aneurysm. See pancreas section above. Mild lymphadenopathy noted in the porta hepatis. Other:  No substantial intraperitoneal free fluid. Musculoskeletal: No focal suspicious marrow enhancement within the visualized bony anatomy. IMPRESSION: 1. Multiple rim enhancing liver lesions consistent with metastatic disease. This finding is associated with biliary and pancreatic duct obstruction in the pancreatic head region. There is a small rim enhancing lesion in the head of pancreas. Imaging features likely reflect pancreatic adenocarcinoma with metastatic disease. Central cholangiocarcinoma with metastatic spread into the porta hepatis is considered less likely but not excluded. EUS/ERCP would likely prove helpful to further evaluate. 2. Disease in the hepatoduodenal ligament generates substantial mass-effect on the portal vein and appears to encase the common hepatic artery extending into the region of the celiac trifurcation. Major arterial and venous anatomy of the central abdomen is patent at this time. 3. Multiple pulmonary nodules consistent with metastatic disease. 4. Cholelithiasis. Electronically Signed   By: Misty Stanley M.D.   On: 03/10/2021 09:14   ECHOCARDIOGRAM COMPLETE  Result Date: 03/11/2021    ECHOCARDIOGRAM REPORT   Patient Name:   Durel Salts Date of Exam: 03/11/2021 Medical Rec #:  [id number]        Height:       63.0 in Accession #:    [id number]       Weight:       216.0 lb Date of Birth:  08-Jul-1951        BSA:  1.998 m Patient Age:    48 years         BP:           134/83 mmHg Patient Gender: F                HR:           92 bpm. Exam Location:  Inpatient Procedure: 2D Echo, Cardiac Doppler, Color Doppler and Intracardiac            Opacification Agent Indications:    I47.1 SVT  History:        Patient has no prior history of Echocardiogram examinations.                 Risk Factors:Hypertension and Diabetes. GERD. Cancer.  Sonographer:    Jonelle Sidle Dance Referring  Phys: [place]  1. Left ventricular ejection fraction, by estimation, is 70 to 75%. The left ventricle has hyperdynamic function. The left ventricle has no regional wall motion abnormalities. There is mild left ventricular hypertrophy. Left ventricular diastolic parameters are consistent with Grade I diastolic dysfunction (impaired relaxation).  2. Right ventricular systolic function is normal. The right ventricular size is normal.  3. Left atrial size was mildly dilated.  4. The mitral valve is normal in structure. No evidence of mitral valve regurgitation. No evidence of mitral stenosis. Moderate mitral annular calcification.  5. The aortic valve is normal in structure. Aortic valve regurgitation is not visualized. No aortic stenosis is present.  6. The inferior vena cava is normal in size with greater than 50% respiratory variability, suggesting right atrial pressure of 3 mmHg. FINDINGS  Left Ventricle: Left ventricular ejection fraction, by estimation, is 70 to 75%. The left ventricle has hyperdynamic function. The left ventricle has no regional wall motion abnormalities. Definity contrast agent was given IV to delineate the left ventricular endocardial borders. The left ventricular internal cavity size was normal in size. There is mild left ventricular hypertrophy. Left ventricular diastolic parameters are consistent with Grade I diastolic dysfunction (impaired relaxation). Right Ventricle: The right ventricular size is normal. No increase in right ventricular wall thickness. Right ventricular systolic function is normal. Left Atrium: Left atrial size was mildly dilated. Right Atrium: Right atrial size was normal in size. Pericardium: There is no evidence of pericardial effusion. Mitral Valve: The mitral valve is normal in structure. Moderate mitral annular calcification. No evidence of mitral valve regurgitation. No evidence of mitral valve stenosis. Tricuspid Valve: The tricuspid  valve is normal in structure. Tricuspid valve regurgitation is not demonstrated. No evidence of tricuspid stenosis. Aortic Valve: The aortic valve is normal in structure. Aortic valve regurgitation is not visualized. No aortic stenosis is present. Pulmonic Valve: The pulmonic valve was normal in structure. Pulmonic valve regurgitation is not visualized. No evidence of pulmonic stenosis. Aorta: The aortic root is normal in size and structure. Venous: The inferior vena cava is normal in size with greater than 50% respiratory variability, suggesting right atrial pressure of 3 mmHg. IAS/Shunts: No atrial level shunt detected by color flow Doppler.  LEFT VENTRICLE PLAX 2D LVIDd:         4.60 cm  Diastology LVIDs:         2.60 cm  LV e' medial:    7.62 cm/s LV PW:         1.20 cm  LV E/e' medial:  11.1 LV IVS:        1.10 cm  LV e' lateral:  8.05 cm/s LVOT diam:     2.00 cm  LV E/e' lateral: 10.5 LV SV:         61 LV SV Index:   31 LVOT Area:     3.14 cm  RIGHT VENTRICLE             IVC RV Basal diam:  3.50 cm     IVC diam: 1.90 cm RV Mid diam:    2.80 cm RV S prime:     20.10 cm/s TAPSE (M-mode): 2.0 cm LEFT ATRIUM             Index       RIGHT ATRIUM           Index LA diam:        3.60 cm 1.80 cm/m  RA Area:     11.70 cm LA Vol (A2C):   82.2 ml 41.14 ml/m RA Volume:   24.70 ml  12.36 ml/m LA Vol (A4C):   49.1 ml 24.58 ml/m LA Biplane Vol: 69.0 ml 34.54 ml/m  AORTIC VALVE LVOT Vmax:   103.00 cm/s LVOT Vmean:  77.000 cm/s LVOT VTI:    0.194 m  AORTA Ao Root diam: 3.00 cm Ao Asc diam:  3.80 cm MITRAL VALVE MV Area (PHT): 2.87 cm     SHUNTS MV Decel Time: 264 msec     Systemic VTI:  0.19 m MV E velocity: 84.60 cm/s   Systemic Diam: 2.00 cm MV A velocity: 117.00 cm/s MV E/A ratio:  0.72 Candee Furbish MD Electronically signed by Candee Furbish MD Signature Date/Time: 03/11/2021/12:06:10 PM    Final    CT CHEST ABDOMEN PELVIS WO CONTRAST  Result Date: 02/26/2021 CLINICAL DATA:  Weakness.  Recent fall. EXAM: CT CHEST,  ABDOMEN AND PELVIS WITHOUT CONTRAST TECHNIQUE: Multidetector CT imaging of the chest, abdomen and pelvis was performed following the standard protocol without IV contrast. COMPARISON:  None. FINDINGS: CT CHEST FINDINGS Cardiovascular: Atherosclerosis of thoracic aorta is noted without aneurysm formation. Normal cardiac size. No pericardial effusion. Coronary artery calcifications are noted. Mediastinum/Nodes: Thyroid gland is unremarkable. Esophagus is unremarkable. 2 cm subcarinal lymph node is noted. 1.5 cm right paratracheal lymph node is noted. Lungs/Pleura: No pneumothorax or pleural effusion is noted. Multiple nodules are noted throughout both lungs concerning for metastatic disease. The largest measures 1.9 cm in left lower lobe. Musculoskeletal: No chest wall mass or suspicious bone lesions identified. CT ABDOMEN PELVIS FINDINGS Hepatobiliary: Cholelithiasis is noted. Multiple ill-defined hypoechoic areas are noted throughout the liver concerning for metastatic disease. The largest measures 5.2 x 4.3 cm in the anterior portion of the right hepatic lobe. Mild intrahepatic biliary dilatation is noted concerning for obstruction of the distal common bile duct which may be due to possible pancreatic head mass measuring 2.7 cm. Pancreas: As noted above, possible low density is noted in the pancreatic head which is ill-defined and measures approximately 2.7 cm, concerning for possible pancreatic malignancy. Spleen: Normal in size without focal abnormality. Adrenals/Urinary Tract: Adrenal glands are unremarkable. Kidneys are normal, without renal calculi, focal lesion, or hydronephrosis. Bladder is unremarkable. Stomach/Bowel: Stomach is within normal limits. Appendix appears normal. No evidence of bowel wall thickening, distention, or inflammatory changes. Vascular/Lymphatic: Atherosclerosis of abdominal aorta is noted without aneurysm formation. Periaortic adenopathy is noted concerning for metastatic disease. The  largest lymph node measures 9 mm in minor axis. Reproductive: Status post hysterectomy. No adnexal masses. Other: Minimal free fluid is noted in the pelvis. No definite hernia is  noted. Musculoskeletal: No acute or significant osseous findings. IMPRESSION: Multiple pulmonary nodules are noted consistent with metastatic disease. Multiple ill-defined hypoechoic areas are noted in the hepatic parenchyma concerning for metastatic disease. The largest such lesion measures 5.2 cm in the right hepatic lobe. Possible well-defined low density seen in pancreatic head which measures 2.7 cm and is concerning for possible pancreatic malignancy. Further evaluation with MRI or CT scan with intravenous contrast is recommended. Mild intrahepatic biliary dilatation is noted which may be due to obstruction secondary to this mass. Periaortic adenopathy is noted concerning for metastatic disease. Coronary artery calcifications are noted suggesting coronary artery disease. Cholelithiasis. Aortic Atherosclerosis (ICD10-I70.0). Electronically Signed   By: Marijo Conception M.D.   On: Mar 18, 2021 12:55     Assessment and Plan:   Paroxysmal SVT/PAT - Patient presented with weakness and jaundice and found to have suspected pancreatic cancer with metastasis to liver and lungs. Cardiology consulted for paroxysmal SVT. - Reviewed telemetry - in sinus rhythm with baseline rates in the 80's. A few episodes of what looks like likely PAT. Most episodes are very short but did have a 2 minute episodes this morning with rates in the 160's to 170's. - Initially hyperkalemic but this has improved.  - Magnesium 2.1.  - TSH normal. - Echo showed LVEF of 70-75% with normal wall motion and grade 1 diastolic dysfunction. - Continue Lopressor 100mg  twice daily.  - Stop Amlodipine and start Diltiazem 60mg  every 6 hours. Can consolidate prior to discharge.  - Continue to monitor on telemetry.  Hypertension - BP well controlled. - Continue  Lopressor 100mg  twice daily. - Will stop Amlodipine and start Diltiazem as above for additional rate control.  AKI - Creatinine 2.38 on admission. Secondary to poor oral intake. Improved with IV fluids.  - Creatinine 1.09 today. - Continue to avoid nephrotoxic agents.  - Continue to monitor closely.   Hyperkalemia - Potassium 6.5 on admission. Treated with Lokelma with improvement. - Potassium 6.0 on initial labs this morning. Received another dose of Lokelma and repeat 4.9. - Management per primary team.  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

## 2021-03-12 NOTE — Care Management Important Message (Signed)
Important Message  Patient Details  IM Letter given to the Patient. Name: Laura Sherman MRN: [id number] Date of Birth: [birth year]   Medicare Important Message Given:  Yes     Kerin Salen 03/12/2021, 11:59 AM

## 2021-03-12 NOTE — Progress Notes (Signed)
[facility] Gastroenterology Progress Note  Laura Sherman 70 y.o. [birth year]  CC:  Pancreatic mass, obstructive jaundice  Subjective: Patient denies any abdominal pain, nausea, or vomiting. Is tolerating full liquids and is interested in advancing to soft diet. Reports a small BM last night.  ROS : Review of Systems  Cardiovascular: Negative for chest pain and palpitations.  Gastrointestinal: Negative for abdominal pain, blood in stool, constipation, diarrhea, heartburn, melena, nausea and vomiting.    Objective: Vital signs in last 24 hours: Vitals:   03/12/21 0544 03/12/21 0758  BP: (!) 145/77   Pulse: 90   Resp: (!) 26   Temp: 98.1 F (36.7 C)   SpO2: 94% 95%    Physical Exam:  General:  Alert, oriented, cooperative, no distress, jaundiced  Head:  Normocephalic, without obvious abnormality, atraumatic  Eyes:  Deep icterus, EOMs intact  Lungs:   Clear to auscultation bilaterally, respirations unlabored  Heart:  Regular rate and rhythm, S1, S2 normal  Abdomen:   Soft, non-tender, non-distended, bowel sounds active all four quadrants  Extremities: Extremities normal, atraumatic, no  edema  Pulses: 2+ and symmetric    Lab Results: Recent Labs    03/09/21 0926 03/09/21 0926 03/10/21 0424 03/11/21 0505 03/12/21 0502  NA  --    < > 132* 137 131*  K  --    < > 4.9 5.2* 6.0*  CL  --    < > 105 112* 108  CO2  --    < > 18* 17* 13*  GLUCOSE  --    < > 192* 178* 222*  BUN  --    < > 48* 41* 43*  CREATININE  --    < > 1.24* 1.09* 1.09*  CALCIUM  --    < > 8.7* 8.8* 8.4*  MG 1.8  --  2.3 2.1  --   PHOS 2.7  --  2.8  --   --    < > = values in this interval not displayed.   Recent Labs    03/11/21 0505 03/12/21 0502  AST 436* 353*  ALT 338* 293*  ALKPHOS 1,710* 1,615*  BILITOT 13.3* 14.2*  PROT 6.0* 5.2*  ALBUMIN 2.1* 1.7*   No results for input(s): WBC, NEUTROABS, HGB, HCT, MCV, PLT in the last 72 hours. Recent Labs    03/10/21 0943 03/11/21 0505  LABPROT  35.6* 32.6*  INR 3.6* 3.2*    Assessment: Obstructive jaundice: pancreatic mass with pulmonary and liver metastases. Biliary and pancreatic duct obstruction in the pancreatic head region. Imaging features likely reflect pancreatic adenocarcinoma with metastatic disease -T. Bili 14.2/ AST 353/ ALT 293/ ALP 1615  Coagulopathy, likely related to liver metastases: INR 3.2 yesterday; today's INR pending  AKI, improving: Cr 1.09 today  History of breast cancer and uterine adenocarcinoma  Plan: Plan for EUS (Dr. Paulita Fujita) and ERCP with stent placement (Dr. Therisa Doyne) on Wednesday 5/4, presuming INR <2.0.  Vitamin K 10 mg ordered.  Soft diet OK.   Continue to trend LFTs.  Continue to trend INR.  [facility] will follow.  Salley Slaughter PA-C 03/12/2021, 8:46 AM  Contact #  [phone]

## 2021-03-12 NOTE — TOC Initial Note (Signed)
Transition of Care [facility]) - Initial/Assessment Note   Patient Details  Name: Laura Sherman MRN: [id number] Date of Birth: 21-Sep-1951  Transition [facility]) CM/SW Contact:    Sherie Don, LCSW Phone Number: 03/12/2021, 1:31 PM  Clinical Narrative: Patient is a 70 year old female who was admitted for obstructive jaundice due to malignant neoplasm. Readmission checklist completed due to high readmission score.  CSW met with patient and her husband to complete assessment and discuss PT recommendations. Per patient, she resides at home with her husband. Prior to her admission, patient was independent with her ADLs at baseline. Patient is able to afford her medications each month and takes these as prescribed. Current DME includes a rolling walker, cane, and rollator. Patient transports herself to medical appointments.  CSW asked patient about HH vs. SNF. Patient reported she can ask her granddaughter for assistance with ADLs as her preference is to discharge home with HHPT rather than go to SNF. CSW made referral to Amy with [facility] and is awaiting response regarding availability. TOC to follow.  Expected Discharge Plan: [place] Barriers to Discharge: Continued Medical Work up  Patient Goals and CMS Choice Patient states their goals for this hospitalization and ongoing recovery are:: Discharge home with [facility] CMS Medicare.gov Compare Post Acute Care list provided to:: Patient Choice offered to / list presented to : Patient  Expected Discharge Plan and Services Expected Discharge Plan: [place] In-house Referral: Clinical Social Work Post Acute Care Choice: [place] arrangements for the past 2 months: [place]              DME Arranged: N/A DME Agency: NA  Prior Living Arrangements/Services Living arrangements for the past 2 months: Single Family Home Lives with:: Spouse Patient language and need for interpreter reviewed::  Yes Do you feel safe going back to the place where you live?: Yes      Need for Family Participation in Patient Care: Yes (Comment) Care giver support system in place?: Yes (comment) Current home services: DME (Rolling walker, cane, rollator) Criminal Activity/Legal Involvement Pertinent to Current Situation/Hospitalization: No - Comment as needed  Activities of Daily Living Home Assistive Devices/Equipment: Cane (specify quad or straight),Other (Comment),Eyeglasses (straight cane, stair climber. rollator) ADL Screening (condition at time of admission) Patient's cognitive ability adequate to safely complete daily activities?: Yes Is the patient deaf or have difficulty hearing?: No Does the patient have difficulty seeing, even when wearing glasses/contacts?: No Does the patient have difficulty concentrating, remembering, or making decisions?: No Patient able to express need for assistance with ADLs?: Yes Does the patient have difficulty dressing or bathing?: No Independently performs ADLs?: Yes (appropriate for developmental age) Does the patient have difficulty walking or climbing stairs?: Yes Weakness of Legs: Both Weakness of Arms/Hands: None  Permission Sought/Granted Permission sought to share information with : Other (comment) Permission granted to share information with : Yes, Verbal Permission Granted Permission granted to share info w AGENCY: [place] agencies  Emotional Assessment Appearance:: Appears stated age Attitude/Demeanor/Rapport: Engaged Affect (typically observed): Appropriate Orientation: : Oriented to Self,Oriented to Place,Oriented to  Time,Oriented to Situation Alcohol / Substance Use: Not Applicable Psych Involvement: No (comment)  Admission diagnosis:  Biliary obstruction [K83.1] Cancer of unknown origin ([place]) [C80.1] Acute kidney injury ([place]) [N17.9] Malignant neoplasm metastatic to both lungs ([place]) [C78.01, C78.02] Malignant neoplasm of pancreas, unspecified  location of malignancy ([place]) [C25.9] Acute liver failure without hepatic coma [K72.00] Patient Active  Problem List   Diagnosis Date Noted  . Obstructive jaundice due to malignant neoplasm ([place]) 02/28/2021  . AKI (acute kidney injury) ([place]) 03/06/2021  . Hyperkalemia 02/23/2021  . DM type 2 (diabetes mellitus, type 2) ([place]) 02/17/2021  . Coagulopathy ([place]) 02/10/2021  . Aortic atherosclerosis ([place]) 02/17/2021  . Hypercalcemia 02/12/2021   PCP:  Reita Cliche, MD Pharmacy:   [id number] [place] [address]Phone: [phone]: [phone] Readmission Risk Interventions Readmission Risk Prevention Plan 03/12/2021  Transportation Screening Complete  Home Care Screening Complete  Medication Review (RN CM) Complete  Some recent data might be hidden

## 2021-03-13 ENCOUNTER — Inpatient Hospital Stay (HOSPITAL_COMMUNITY): Payer: Medicare Other

## 2021-03-13 DIAGNOSIS — D689 Coagulation defect, unspecified: Secondary | ICD-10-CM | POA: Diagnosis not present

## 2021-03-13 DIAGNOSIS — K831 Obstruction of bile duct: Secondary | ICD-10-CM | POA: Diagnosis not present

## 2021-03-13 DIAGNOSIS — E1165 Type 2 diabetes mellitus with hyperglycemia: Secondary | ICD-10-CM | POA: Diagnosis not present

## 2021-03-13 DIAGNOSIS — I471 Supraventricular tachycardia: Secondary | ICD-10-CM | POA: Diagnosis not present

## 2021-03-13 DIAGNOSIS — N179 Acute kidney failure, unspecified: Secondary | ICD-10-CM | POA: Diagnosis not present

## 2021-03-13 LAB — COMPREHENSIVE METABOLIC PANEL
ALT: 279 U/L — ABNORMAL HIGH (ref 0–44)
AST: 321 U/L — ABNORMAL HIGH (ref 15–41)
Albumin: 2 g/dL — ABNORMAL LOW (ref 3.5–5.0)
Alkaline Phosphatase: 1500 U/L — ABNORMAL HIGH (ref 38–126)
Anion gap: 9 (ref 5–15)
BUN: 44 mg/dL — ABNORMAL HIGH (ref 8–23)
CO2: 16 mmol/L — ABNORMAL LOW (ref 22–32)
Calcium: 8.6 mg/dL — ABNORMAL LOW (ref 8.9–10.3)
Chloride: 104 mmol/L (ref 98–111)
Creatinine, Ser: 1.43 mg/dL — ABNORMAL HIGH (ref 0.44–1.00)
GFR, Estimated: 39 mL/min — ABNORMAL LOW (ref >60)
Glucose, Bld: 221 mg/dL — ABNORMAL HIGH (ref 70–99)
Potassium: 4.9 mmol/L (ref 3.5–5.1)
Sodium: 129 mmol/L — ABNORMAL LOW (ref 135–145)
Total Bilirubin: 15.7 mg/dL — ABNORMAL HIGH (ref 0.3–1.2)
Total Protein: 6.1 g/dL — ABNORMAL LOW (ref 6.5–8.1)

## 2021-03-13 LAB — MAGNESIUM: Magnesium: 1.9 mg/dL (ref 1.7–2.4)

## 2021-03-13 LAB — PHOSPHORUS: Phosphorus: 3.6 mg/dL (ref 2.5–4.6)

## 2021-03-13 LAB — GLUCOSE, CAPILLARY
Glucose-Capillary: 208 mg/dL — ABNORMAL HIGH (ref 70–99)
Glucose-Capillary: 251 mg/dL — ABNORMAL HIGH (ref 70–99)
Glucose-Capillary: 203 mg/dL — ABNORMAL HIGH (ref 70–99)
Glucose-Capillary: 198 mg/dL — ABNORMAL HIGH (ref 70–99)

## 2021-03-13 LAB — POTASSIUM: Potassium: 5.1 mmol/L (ref 3.5–5.1)

## 2021-03-13 LAB — PROTIME-INR
INR: 1.2 (ref 0.8–1.2)
Prothrombin Time: 15 seconds (ref 11.4–15.2)

## 2021-03-13 MED ORDER — INSULIN GLARGINE 100 UNIT/ML ~~LOC~~ SOLN
12.0000 [IU] | Freq: Every day | SUBCUTANEOUS | Status: DC
  Administered 2021-03-14 – 2021-03-21 (×7): 12 [IU] via SUBCUTANEOUS
  Filled 2021-03-13 (×8): qty 0.12

## 2021-03-13 MED ORDER — DILTIAZEM HCL 60 MG PO TABS
90.0000 mg | ORAL_TABLET | Freq: Four times a day (QID) | ORAL | Status: DC
  Administered 2021-03-13 – 2021-03-19 (×14): 90 mg via ORAL
  Filled 2021-03-13 (×19): qty 1

## 2021-03-13 NOTE — TOC Progression Note (Signed)
Transition of Care [facility]) - Progression Note    Patient Details  Name: DUSTYN ARMBRISTER MRN: [id number] Date of Birth: 04-07-1951  Transition of Care [facility]) CM/SW Contact  Tomara Youngberg, Juliann Pulse, RN Phone Number: 03/13/2021, 10:50 AM  Clinical Narrative:   [facility] for HHPT. awating for d/c.    Expected Discharge Plan: [place] Barriers to Discharge: Continued Medical Work up  Expected Discharge Plan and Services Expected Discharge Plan: [place] In-house Referral: Clinical Social Work   Post Acute Care Choice: [place] arrangements for the past 2 months: Single Family Home                 DME Arranged: N/A DME Agency: NA                   Social Determinants of Health (SDOH) Interventions    Readmission Risk Interventions Readmission Risk Prevention Plan 03/12/2021  Transportation Screening Complete  Home Care Screening Complete  Medication Review (RN CM) Complete  Some recent data might be hidden

## 2021-03-13 NOTE — Progress Notes (Addendum)
Physical Therapy Treatment Patient Details Name: Laura Sherman MRN: [id number] DOB: October 02, 1951 Today's Date: 03/13/2021    History of Present Illness Pt admitted from home 2* weakness and falls and dx with AKI, hyperkalemia, hypercalcemia, hyponatremia and with CT findings "concerning for pancreatic malignancy, multiple lung nodules and multiple ill-defined areas in liver concerning for metastatic disease".  Pt with hx of DM back surgery and breast CA    PT Comments    Pt assisted with transfer to recliner.  Pt with increased work of breathing however SPO2 92% on room air during session.  Pt agreeable to remain in recliner for lunch.      Follow Up Recommendations  SNF (would benefit from SNF however pt prefers home at this time per [facility] notes)     Equipment Recommendations  Wheelchair Patient suffers from metastatic cancer which impairs their ability to perform daily activities like ambulating in the home.  A walker alone will not resolve the issues with performing activities of daily living. A wheelchair will allow patient to safely perform daily activities.  The patient can self propel in the home or has a caregiver who can provide assistance.      Recommendations for Other Services       Precautions / Restrictions Precautions Precautions: Fall    Mobility  Bed Mobility Overal bed mobility: Needs Assistance Bed Mobility: Supine to Sit     Supine to sit: Min assist     General bed mobility comments: pt utilized bed rail and therapist's hand to self assist to EOB    Transfers Overall transfer level: Needs assistance Equipment used: Rolling walker (2 wheeled) Transfers: Sit to/from [facility] Sit to Stand: Mod assist;+2 safety/equipment;From elevated surface Stand pivot transfers: Min assist;+2 safety/equipment       General transfer comment: verbal cues for hand placement and full upright posture, pt declined using RW and instead requested 2 HHA  (daughter present and reports pt typically in some trunk flexion at baseline with RW at home); SpO2 92% on room air however pt with increased work of breathing during mobility  Ambulation/Gait                 Stairs             Wheelchair Mobility    Modified Rankin (Stroke Patients Only)       Balance                                            Cognition Arousal/Alertness: Awake/alert Behavior During Therapy: Flat affect Overall Cognitive Status: Within Functional Limits for tasks assessed                                        Exercises      General Comments        Pertinent Vitals/Pain Pain Assessment: No/denies pain    Home Living                      Prior Function            PT Goals (current goals can now be found in the care plan section) Progress towards PT goals: Progressing toward goals    Frequency    Min 3X/week  PT Plan Current plan remains appropriate    Co-evaluation              AM-PAC PT "6 Clicks" Mobility   Outcome Measure  Help needed turning from your back to your side while in a flat bed without using bedrails?: A Little Help needed moving from lying on your back to sitting on the side of a flat bed without using bedrails?: A Lot Help needed moving to and from a bed to a chair (including a wheelchair)?: A Lot Help needed standing up from a chair using your arms (e.g., wheelchair or bedside chair)?: A Lot Help needed to walk in hospital room?: A Lot Help needed climbing 3-5 steps with a railing? : Total 6 Click Score: 12    End of Session Equipment Utilized During Treatment: Gait belt Activity Tolerance: Patient limited by fatigue Patient left: in chair;with call bell/phone within reach;with chair alarm set;with family/visitor present Nurse Communication: Mobility status PT Visit Diagnosis: Difficulty in walking, not elsewhere classified (R26.2);Muscle  weakness (generalized) (M62.81);Unsteadiness on feet (R26.81)     Time: 1540-0867 PT Time Calculation (min) (ACUTE ONLY): 24 min  Charges:  $Therapeutic Activity: 8-22 mins                    Jannette Spanner PT, DPT Acute Rehabilitation Services Pager: [phone] Office: [phone]  York Ram E 03/13/2021, 1:23 PM

## 2021-03-13 NOTE — Plan of Care (Signed)
Pt shows no s/s of acute distress; resting comfortably in bed with hob >30; belongings at bedside with call light within reach.

## 2021-03-13 NOTE — Progress Notes (Addendum)
[facility] Gastroenterology Progress Note  Laura Sherman 69 y.o. 03/03/1951  CC:  Pancreatic mass, obstructive jaundice  Subjective: Patient denies any abdominal pain, nausea, or vomiting. Is tolerating a soft diet. Denies having a BM yesterday.  ROS : Review of Systems  Cardiovascular: Negative for chest pain and palpitations.  Gastrointestinal: Negative for abdominal pain, blood in stool, constipation, diarrhea, heartburn, melena, nausea and vomiting.    Objective: Vital signs in last 24 hours: Vitals:   03/12/21 2200 03/13/21 0618  BP: 119/64 113/72  Pulse: 91 72  Resp: (!) 25 (!) 24  Temp:  97.8 F (36.6 C)  SpO2: 94% 94%    Physical Exam:  General:  Lethargic, oriented, cooperative, no distress, jaundiced  Head:  Normocephalic, without obvious abnormality, atraumatic  Eyes:  Deep icterus, EOMs intact  Lungs:   Mildly tachypneic  Heart:  Regular rate and rhythm, S1, S2 normal  Abdomen:   Soft, non-tender, non-distended, bowel sounds active all four quadrants  Extremities: Extremities normal, atraumatic, no  edema  Pulses: 2+ and symmetric    Lab Results: Recent Labs    03/11/21 0505 03/12/21 0502 03/12/21 1023 03/13/21 0157 03/13/21 0540  NA 137 131*  --  129*  --   K 5.2* 6.0*   < > 4.9 5.1  CL 112* 108  --  104  --   CO2 17* 13*  --  16*  --   GLUCOSE 178* 222*  --  221*  --   BUN 41* 43*  --  44*  --   CREATININE 1.09* 1.09*  --  1.43*  --   CALCIUM 8.8* 8.4*  --  8.6*  --   MG 2.1  --   --  1.9  --   PHOS  --   --   --  3.6  --    < > = values in this interval not displayed.   Recent Labs    03/12/21 0502 03/13/21 0157  AST 353* 321*  ALT 293* 279*  ALKPHOS 1,615* 1,500*  BILITOT 14.2* 15.7*  PROT 5.2* 6.1*  ALBUMIN 1.7* 2.0*   No results for input(s): WBC, NEUTROABS, HGB, HCT, MCV, PLT in the last 72 hours. Recent Labs    03/12/21 1023 03/13/21 0157  LABPROT 21.8* 15.0  INR 1.9* 1.2    Assessment: Obstructive jaundice: pancreatic  mass with pulmonary and liver metastases. Biliary and pancreatic duct obstruction in the pancreatic head region. Imaging features likely reflect pancreatic adenocarcinoma with metastatic disease -T. Bili 15.7/ AST 321/ ALT 279/ ALP 1500  Coagulopathy, likely related to liver metastases: INR 1.2 after Vitamin K  AKI: Cr 1.43 today  History of breast cancer and uterine adenocarcinoma  Plan: Plan for EUS (Dr. Paulita Fujita) and ERCP with stent placement (Dr. Therisa Doyne) tomorrow 5/4.  Soft diet OK.  NPO at midnight.  Continue to trend LFTs.  Continue to trend INR.  [facility] will follow.  Salley Slaughter PA-C 03/13/2021, 8:50 AM  Contact #  [phone]

## 2021-03-13 NOTE — Progress Notes (Signed)
Progress Note  Patient Name: Laura Sherman Date of Encounter: 03/13/2021  [facility] Cardiologist: Fransico Him, MD   Subjective   No acute overnight events. She continues to have runs of PAT, longest one being about 3 minutes last night with rates in the 170's. However, patient asymptomatic with this. No chest pain, shortness of breath, or palpitations.  Inpatient Medications    Scheduled Meds: . albuterol  2.5 mg Nebulization BID  . budesonide  0.25 mg Nebulization BID  . diltiazem  60 mg Oral Q6H  . fluticasone  1 spray Each Nare BID  . guaiFENesin  600 mg Oral BID  . insulin aspart  0-15 Units Subcutaneous TID WC  . insulin aspart  0-5 Units Subcutaneous QHS  . insulin glargine  10 Units Subcutaneous Daily  . metoprolol tartrate  100 mg Oral BID  . pantoprazole  40 mg Oral Daily  . sodium chloride flush  3 mL Intravenous Q12H   Continuous Infusions: . sodium chloride 100 mL/hr at 03/12/21 2236   PRN Meds: albuterol, guaiFENesin-dextromethorphan, ondansetron **OR** ondansetron (ZOFRAN) IV   Vital Signs    Vitals:   03/12/21 2100 03/12/21 2140 03/12/21 2200 03/13/21 0618  BP: 122/66 120/66 119/64 113/72  Pulse: 89  91 72  Resp: (!) 31  (!) 25 (!) 24  Temp:  97.8 F (36.6 C)  97.8 F (36.6 C)  TempSrc:  Oral  Oral  SpO2: 92%  94% 94%  Weight:      Height:        Intake/Output Summary (Last 24 hours) at 03/13/2021 0721 Last data filed at 03/13/2021 6063 Gross per 24 hour  Intake 920 ml  Output 1825 ml  Net -905 ml   Last 3 Weights 03/09/2021  Weight (lbs) 216 lb  Weight (kg) 97.977 kg      Telemetry    Normal sinus rhythm with baseline rates in the 70's to 90's. Continues to have episodes of PAT. Longest episode was about 3 minutes last night with rates in the 170's. - Personally Reviewed  ECG    No new ECG tracing today. - Personally Reviewed  Physical Exam   GEN: Ill appearing and severely jaundice. No acute distress. Neck: Unable to assess  JVD due to body habitus.  Cardiac: RRR. No murmurs, rubs, or gallops.  Respiratory: Tachypneic with mild increased work of breathing. Mild crackles in bases. GI: Soft, slightly distended, and non-tender. MS: Trace lower extremity edema (left chronically larger than right). No deformity. Skin: Warm and dry. Neuro:  No focal deficits. Psych: Normal affect. Responds appropriately.  Labs    High Sensitivity Troponin:   Recent Labs  Lab 02/14/2021 1113 02/09/2021 1420 03/11/21 0505 03/11/21 0804  TROPONINIHS 30* 29* 23* 27*      Chemistry Recent Labs  Lab 03/11/21 0505 03/12/21 0502 03/12/21 1023 03/12/21 2149 03/13/21 0157 03/13/21 0540  NA 137 131*  --   --  129*  --   K 5.2* 6.0*   < > 4.9 4.9 5.1  CL 112* 108  --   --  104  --   CO2 17* 13*  --   --  16*  --   GLUCOSE 178* 222*  --   --  221*  --   BUN 41* 43*  --   --  44*  --   CREATININE 1.09* 1.09*  --   --  1.43*  --   CALCIUM 8.8* 8.4*  --   --  8.6*  --  PROT 6.0* 5.2*  --   --  6.1*  --   ALBUMIN 2.1* 1.7*  --   --  2.0*  --   AST 436* 353*  --   --  321*  --   ALT 338* 293*  --   --  279*  --   ALKPHOS 1,710* 1,615*  --   --  1,500*  --   BILITOT 13.3* 14.2*  --   --  15.7*  --   GFRNONAA 54* 54*  --   --  39*  --   ANIONGAP 8 10  --   --  9  --    < > = values in this interval not displayed.     Hematology Recent Labs  Lab 04/07/2021 1113 03/09/21 0317  WBC 10.4 9.4  RBC 3.61* 3.35*  HGB 10.4* 9.8*  HCT 31.3* 29.4*  MCV 86.7 87.8  MCH 28.8 29.3  MCHC 33.2 33.3  RDW 16.4* 16.7*  PLT 282 249    BNPNo results for input(s): BNP, PROBNP in the last 168 hours.   DDimer  Recent Labs  Lab 03/10/21 2211  DDIMER 5.68*     Radiology    ECHOCARDIOGRAM COMPLETE  Result Date: 03/11/2021    ECHOCARDIOGRAM REPORT   Patient Name:   Laura Sherman Date of Exam: 03/11/2021 Medical Rec #:  [id number]        Height:       63.0 in Accession #:    [id number]       Weight:       216.0 lb Date of Birth:   [birth year]        BSA:          1.998 m Patient Age:    70 years         BP:           134/83 mmHg Patient Gender: F                HR:           92 bpm. Exam Location:  Inpatient Procedure: 2D Echo, Cardiac Doppler, Color Doppler and Intracardiac            Opacification Agent Indications:    I47.1 SVT  History:        Patient has no prior history of Echocardiogram examinations.                 Risk Factors:Hypertension and Diabetes. GERD. Cancer.  Sonographer:    Jonelle Sidle Dance Referring Phys: [facility]  1. Left ventricular ejection fraction, by estimation, is 70 to 75%. The left ventricle has hyperdynamic function. The left ventricle has no regional wall motion abnormalities. There is mild left ventricular hypertrophy. Left ventricular diastolic parameters are consistent with Grade I diastolic dysfunction (impaired relaxation).  2. Right ventricular systolic function is normal. The right ventricular size is normal.  3. Left atrial size was mildly dilated.  4. The mitral valve is normal in structure. No evidence of mitral valve regurgitation. No evidence of mitral stenosis. Moderate mitral annular calcification.  5. The aortic valve is normal in structure. Aortic valve regurgitation is not visualized. No aortic stenosis is present.  6. The inferior vena cava is normal in size with greater than 50% respiratory variability, suggesting right atrial pressure of 3 mmHg. FINDINGS  Left Ventricle: Left ventricular ejection fraction, by estimation, is 70 to 75%. The left ventricle has hyperdynamic function. The left ventricle has  no regional wall motion abnormalities. Definity contrast agent was given IV to delineate the left ventricular endocardial borders. The left ventricular internal cavity size was normal in size. There is mild left ventricular hypertrophy. Left ventricular diastolic parameters are consistent with Grade I diastolic dysfunction (impaired relaxation). Right Ventricle: The right  ventricular size is normal. No increase in right ventricular wall thickness. Right ventricular systolic function is normal. Left Atrium: Left atrial size was mildly dilated. Right Atrium: Right atrial size was normal in size. Pericardium: There is no evidence of pericardial effusion. Mitral Valve: The mitral valve is normal in structure. Moderate mitral annular calcification. No evidence of mitral valve regurgitation. No evidence of mitral valve stenosis. Tricuspid Valve: The tricuspid valve is normal in structure. Tricuspid valve regurgitation is not demonstrated. No evidence of tricuspid stenosis. Aortic Valve: The aortic valve is normal in structure. Aortic valve regurgitation is not visualized. No aortic stenosis is present. Pulmonic Valve: The pulmonic valve was normal in structure. Pulmonic valve regurgitation is not visualized. No evidence of pulmonic stenosis. Aorta: The aortic root is normal in size and structure. Venous: The inferior vena cava is normal in size with greater than 50% respiratory variability, suggesting right atrial pressure of 3 mmHg. IAS/Shunts: No atrial level shunt detected by color flow Doppler.  LEFT VENTRICLE PLAX 2D LVIDd:         4.60 cm  Diastology LVIDs:         2.60 cm  LV e' medial:    7.62 cm/s LV PW:         1.20 cm  LV E/e' medial:  11.1 LV IVS:        1.10 cm  LV e' lateral:   8.05 cm/s LVOT diam:     2.00 cm  LV E/e' lateral: 10.5 LV SV:         61 LV SV Index:   31 LVOT Area:     3.14 cm  RIGHT VENTRICLE             IVC RV Basal diam:  3.50 cm     IVC diam: 1.90 cm RV Mid diam:    2.80 cm RV S prime:     20.10 cm/s TAPSE (M-mode): 2.0 cm LEFT ATRIUM             Index       RIGHT ATRIUM           Index LA diam:        3.60 cm 1.80 cm/m  RA Area:     11.70 cm LA Vol (A2C):   82.2 ml 41.14 ml/m RA Volume:   24.70 ml  12.36 ml/m LA Vol (A4C):   49.1 ml 24.58 ml/m LA Biplane Vol: 69.0 ml 34.54 ml/m  AORTIC VALVE LVOT Vmax:   103.00 cm/s LVOT Vmean:  77.000 cm/s LVOT VTI:     0.194 m  AORTA Ao Root diam: 3.00 cm Ao Asc diam:  3.80 cm MITRAL VALVE MV Area (PHT): 2.87 cm     SHUNTS MV Decel Time: 264 msec     Systemic VTI:  0.19 m MV E velocity: 84.60 cm/s   Systemic Diam: 2.00 cm MV A velocity: 117.00 cm/s MV E/A ratio:  0.72 Donato SchultzMark Skains MD Electronically signed by Donato SchultzMark Skains MD Signature Date/Time: 03/11/2021/12:06:10 PM    Final     Cardiac Studies   Echocardiogram 03/11/2021: Impressions: 1. Left ventricular ejection fraction, by estimation, is 70 to 75%. The  left ventricle  has hyperdynamic function. The left ventricle has no  regional wall motion abnormalities. There is mild left ventricular  hypertrophy. Left ventricular diastolic  parameters are consistent with Grade I diastolic dysfunction (impaired  relaxation).  2. Right ventricular systolic function is normal. The right ventricular  size is normal.  3. Left atrial size was mildly dilated.  4. The mitral valve is normal in structure. No evidence of mitral valve  regurgitation. No evidence of mitral stenosis. Moderate mitral annular  calcification.  5. The aortic valve is normal in structure. Aortic valve regurgitation is  not visualized. No aortic stenosis is present.  6. The inferior vena cava is normal in size with greater than 50%  respiratory variability, suggesting right atrial pressure of 3 mmHg.    Patient Profile     70 y.o. female with a history of hypertension, diabetes mellitus, GERD, breast cancer in 2005 s/p chemo and radiation, and uterine cancer in 2010 s/p total hysterectomy who is being seen today for the evaluation of SVT at the request of Dr. Sarajane Jews.  Assessment & Plan    Paroxysmal Atrial Tachycardia - Patient presented with weakness and jaundice and found to have suspected pancreatic cancer with metastasis to liver and lungs. Cardiology consulted for paroxysmal SVT which was felt to be most consistent with PAT. - Continue to have runs of PAT, longest being 3 minutes.  Asymptomatic with this. - Electrolytes OK. - TSH normal. - Echo showed LVEF of 70-75% with normal wall motion and grade 1 diastolic dysfunction. - Continue Lopressor 100mg  twice daily.  - Will increase Diltiazem to 90mg  every 6 hours. - Continue to monitor on telemetry.  Possible Mild Volume Overload - Patient tachypnea this morning but denies any shortness of breath. - She does have mild crackles on exam. - May benefit from a dose of IV Lasix. Will discuss with MD.  Hypertension - BP well controlled. - Continue Lopressor and Diltiazem as above.  AKI - Creatinine 2.38 on admission. Secondary to poor oral intake. Improved with IV fluids.  - Creatinine improved to 1.43 today but up from 1.09 the past 2 days. - Continue to avoid nephrotoxic agents.  - Continue to monitor closely.  - Management per primary team.  Hyperkalemia - Potassium 6.5 on admission. Has received 2 doses of Lokelma so far this admission. Last dose was on 5/2. - Potassium 4.9 today. - Management per primary team.  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

## 2021-03-13 NOTE — Progress Notes (Signed)
PROGRESS NOTE  Laura Sherman YQM:[id number] DOB: May 12, 1951 DOA: 02/09/2021 PCP: Reita Cliche, MD  Brief History   70 year old woman PMH breast cancer, uterine cancer, diabetes mellitus type 2, hypertension presented to the emergency department with 1 week history of failure to thrive, anorexia, fatigue.  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  Seen by gastroenterology with plans for ERCP and EUS, delayed by coagulopathy.  Plan for procedures 5/4.  SNF recommended but patient prefers to go home.  She will follow-up with her oncologist in [facility] on discharge.  A & P  Obstructive jaundice, elevated LFTs, abnormal CT and MRI concerning for pancreatic malignancy. -- LFTs stable.  Jaundice is significant.  Plan for ERCP and EUS 5/4.   Pancreatic mass, multiple lung nodules, multiple hepatic lesions consistent with metastatic disease.  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 -- Plan ERCP EUS as above --PMH of endometrial carcinoma and breast cancer noted --Patient wants to follow-up with her oncologist in [facility] Dr. Dustin Folks, I discussed by telephone with him 5/2.  He will arrange for close outpatient follow-up.  Okay to use right arm for blood draws and IV.   Coagulopathy, most likely secondary to metastatic liver disease -- Coagulopathy corrected with vitamin K. -- Patient and family understand risk of VTE given malignancy.   Acute kidney injury secondary to poor oral intake, prerenal azotemia.  No cardiac history. -- Creatinine up today after 1 dose of Lasix yesterday for hyperkalemia.  Continue IV fluids today.  Recheck in a.m.  SVT --TSH within normal limits.  2D echocardiogram unremarkable.  --Electrolytes stable.  Continues to have intermittent SVT despite metoprolol and addition of diltiazem.  Appreciate cardiology management.  Diltiazem increased today.  Diabetes mellitus type 2 with  hyperglycemia hemoglobin A1c 8.0 -- Continue to hold oral medications.  Still hyperglycemic.  Will increase Lantus further.  Continue sliding scale insulin.   Hyponatremia secondary to poor oral intake, corrected 131 -- resolved.   Aortic atherosclerosis -- No treatment indicated  RESOLVED Hyperkalemia secondary to acute kidney injury -- Resolved with treatment and IV fluids.  Hypercalcemia, concerning given suspected malignancy -- Resolved with hydration.  Trivial troponin elevation -- Troponins flat, no further evaluation suggested.  Unclear why checked, no signs or symptoms to suggest ACS.  EKG nonacute.   PMH . Endometrial carcinoma status post robotic assisted hysterectomy and bilateral salpingo-oophorectomy in 2014. Pathology well-differentiated endometrial carcinoma with negative nodes . Breast cancer, 2006 ductal adenocarcinoma status right lumpectomy, radiation and chemo post mastectomy ER positive HER-2/neu negative completed antiestrogen therapy   Disposition Plan:  Discussion: as above  Status is: Inpatient  Remains inpatient appropriate because:Ongoing diagnostic testing needed not appropriate for outpatient work up, IV treatments appropriate due to intensity of illness or inability to take PO and Inpatient level of care appropriate due to severity of illness   Dispo: The patient is from: Home              Anticipated d/c is to: Home              Patient currently is not medically stable to d/c.   Difficult to place patient No  DVT prophylaxis: SCDs Start: 03/07/2021 1912   Code Status: DNR Level of care: Telemetry Family Communication: husband at bedside  Murray Hodgkins, MD  [facility] Direct contact: see [email or web address] (further directions at bottom of note if needed) 7PM-7AM contact night coverage as at bottom of note 03/13/2021, 2:55  PM  LOS: 5 days   Significant Hospital Events   . 4/28 admit for obstructive jaundice   Consults:  . GI . Cardiology     Procedures:  .   Significant Diagnostic Tests:  . CT chest, abd, pelvis w/ pancreatic mass, hepatic and lung lesions concerning for metastatic disease . MRI w/ multiple liver lesions c/w metastatic disease, associated with biliary and pancreatic duct obstruction, lesion in the head of the pancreas, suspected pancreatic adenocarcinoma, multiple pulmonary nodules . Echo LVEF 17-51%, grade 1 diastolic dysfunction   Micro Data:  . None    Antimicrobials:  . None   Interval History/Subjective  CC: f/u jaundice  Feels ok today, breathing ok  Objective   Vitals:  Vitals:   03/13/21 1016 03/13/21 1256  BP: 129/64 (!) 121/95  Pulse: 80 69  Resp:  (!) 35  Temp:  (!) 97.5 F (36.4 C)  SpO2:  92%    Exam:  Constitutional:   . Appears calm and comfortable ENMT:  . grossly normal hearing  Respiratory:  . CTA bilaterally, no w/r/r.  . Respiratory effort normal.  Cardiovascular:  . RRR, no m/r/g . No sig LE extremity edema   Psychiatric:  . Mental status o Mood, affect appropriate  I have personally reviewed the following:   Today's Data  . UOP 1825 . CBG 200s . K+ 5.1 . Creatinine up slightly after Lasix yesterday 1.43 . INR 1.2  Scheduled Meds: . albuterol  2.5 mg Nebulization BID  . budesonide  0.25 mg Nebulization BID  . diltiazem  90 mg Oral Q6H  . fluticasone  1 spray Each Nare BID  . guaiFENesin  600 mg Oral BID  . insulin aspart  0-15 Units Subcutaneous TID WC  . insulin aspart  0-5 Units Subcutaneous QHS  . [START ON 03/13/2021] insulin glargine  12 Units Subcutaneous Daily  . metoprolol tartrate  100 mg Oral BID  . pantoprazole  40 mg Oral Daily  . sodium chloride flush  3 mL Intravenous Q12H   Continuous Infusions: . sodium chloride 150 mL/hr at 03/13/21 0848    Principal Problem:   Obstructive jaundice due to malignant neoplasm (HCC) Active Problems:   AKI (acute kidney injury) ([place])   Hyperkalemia   DM type 2 (diabetes mellitus, type 2)  ([place])   Coagulopathy ([place])   Aortic atherosclerosis ([place])   Hypercalcemia   LOS: 5 days   How to contact the [facility] Attending or Consulting provider [place] or covering provider during after hours [place], for this patient?  1. Check the care team in [facility] and look for a) attending/consulting TRH provider listed and b) the [facility] team listed 2. Log into [email or web address] and use [place]'s universal password to access. If you do not have the password, please contact the hospital operator. 3. Locate the [facility] provider you are looking for under [facility] and page to a number that you can be directly reached. 4. If you still have difficulty reaching the provider, please page the [facility] (Director on Call) for the Hospitalists listed on amion for assistance.

## 2021-03-14 ENCOUNTER — Inpatient Hospital Stay (HOSPITAL_COMMUNITY): Payer: Medicare Other

## 2021-03-14 ENCOUNTER — Inpatient Hospital Stay (HOSPITAL_COMMUNITY): Payer: Medicare Other | Admitting: Anesthesiology

## 2021-03-14 ENCOUNTER — Encounter (HOSPITAL_COMMUNITY): Payer: Self-pay | Admitting: Internal Medicine

## 2021-03-14 ENCOUNTER — Encounter (HOSPITAL_COMMUNITY): Admission: EM | Disposition: E | Payer: Self-pay | Source: Home / Self Care | Attending: Internal Medicine

## 2021-03-14 DIAGNOSIS — K831 Obstruction of bile duct: Secondary | ICD-10-CM | POA: Diagnosis not present

## 2021-03-14 DIAGNOSIS — C801 Malignant (primary) neoplasm, unspecified: Secondary | ICD-10-CM | POA: Diagnosis not present

## 2021-03-14 HISTORY — PX: ENDOSCOPIC RETROGRADE CHOLANGIOPANCREATOGRAPHY (ERCP) WITH PROPOFOL: SHX5810

## 2021-03-14 HISTORY — PX: SPHINCTEROTOMY: SHX5544

## 2021-03-14 HISTORY — PX: ESOPHAGOGASTRODUODENOSCOPY (EGD) WITH PROPOFOL: SHX5813

## 2021-03-14 HISTORY — PX: FINE NEEDLE ASPIRATION: SHX6590

## 2021-03-14 HISTORY — PX: EUS: SHX5427

## 2021-03-14 LAB — COMPREHENSIVE METABOLIC PANEL
ALT: 243 U/L — ABNORMAL HIGH (ref 0–44)
AST: 268 U/L — ABNORMAL HIGH (ref 15–41)
Albumin: 1.8 g/dL — ABNORMAL LOW (ref 3.5–5.0)
Alkaline Phosphatase: 1633 U/L — ABNORMAL HIGH (ref 38–126)
Anion gap: 7 (ref 5–15)
BUN: 48 mg/dL — ABNORMAL HIGH (ref 8–23)
CO2: 14 mmol/L — ABNORMAL LOW (ref 22–32)
Calcium: 8.5 mg/dL — ABNORMAL LOW (ref 8.9–10.3)
Chloride: 110 mmol/L (ref 98–111)
Creatinine, Ser: 1.52 mg/dL — ABNORMAL HIGH (ref 0.44–1.00)
GFR, Estimated: 36 mL/min — ABNORMAL LOW (ref >60)
Glucose, Bld: 203 mg/dL — ABNORMAL HIGH (ref 70–99)
Potassium: 5.1 mmol/L (ref 3.5–5.1)
Sodium: 131 mmol/L — ABNORMAL LOW (ref 135–145)
Total Bilirubin: 16.7 mg/dL — ABNORMAL HIGH (ref 0.3–1.2)
Total Protein: 5.8 g/dL — ABNORMAL LOW (ref 6.5–8.1)

## 2021-03-14 LAB — GLUCOSE, CAPILLARY
Glucose-Capillary: 204 mg/dL — ABNORMAL HIGH (ref 70–99)
Glucose-Capillary: 185 mg/dL — ABNORMAL HIGH (ref 70–99)
Glucose-Capillary: 217 mg/dL — ABNORMAL HIGH (ref 70–99)
Glucose-Capillary: 206 mg/dL — ABNORMAL HIGH (ref 70–99)

## 2021-03-14 LAB — POCT I-STAT 7, (LYTES, BLD GAS, ICA,H+H)
Acid-base deficit: 13 mmol/L — ABNORMAL HIGH (ref 0.0–2.0)
Bicarbonate: 15.8 mmol/L — ABNORMAL LOW (ref 20.0–28.0)
Calcium, Ion: 1.27 mmol/L (ref 1.15–1.40)
HCT: 29 % — ABNORMAL LOW (ref 36.0–46.0)
Hemoglobin: 9.9 g/dL — ABNORMAL LOW (ref 12.0–15.0)
O2 Saturation: 95 %
Potassium: 5.5 mmol/L — ABNORMAL HIGH (ref 3.5–5.1)
Sodium: 136 mmol/L (ref 135–145)
TCO2: 17 mmol/L — ABNORMAL LOW (ref 22–32)
pCO2 arterial: 48.1 mmHg — ABNORMAL HIGH (ref 32.0–48.0)
pH, Arterial: 7.126 — CL (ref 7.350–7.450)
pO2, Arterial: 100 mmHg (ref 83.0–108.0)

## 2021-03-14 LAB — BLOOD GAS, VENOUS
Acid-base deficit: 14.1 mmol/L — ABNORMAL HIGH (ref 0.0–2.0)
Bicarbonate: 12.8 mmol/L — ABNORMAL LOW (ref 20.0–28.0)
O2 Saturation: 98.3 %
Patient temperature: 98.6
pCO2, Ven: 35.1 mmHg — ABNORMAL LOW (ref 44.0–60.0)
pH, Ven: 7.186 — CL (ref 7.250–7.430)
pO2, Ven: 122 mmHg — ABNORMAL HIGH (ref 32.0–45.0)

## 2021-03-14 LAB — PROTIME-INR
INR: 1.2 (ref 0.8–1.2)
Prothrombin Time: 14.7 seconds (ref 11.4–15.2)

## 2021-03-14 SURGERY — ULTRASOUND, UPPER GI TRACT, ENDOSCOPIC
Anesthesia: General

## 2021-03-14 MED ORDER — STERILE WATER FOR INJECTION IV SOLN
INTRAVENOUS | Status: DC
  Filled 2021-03-14: qty 1000
  Filled 2021-03-14: qty 150

## 2021-03-14 MED ORDER — EPHEDRINE SULFATE-NACL 50-0.9 MG/10ML-% IV SOSY
PREFILLED_SYRINGE | INTRAVENOUS | Status: DC | PRN
  Administered 2021-03-14 (×2): 15 mg via INTRAVENOUS
  Administered 2021-03-14 (×2): 10 mg via INTRAVENOUS

## 2021-03-14 MED ORDER — SODIUM BICARBONATE 8.4 % IV SOLN
150.0000 meq | Freq: Once | INTRAVENOUS | Status: AC
  Administered 2021-03-14: 150 meq via INTRAVENOUS
  Filled 2021-03-14: qty 150
  Filled 2021-03-14: qty 50

## 2021-03-14 MED ORDER — PHENYLEPHRINE HCL-NACL 10-0.9 MG/250ML-% IV SOLN
INTRAVENOUS | Status: DC | PRN
  Administered 2021-03-14: 40 ug/min via INTRAVENOUS

## 2021-03-14 MED ORDER — CIPROFLOXACIN IN D5W 400 MG/200ML IV SOLN
INTRAVENOUS | Status: DC | PRN
  Administered 2021-03-14: 400 mg via INTRAVENOUS

## 2021-03-14 MED ORDER — VITAMIN B COMPLEX PO TABS: ORAL_TABLET | Freq: Every day | ORAL | Status: DC

## 2021-03-14 MED ORDER — PROPOFOL 10 MG/ML IV BOLUS
INTRAVENOUS | Status: AC
  Filled 2021-03-14: qty 20

## 2021-03-14 MED ORDER — ALBUTEROL SULFATE (2.5 MG/3ML) 0.083% IN NEBU: 3.0000 mL | INHALATION_SOLUTION | Freq: Four times a day (QID) | RESPIRATORY_TRACT | Status: DC | PRN

## 2021-03-14 MED ORDER — VITAMIN D (ERGOCALCIFEROL) 1.25 MG (50000 UNIT) PO CAPS: 50000.0000 [IU] | ORAL_CAPSULE | ORAL | Status: DC

## 2021-03-14 MED ORDER — PSEUDOEPHEDRINE-GUAIFENESIN ER 120-1200 MG PO TB12: 1.0000 | ORAL_TABLET | Freq: Two times a day (BID) | ORAL | Status: DC

## 2021-03-14 MED ORDER — FENTANYL CITRATE (PF) 100 MCG/2ML IJ SOLN
INTRAMUSCULAR | Status: AC
  Filled 2021-03-14: qty 2

## 2021-03-14 MED ORDER — SODIUM CHLORIDE 0.9 % IV SOLN: INTRAVENOUS | Status: DC

## 2021-03-14 MED ORDER — ONDANSETRON HCL 4 MG/2ML IJ SOLN
INTRAMUSCULAR | Status: DC | PRN
  Administered 2021-03-14: 4 mg via INTRAVENOUS

## 2021-03-14 MED ORDER — SODIUM CHLORIDE 0.9 % IV SOLN
INTRAVENOUS | Status: DC | PRN
  Administered 2021-03-14: 10 mL

## 2021-03-14 MED ORDER — ACETAMINOPHEN 325 MG PO TABS: 650.0000 mg | ORAL_TABLET | Freq: Four times a day (QID) | ORAL | Status: DC | PRN

## 2021-03-14 MED ORDER — ROCURONIUM BROMIDE 10 MG/ML (PF) SYRINGE
PREFILLED_SYRINGE | INTRAVENOUS | Status: DC | PRN
  Administered 2021-03-14: 45 mg via INTRAVENOUS

## 2021-03-14 MED ORDER — OXYMETAZOLINE HCL 0.05 % NA SOLN: 1.0000 | Freq: Every day | NASAL | Status: AC | PRN

## 2021-03-14 MED ORDER — CIPROFLOXACIN IN D5W 400 MG/200ML IV SOLN
INTRAVENOUS | Status: AC
  Filled 2021-03-14: qty 200

## 2021-03-14 MED ORDER — GLIPIZIDE 2.5 MG HALF TABLET: 2.5000 mg | ORAL_TABLET | Freq: Every day | ORAL | Status: DC

## 2021-03-14 MED ORDER — ADULT MULTIVITAMIN W/MINERALS CH
1.0000 | ORAL_TABLET | Freq: Every day | ORAL | Status: DC
  Administered 2021-03-16 – 2021-03-19 (×4): 1 via ORAL
  Filled 2021-03-14 (×4): qty 1

## 2021-03-14 MED ORDER — DEXAMETHASONE SODIUM PHOSPHATE 10 MG/ML IJ SOLN
INTRAMUSCULAR | Status: DC | PRN
  Administered 2021-03-14: 4 mg via INTRAVENOUS

## 2021-03-14 MED ORDER — INDOMETHACIN 50 MG RE SUPP
RECTAL | Status: AC
  Filled 2021-03-14: qty 2

## 2021-03-14 MED ORDER — LORATADINE 10 MG PO TABS
10.0000 mg | ORAL_TABLET | Freq: Every day | ORAL | Status: DC
  Administered 2021-03-16 – 2021-03-19 (×4): 10 mg via ORAL
  Filled 2021-03-14 (×4): qty 1

## 2021-03-14 MED ORDER — GLUCAGON HCL RDNA (DIAGNOSTIC) 1 MG IJ SOLR
INTRAMUSCULAR | Status: DC | PRN
  Administered 2021-03-14 (×4): .5 mg via INTRAVENOUS

## 2021-03-14 MED ORDER — PSEUDOEPHEDRINE HCL ER 120 MG PO TB12
120.0000 mg | ORAL_TABLET | Freq: Two times a day (BID) | ORAL | Status: DC | PRN
  Filled 2021-03-14: qty 1

## 2021-03-14 MED ORDER — IRBESARTAN 75 MG PO TABS
37.5000 mg | ORAL_TABLET | Freq: Every day | ORAL | Status: DC
  Filled 2021-03-14: qty 0.5

## 2021-03-14 MED ORDER — VASOPRESSIN 20 UNIT/ML IV SOLN
INTRAVENOUS | Status: DC | PRN
  Administered 2021-03-14 (×2): 1 [IU] via INTRAVENOUS

## 2021-03-14 MED ORDER — LIDOCAINE 2% (20 MG/ML) 5 ML SYRINGE
INTRAMUSCULAR | Status: DC | PRN
  Administered 2021-03-14: 60 mg via INTRAVENOUS

## 2021-03-14 MED ORDER — FLUTICASONE PROPIONATE 50 MCG/ACT NA SUSP: 1.0000 | Freq: Every day | NASAL | Status: DC

## 2021-03-14 MED ORDER — PROPOFOL 1000 MG/100ML IV EMUL
INTRAVENOUS | Status: AC
  Filled 2021-03-14: qty 100

## 2021-03-14 MED ORDER — VITAMIN E 45 MG (100 UNIT) PO CAPS
100.0000 [IU] | ORAL_CAPSULE | Freq: Every day | ORAL | Status: DC
  Administered 2021-03-16 – 2021-03-19 (×4): 100 [IU] via ORAL
  Filled 2021-03-14 (×8): qty 1

## 2021-03-14 MED ORDER — PHENYLEPHRINE 40 MCG/ML (10ML) SYRINGE FOR IV PUSH (FOR BLOOD PRESSURE SUPPORT)
PREFILLED_SYRINGE | INTRAVENOUS | Status: DC | PRN
  Administered 2021-03-14 (×2): 80 ug via INTRAVENOUS
  Administered 2021-03-14: 120 ug via INTRAVENOUS

## 2021-03-14 MED ORDER — PROPOFOL 10 MG/ML IV BOLUS
INTRAVENOUS | Status: DC | PRN
  Administered 2021-03-14: 100 mg via INTRAVENOUS

## 2021-03-14 MED ORDER — HYDROCHLOROTHIAZIDE 25 MG PO TABS: 25.0000 mg | ORAL_TABLET | Freq: Every day | ORAL | Status: DC

## 2021-03-14 MED ORDER — GLUCAGON HCL RDNA (DIAGNOSTIC) 1 MG IJ SOLR
INTRAMUSCULAR | Status: AC
  Filled 2021-03-14: qty 1

## 2021-03-14 MED ORDER — SUGAMMADEX SODIUM 200 MG/2ML IV SOLN
INTRAVENOUS | Status: DC | PRN
  Administered 2021-03-14: 200 mg via INTRAVENOUS

## 2021-03-14 MED ORDER — INDOMETHACIN 50 MG RE SUPP
RECTAL | Status: DC | PRN
  Administered 2021-03-14: 100 mg via RECTAL

## 2021-03-14 MED ORDER — VASOPRESSIN 20 UNIT/ML IV SOLN
INTRAVENOUS | Status: AC
  Filled 2021-03-14: qty 1

## 2021-03-14 MED ORDER — MONTELUKAST SODIUM 10 MG PO TABS
10.0000 mg | ORAL_TABLET | Freq: Every day | ORAL | Status: DC
  Administered 2021-03-16 – 2021-03-19 (×4): 10 mg via ORAL
  Filled 2021-03-14 (×4): qty 1

## 2021-03-14 MED ORDER — ALBUMIN HUMAN 5 % IV SOLN
INTRAVENOUS | Status: AC
  Filled 2021-03-14: qty 500

## 2021-03-14 MED ORDER — CALCIUM CARBONATE 1250 (500 CA) MG PO TABS
500.0000 mg | ORAL_TABLET | Freq: Every day | ORAL | Status: DC
  Administered 2021-03-16 – 2021-03-19 (×4): 500 mg via ORAL
  Filled 2021-03-14 (×4): qty 1

## 2021-03-14 MED ORDER — B COMPLEX-C PO TABS
1.0000 | ORAL_TABLET | Freq: Every day | ORAL | Status: DC
  Administered 2021-03-16 – 2021-03-19 (×4): 1 via ORAL
  Filled 2021-03-14 (×8): qty 1

## 2021-03-14 MED ORDER — PROPOFOL 500 MG/50ML IV EMUL
INTRAVENOUS | Status: AC
  Filled 2021-03-14: qty 50

## 2021-03-14 MED ORDER — ALBUMIN HUMAN 5 % IV SOLN: INTRAVENOUS | Status: DC | PRN

## 2021-03-14 NOTE — Op Note (Signed)
[facility] Patient Name: Laura Sherman Procedure Date: 03/28/2021 MRN: [id number] Attending MD: Arta Silence , MD Date of Birth: 1951-04-12 CSN: [id number] Age: 70 Admit Type: Inpatient Procedure:                Upper EUS Indications:              Common bile duct dilation (acquired) seen on MRCP,                            Suspected mass in pancreas on MRCP Providers:                Arta Silence, MD, Baird Cancer, RN, Tyrone Apple, Technician, Lerry Paterson, CRNA Referring MD:             [facility] Medicines:                General Anesthesia Complications:            No immediate complications. Estimated Blood Loss:     Estimated blood loss: none. Estimated blood loss                            was minimal. Procedure:                Pre-Anesthesia Assessment:                           - Prior to the procedure, a History and Physical                            was performed, and patient medications and                            allergies were reviewed. The patient's tolerance of                            previous anesthesia was also reviewed. The risks                            and benefits of the procedure and the sedation                            options and risks were discussed with the patient.                            All questions were answered, and informed consent                            was obtained. Prior Anticoagulants: The patient has                            taken no previous anticoagulant or antiplatelet  agents. ASA Grade Assessment: IV - A patient with                            severe systemic disease that is a constant threat                            to life. After reviewing the risks and benefits,                            the patient was deemed in satisfactory condition to                            undergo the procedure.                           After obtaining  informed consent, the endoscope was                            passed under direct vision. Throughout the                            procedure, the patient's blood pressure, pulse, and                            oxygen saturations were monitored continuously. The                            [id number] ([id number]) Olympus Linear EUS was                            introduced through the mouth, and advanced to the                            second part of duodenum. The upper EUS was                            accomplished without difficulty. The patient                            tolerated the procedure well. Scope In: Scope Out: Findings:      ENDOSONOGRAPHIC FINDING: :      No lymphadenopathy seen.      There was no sign of significant endosonographic abnormality in the       ampulla.      An irregular mass was identified in the pancreatic head. The mass was       hypoechoic. The mass measured 32 mm by 21 mm in maximal cross-sectional       diameter. The endosonographic borders were poorly-defined. There was       evidence of tumor ingrowth into the distal intrapancreatic extrahepatic       bile duct. Bile duct and cystic duct dilated upstream of this lesion.       There was sonographic evidence suggesting invasion into the portal vein       (manifested by encasement), the superior mesenteric vein (manifested by  encasement) and the splenic vein (manifested by abutment). The remainder       of the pancreas was examined. The endosonographic appearance of       parenchyma and the upstream pancreatic duct indicated duct dilation.       Fine needle aspiration for cytology was performed. Color Doppler imaging       was utilized prior to needle puncture to confirm a lack of significant       vascular structures within the needle path. Five passes were made with       the 25 gauge needle using a transduodenal approach. A stylet was used. A       cytotechnologist was present to evaluate the  adequacy of the specimen.       The cellularity of the specimen was adequate. Final cytology results are       pending.      Multiple round lesions were identified endosonographically in the left       lobe of the liver and in the caudate lobe of the liver. The       endosonographic appearance was suggestive of metastases. The lesions       were hypoechoic. The largest lesion measured 15 mm by 15 mm in maximal       cross-sectional diameter. The endosonographic borders were well-defined.       Fine needle aspiration for cytology was performed. Color Doppler imaging       was utilized prior to needle puncture to confirm a lack of significant       vascular structures within the needle path. Two passes were made with       the 25 gauge needle using a transgastric approach. A stylet was used. A       cytotechnologist was present to evaluate the adequacy of the specimen.       Final cytology results are pending. Impression:               - There was no sign of significant pathology in the                            ampulla.                           - A mass was identified in the pancreatic head                            versus cholangiocarcinoma extending from bile duct                            into pancreatic parenchyma. This was staged T3 N0                            Mx by endosonographic criteria, but widely                            metastatic disease suggested on recent MRCP. Fine                            needle aspiration performed.                           -  Suspected liver lesions in caudate/left lobe of                            liver, FNA biopsy x 2 performed. Moderate Sedation:      None Recommendation:           - Await cytology findings.                           - Perform an ERCP today. Procedure Code(s):        --- Professional ---                           [phone], Esophagogastroduodenoscopy, flexible,                            transoral; with transendoscopic  ultrasound-guided                            intramural or transmural fine needle                            aspiration/biopsy(s) (includes endoscopic                            ultrasound examination of the esophagus, stomach,                            and either the duodenum or a surgically altered                            stomach where the jejunum is examined distal to the                            anastomosis) Diagnosis Code(s):        --- Professional ---                           K86.89, Other specified diseases of pancreas                           K83.8, Other specified diseases of biliary tract                           R93.3, Abnormal findings on diagnostic imaging of                            other parts of digestive tract CPT copyright 2019 American Medical Association. All rights reserved. The codes documented in this report are preliminary and upon coder review may  be revised to meet current compliance requirements. Arta Silence, MD 04/09/21 1:23:55 PM This report has been signed electronically. Number of Addenda: 0

## 2021-03-14 NOTE — Progress Notes (Signed)
PROGRESS NOTE    Laura Sherman  IRS:[id number] DOB: [birth year] DOA: 02/17/2021 PCP: Reita Cliche, MD   Brief Narrative:  70 year old woman PMH breast cancer, uterine cancer, diabetes mellitus type 2, hypertension presented to the emergency department with 1 week history of failure to thrive, anorexia, fatigue. 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 Seen by gastroenterology with plans for ERCP and EUS, delayed by coagulopathy. SNF recommended per PT but patient prefers to go home.  She is to follow-up with her oncologist in [facility] on discharge.  Assessment & Plan:   Principal Problem:   Obstructive jaundice due to malignant neoplasm (HCC) Active Problems:   AKI (acute kidney injury) ([place])   Hyperkalemia   DM type 2 (diabetes mellitus, type 2) (HCC)   Coagulopathy (HCC)   Aortic atherosclerosis (HCC)   Hypercalcemia   Obstructive jaundice, elevated LFTs, abnormal CT and MRI concerning for pancreatic malignancy. - LFTs stable.  Jaundice is significant.   - Plan for ERCP and EUS later today per GI  Pancreatic mass, multiple lung nodules, multiple hepatic lesions consistent with metastatic disease.  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 - Plan ERCP EUS as above - PMH of endometrial carcinoma and breast cancer noted - Patient wants to follow-up with her oncologist in [facility] Dr. Dustin Folks -contacted by previous physician on 03/12/21 to arrange close outpatient follow-up. Okay to use right arm for blood draws and IV.  Coagulopathy, most likely secondary to metastatic liver disease - Coagulopathy corrected with vitamin K. - Patient and family understand risk of VTE given malignancy.  Acute kidney injury secondary to poor oral intake/prerenal azotemia. No cardiac history. - Creatinine up today after 1 dose of Lasix yesterday for hyperkalemia.  Continue IV fluids today.  Recheck in  a.m.  SVT -TSH within normal limits.  2D echocardiogram unremarkable.  --Electrolytes stable.  Continues to have intermittent SVT despite metoprolol and addition of diltiazem.  Appreciate cardiology management.  Diltiazem increased today.  Diabetes mellitus type 2 with hyperglycemia hemoglobin A1c 8.0 - Continue to hold oral medications.  Still hyperglycemic.  Will increase Lantus further.  Continue sliding scale insulin.  Hyponatremia secondary to poor oral intake, corrected 131 - resolved.   Aortic atherosclerosis -No treatment indicated  RESOLVED: Hyperkalemia secondary to acute kidney injury -- Resolved with treatment and IV fluids.  Hypercalcemia, concerning given suspected malignancy -- Resolved with hydration.  Trivial troponin elevation --Troponins flat, no further evaluation suggested. Unclear why checked, no signs or symptoms to suggest ACS. EKG nonacute.   PMH  Endometrial carcinoma status post robotic assisted hysterectomy and bilateral salpingo-oophorectomy in 2014. 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, radiation and chemo post mastectomy ER positive HER-2/neu negative completed antiestrogen therapy  DVT prophylaxis: SCDs only perioperatively Code Status: Full Family Communication: None  Status is: Inpatient  Dispo: The patient is from: Home              Anticipated d/c is to: Home              Anticipated d/c date is: >72 hours pending clinical course              Patient currently not medically stable for discharge  Consultants:   GI  Cardiology  Procedures/Imaging:   CT chest, abd, pelvis w/ pancreatic mass, hepatic and lung lesions concerning for metastatic disease  MRI w/ multiple liver lesions c/w metastatic  disease, associated with biliary and pancreatic duct obstruction, lesion in the head of the pancreas, suspected pancreatic  adenocarcinoma, multiple pulmonary nodules  Echo LVEF 16-10%, grade 1 diastolic dysfunction  Antimicrobials:  None  Subjective: No acute issues or events overnight denies nausea vomiting diarrhea constipation headache fevers or chills.  Lengthy discussion this morning at bedside about CODE STATUS, patient previously noted to be DNR via living will at home, discussion with lawyer and family but currently requesting change to full CODE STATUS in the interim "in case something happens" -we discussed that chest compressions and intubation would only occur if her heart had already stopped or she was having difficulty or unable to breathe on her own and would likely not affect her prognosis for her other comorbid conditions as above.  Objective: Vitals:   03/13/21 1256 03/13/21 2028 03/13/21 2249 03/13/21 2251  BP: (!) 121/95  131/71   Pulse: 69   80  Resp: (!) 35  (!) 37 (!) 25  Temp: (!) 97.5 F (36.4 C)     TempSrc: Oral     SpO2: 92% 94%  92%  Weight:      Height:        Intake/Output Summary (Last 24 hours) at 04/06/2021 0709 Last data filed at 03/13/2021 1019 Gross per 24 hour  Intake 243 ml  Output --  Net 243 ml   Filed Weights   03/09/2021 1046  Weight: 98 kg    Examination:  General:  Pleasantly resting in bed, No acute distress. HEENT:  Normocephalic atraumatic.  Sclerae moderately icteric, noninjected.  Extraocular movements intact bilaterally. Neck:  Without mass or deformity.  Trachea is midline. Lungs:  Clear to auscultate bilaterally without rhonchi, wheeze, or rales. Heart:  Regular rate and rhythm.  Without murmurs, rubs, or gallops. Abdomen:  Soft, nontender, minimally distended.  Without guarding or rebound. Extremities: Without cyanosis, clubbing, edema, or obvious deformity. Vascular:  Dorsalis pedis and posterior tibial pulses palpable bilaterally. Skin:  Warm and dry, moderately jaundiced   Data Reviewed: I have personally reviewed following labs and  imaging studies  CBC: Recent Labs  Lab 02/13/2021 1113 03/09/21 0317  WBC 10.4 9.4  NEUTROABS 8.8*  --   HGB 10.4* 9.8*  HCT 31.3* 29.4*  MCV 86.7 87.8  PLT 282 960   Basic Metabolic Panel: Recent Labs  Lab 03/09/21 0926 03/10/21 0424 03/11/21 0505 03/12/21 0502 03/12/21 1023 03/12/21 1815 03/12/21 2149 03/13/21 0157 03/13/21 0540 04/06/2021 0438  NA  --  132* 137 131*  --   --   --  129*  --  131*  K  --  4.9 5.2* 6.0*   < > 4.9 4.9 4.9 5.1 5.1  CL  --  105 112* 108  --   --   --  104  --  110  CO2  --  18* 17* 13*  --   --   --  16*  --  14*  GLUCOSE  --  192* 178* 222*  --   --   --  221*  --  203*  BUN  --  48* 41* 43*  --   --   --  44*  --  48*  CREATININE  --  1.24* 1.09* 1.09*  --   --   --  1.43*  --  1.52*  CALCIUM  --  8.7* 8.8* 8.4*  --   --   --  8.6*  --  8.5*  MG 1.8 2.3 2.1  --   --   --   --  1.9  --   --   PHOS 2.7 2.8  --   --   --   --   --  3.6  --   --    < > = values in this interval not displayed.   GFR: Estimated Creatinine Clearance: 37.8 mL/min (A) (by C-G formula based on SCr of 1.52 mg/dL (H)). Liver Function Tests: Recent Labs  Lab 03/10/21 0424 03/11/21 0505 03/12/21 0502 03/13/21 0157 04/05/2021 0438  AST 410* 436* 353* 321* 268*  ALT 336* 338* 293* 279* 243*  ALKPHOS 1,622* 1,710* 1,615* 1,500* 1,633*  BILITOT 13.6* 13.3* 14.2* 15.7* 16.7*  PROT 6.2* 6.0* 5.2* 6.1* 5.8*  ALBUMIN 2.2* 2.1* 1.7* 2.0* 1.8*   Recent Labs  Lab 02/10/2021 1113  LIPASE 182*   No results for input(s): AMMONIA in the last 168 hours. Coagulation Profile: Recent Labs  Lab 03/10/21 0943 03/11/21 0505 03/12/21 1023 03/13/21 0157 04/10/2021 0438  INR 3.6* 3.2* 1.9* 1.2 1.2   Cardiac Enzymes: No results for input(s): CKTOTAL, CKMB, CKMBINDEX, TROPONINI in the last 168 hours. BNP (last 3 results) No results for input(s): PROBNP in the last 8760 hours. HbA1C: No results for input(s): HGBA1C in the last 72 hours. CBG: Recent Labs  Lab 03/12/21 2140  03/13/21 0718 03/13/21 1136 03/13/21 1617 03/13/21 2057  GLUCAP 273* 208* 251* 203* 198*   Lipid Profile: No results for input(s): CHOL, HDL, LDLCALC, TRIG, CHOLHDL, LDLDIRECT in the last 72 hours. Thyroid Function Tests: No results for input(s): TSH, T4TOTAL, FREET4, T3FREE, THYROIDAB in the last 72 hours. Anemia Panel: No results for input(s): VITAMINB12, FOLATE, FERRITIN, TIBC, IRON, RETICCTPCT in the last 72 hours. Sepsis Labs: Recent Labs  Lab 03/01/2021 1113 03/01/2021 1807  LATICACIDVEN 0.8 1.0    Recent Results (from the past 240 hour(s))  Resp Panel by RT-PCR (Flu A&B, Covid) Nasopharyngeal Swab     Status: None   Collection Time: 03/01/2021 11:40 AM   Specimen: Nasopharyngeal Swab; Nasopharyngeal(NP) swabs in vial transport medium  Result Value Ref Range Status   SARS Coronavirus 2 by RT PCR NEGATIVE NEGATIVE Final    Comment: (NOTE) SARS-CoV-2 target nucleic acids are NOT DETECTED.  The SARS-CoV-2 RNA is generally detectable in upper respiratory specimens during the acute phase of infection. The lowest concentration of SARS-CoV-2 viral copies this assay can detect is 138 copies/mL. A negative result does not preclude SARS-Cov-2 infection and should not be used as the sole basis for treatment or other patient management decisions. A negative result may occur with  improper specimen collection/handling, submission of specimen other than nasopharyngeal swab, presence of viral mutation(s) within the areas targeted by this assay, and inadequate number of viral copies(<138 copies/mL). A negative result must be combined with clinical observations, patient history, and epidemiological information. The expected result is Negative.  Fact Sheet for Patients:  EntrepreneurPulse.com.au  Fact Sheet for Healthcare Providers:  IncredibleEmployment.be  This test is no t yet approved or cleared by the [place] FDA and  has been authorized  for detection and/or diagnosis of SARS-CoV-2 by FDA under an Emergency Use Authorization (EUA). This EUA will remain  in effect (meaning this test can be used) for the duration of the COVID-19 declaration under Section 564(b)(1) of the Act, 21 U.S.C.section 360bbb-3(b)(1), unless the authorization is terminated  or revoked sooner.       Influenza A by PCR NEGATIVE NEGATIVE Final   Influenza B by PCR NEGATIVE NEGATIVE Final    Comment: (NOTE) The Xpert Xpress  SARS-CoV-2/FLU/RSV plus assay is intended as an aid in the diagnosis of influenza from Nasopharyngeal swab specimens and should not be used as a sole basis for treatment. Nasal washings and aspirates are unacceptable for Xpert Xpress SARS-CoV-2/FLU/RSV testing.  Fact Sheet for Patients: EntrepreneurPulse.com.au  Fact Sheet for Healthcare Providers: IncredibleEmployment.be  This test is not yet approved or cleared by the [place] FDA and has been authorized for detection and/or diagnosis of SARS-CoV-2 by FDA under an Emergency Use Authorization (EUA). This EUA will remain in effect (meaning this test can be used) for the duration of the COVID-19 declaration under Section 564(b)(1) of the Act, 21 U.S.C. section 360bbb-3(b)(1), unless the authorization is terminated or revoked.  Performed at [facility], [address], [place], [place] [address]          Radiology Studies: DG CHEST PORT 1 VIEW  Result Date: 03/13/2021 CLINICAL DATA:  Pancreatic carcinoma EXAM: PORTABLE CHEST 1 VIEW COMPARISON:  Chest CT March 08, 2021 FINDINGS: Multiple nodular opacities throughout the lungs consistent with metastatic disease, similar to recent CT examination. No edema or airspace opacity. Heart is upper normal in size with pulmonary vascularity normal. Adenopathy in the mediastinum better appreciated by CT but suggested by radiography. No bone lesions. IMPRESSION: Findings  indicative of metastatic disease, similar to recent CT. No edema or airspace opacity. Heart upper normal in size. Electronically Signed   By: Lowella Grip III M.D.   On: 03/13/2021 11:41        Scheduled Meds: . albuterol  2.5 mg Nebulization BID  . budesonide  0.25 mg Nebulization BID  . diltiazem  90 mg Oral Q6H  . fluticasone  1 spray Each Nare BID  . guaiFENesin  600 mg Oral BID  . insulin aspart  0-15 Units Subcutaneous TID WC  . insulin aspart  0-5 Units Subcutaneous QHS  . insulin glargine  12 Units Subcutaneous Daily  . metoprolol tartrate  100 mg Oral BID  . pantoprazole  40 mg Oral Daily  . sodium chloride flush  3 mL Intravenous Q12H   Continuous Infusions: . sodium chloride 150 mL/hr at 03/26/2021 0118     LOS: 6 days   Time spent: 75mn  Kayla Deshaies C Alicyn Klann, DO [facility]  If 7PM-7AM, please contact night-coverage [email or web address]  04/02/2021, 7:09 AM

## 2021-03-14 NOTE — Transfer of Care (Signed)
Immediate Anesthesia Transfer of Care Note  Patient: ASCENCION STEGNER  Procedure(s) Performed: FULL UPPER ENDOSCOPIC ULTRASOUND (EUS) RADIAL (N/A ) FINE NEEDLE ASPIRATION SPHINCTEROTOMY ENDOSCOPIC RETROGRADE CHOLANGIOPANCREATOGRAPHY (ERCP) WITH PROPOFOL (N/A )  Patient Location: PACU  Anesthesia Type:General  Level of Consciousness: drowsy  Airway & Oxygen Therapy: Patient Spontanous Breathing and Patient connected to face mask oxygen  Post-op Assessment: Report given to RN and Post -op Vital signs reviewed and stable  Post vital signs: Reviewed and stable  Last Vitals:  Vitals Value Taken Time  BP    Temp    Pulse    Resp    SpO2      Last Pain:  Vitals:   03/19/2021 1123  TempSrc: Oral  PainSc: 0-No pain      Patients Stated Pain Goal: 0 (33/83/29 1916)  Complications: No complications documented.

## 2021-03-14 NOTE — Progress Notes (Signed)
Bipap mask removed per NP/Morrison's request.  Pt placed back on 2lnc, HR72, rr23, spo2 94%.  RN aware.  Machine remains in room on standby if needed.

## 2021-03-14 NOTE — Anesthesia Postprocedure Evaluation (Signed)
Anesthesia Post Note  Patient: Laura Sherman  Procedure(s) Performed: FULL UPPER ENDOSCOPIC ULTRASOUND (EUS) RADIAL (N/A ) FINE NEEDLE ASPIRATION SPHINCTEROTOMY ENDOSCOPIC RETROGRADE CHOLANGIOPANCREATOGRAPHY (ERCP) WITH PROPOFOL (N/A )     Patient location during evaluation: Endoscopy Anesthesia Type: General Level of consciousness: awake and alert Pain management: pain level controlled Vital Signs Assessment: post-procedure vital signs reviewed and stable Respiratory status: spontaneous breathing, nonlabored ventilation, respiratory function stable and patient connected to nasal cannula oxygen Cardiovascular status: blood pressure returned to baseline and stable Postop Assessment: no apparent nausea or vomiting Anesthetic complications: no Comments: Upon arrival in endoscopy recovery, pt difficult to arouse with tachypnea and increased work of breathing. ABG collected, and patient placed on BiPAP. ABG 7.12/48.1/100/15.8. After ~30 min, pt became more alert, and BiPAP removed. Other VSS. Pt brought to progressive unit without complication.    No complications documented.  Last Vitals:  Vitals:   03/16/2021 1600 04/02/2021 1610  BP: (!) 109/52 115/63  Pulse: 67 69  Resp: (!) 24 (!) 26  Temp:    SpO2: 96% 93%    Last Pain:  Vitals:   03/13/2021 1610  TempSrc:   PainSc: 0-No pain                 Damein Gaunce L Ariell Gunnels

## 2021-03-14 NOTE — Progress Notes (Signed)
Progress Note  Patient Name: Laura Sherman Date of Encounter: 04/13/21  [facility] Cardiologist: Fransico Him, MD   Subjective   No chest pain, dyspnea or palpitations. SVT improving. No event since last night.   Inpatient Medications    Scheduled Meds: . albuterol  2.5 mg Nebulization BID  . budesonide  0.25 mg Nebulization BID  . diltiazem  90 mg Oral Q6H  . fluticasone  1 spray Each Nare BID  . guaiFENesin  600 mg Oral BID  . insulin aspart  0-15 Units Subcutaneous TID WC  . insulin aspart  0-5 Units Subcutaneous QHS  . insulin glargine  12 Units Subcutaneous Daily  . metoprolol tartrate  100 mg Oral BID  . pantoprazole  40 mg Oral Daily  . sodium chloride flush  3 mL Intravenous Q12H   Continuous Infusions: . sodium chloride 150 mL/hr at 04/13/2021 0118   PRN Meds: albuterol, guaiFENesin-dextromethorphan, ondansetron **OR** ondansetron (ZOFRAN) IV   Vital Signs    Vitals:   03/13/21 2249 03/13/21 2251 04-13-21 0800 2021-04-13 0926  BP: 131/71   120/75  Pulse:  80  75  Resp: (!) 37 (!) 25    Temp:      TempSrc:      SpO2:  92% 90%   Weight:      Height:        Intake/Output Summary (Last 24 hours) at Apr 13, 2021 1104 Last data filed at 04-13-21 0929 Gross per 24 hour  Intake 3 ml  Output 650 ml  Net -647 ml   Last 3 Weights 02/27/2021  Weight (lbs) 216 lb  Weight (kg) 97.977 kg      Telemetry    SR HR in 60-70s - Personally Reviewed  ECG    N/A  Physical Exam   GEN: Ill appearing and severely jaundice female in no acute distress.   Neck:  JVD difficult to assess due to grith  Cardiac: RRR, no murmurs, rubs, or gallops.  Respiratory: basilar crackles GI: Soft, nontender, non-distended  MS: No edema; No deformity. Neuro:  Nonfocal  Psych: Normal affect   Labs    High Sensitivity Troponin:   Recent Labs  Lab 03/05/2021 1113 02/28/2021 1420 03/11/21 0505 03/11/21 0804  TROPONINIHS 30* 29* 23* 27*      Chemistry Recent Labs  Lab  03/12/21 0502 03/12/21 1023 03/13/21 0157 03/13/21 0540 04-13-21 0438  NA 131*  --  129*  --  131*  K 6.0*   < > 4.9 5.1 5.1  CL 108  --  104  --  110  CO2 13*  --  16*  --  14*  GLUCOSE 222*  --  221*  --  203*  BUN 43*  --  44*  --  48*  CREATININE 1.09*  --  1.43*  --  1.52*  CALCIUM 8.4*  --  8.6*  --  8.5*  PROT 5.2*  --  6.1*  --  5.8*  ALBUMIN 1.7*  --  2.0*  --  1.8*  AST 353*  --  321*  --  268*  ALT 293*  --  279*  --  243*  ALKPHOS 1,615*  --  1,500*  --  1,633*  BILITOT 14.2*  --  15.7*  --  16.7*  GFRNONAA 54*  --  39*  --  36*  ANIONGAP 10  --  9  --  7   < > = values in this interval not displayed.  Hematology Recent Labs  Lab 03/01/2021 1113 03/09/21 0317  WBC 10.4 9.4  RBC 3.61* 3.35*  HGB 10.4* 9.8*  HCT 31.3* 29.4*  MCV 86.7 87.8  MCH 28.8 29.3  MCHC 33.2 33.3  RDW 16.4* 16.7*  PLT 282 249    BNPNo results for input(s): BNP, PROBNP in the last 168 hours.   DDimer  Recent Labs  Lab 03/10/21 2211  DDIMER 5.68*     Radiology    DG CHEST PORT 1 VIEW  Result Date: 03/13/2021 CLINICAL DATA:  Pancreatic carcinoma EXAM: PORTABLE CHEST 1 VIEW COMPARISON:  Chest CT March 08, 2021 FINDINGS: Multiple nodular opacities throughout the lungs consistent with metastatic disease, similar to recent CT examination. No edema or airspace opacity. Heart is upper normal in size with pulmonary vascularity normal. Adenopathy in the mediastinum better appreciated by CT but suggested by radiography. No bone lesions. IMPRESSION: Findings indicative of metastatic disease, similar to recent CT. No edema or airspace opacity. Heart upper normal in size. Electronically Signed   By: Lowella Grip III M.D.   On: 03/13/2021 11:41   Cardiac Studies   Echocardiogram 03/11/2021: Impressions: 1. Left ventricular ejection fraction, by estimation, is 70 to 75%. The  left ventricle has hyperdynamic function. The left ventricle has no  regional wall motion abnormalities. There  is mild left ventricular  hypertrophy. Left ventricular diastolic  parameters are consistent with Grade I diastolic dysfunction (impaired  relaxation).  2. Right ventricular systolic function is normal. The right ventricular  size is normal.  3. Left atrial size was mildly dilated.  4. The mitral valve is normal in structure. No evidence of mitral valve  regurgitation. No evidence of mitral stenosis. Moderate mitral annular  calcification.  5. The aortic valve is normal in structure. Aortic valve regurgitation is  not visualized. No aortic stenosis is present.  6. The inferior vena cava is normal in size with greater than 50%  respiratory variability, suggesting right atrial pressure of 3 mmHg.    Patient Profile     70 y.o. female with a historyof hypertension, diabetes mellitus, GERD, breast cancer in 2005 s/p chemo and radiation, and uterine cancer in 2010 s/p total hysterectomywho is being seen today for the evaluation ofSVTat the request ofDr. Sarajane Jews.  Patient presented with weakness and jaundice and found to have suspected pancreatic cancer with metastasis to liver and lungs. Cardiology consulted for paroxysmal SVT which was felt to be most consistent with PAT.  Assessment & Plan    1. PAT/SVT - HR and ectopy improved after increasing Cardizem yesterday - Electrolytes stable - TSH normal - Echo showed LVEF of 70-75% with normal wall motion and grade 1 diastolic dysfunction. -Continue Lopressor 100mg  twice daily and Diltiazem to 90mg  every 6 hours (likely consolidate to long acting next 24-48 hours)  2. AKI - Creatinine 2.38 on admission. Secondary to poor oral intake. Improved with IV fluids up to 1.09 on 5/2 then 1.43>>1.52. On fluids. Got lasix 40mg  x 1 on 5/3.  - CXR without edema yesterday  - Management per primary team   Otherwise per primary team   For questions or updates, please contact [place] Please consult [email or web address] for contact info  under        SignedLeanor Kail, PA  04/06/2021, 11:04 AM

## 2021-03-14 NOTE — Anesthesia Preprocedure Evaluation (Addendum)
Anesthesia Evaluation  Patient identified by MRN, date of birth, ID band Patient awake    Reviewed: Allergy & Precautions, H&P , NPO status , Patient's Chart, lab work & pertinent test results, reviewed documented beta blocker date and time   Airway Mallampati: III  TM Distance: >3 FB Neck ROM: Full    Dental no notable dental hx. (+) Teeth Intact, Dental Advisory Given   Pulmonary asthma ,    Pulmonary exam normal breath sounds clear to auscultation       Cardiovascular hypertension, Pt. on medications and Pt. on home beta blockers  Rhythm:Regular Rate:Normal     Neuro/Psych negative neurological ROS  negative psych ROS   GI/Hepatic Neg liver ROS, GERD  Medicated,  Endo/Other  diabetes, Type 2, Oral Hypoglycemic AgentsMorbid obesity  Renal/GU Renal InsufficiencyRenal disease  negative genitourinary   Musculoskeletal  (+) Arthritis , Osteoarthritis,    Abdominal   Peds  Hematology negative hematology ROS (+)   Anesthesia Other Findings   Reproductive/Obstetrics negative OB ROS                            Anesthesia Physical Anesthesia Plan  ASA: III  Anesthesia Plan: General   Post-op Pain Management:    Induction: Intravenous  PONV Risk Score and Plan: 3 and Ondansetron and Dexamethasone  Airway Management Planned: Oral ETT  Additional Equipment:   Intra-op Plan:   Post-operative Plan: Extubation in OR  Informed Consent: I have reviewed the patients History and Physical, chart, labs and discussed the procedure including the risks, benefits and alternatives for the proposed anesthesia with the patient or authorized representative who has indicated his/her understanding and acceptance.     Dental advisory given  Plan Discussed with: CRNA  Anesthesia Plan Comments:         Anesthesia Quick Evaluation

## 2021-03-14 NOTE — Progress Notes (Addendum)
[employer] Nursing and RT report patient remain lethargic post ERCP.  Patient was on 4-6 liters simple face mask after use of BIPAP post procedure due to difficulty to arouse and tachypnea.. ABG prior to BIPAP pH7.12-pCO248.1- O2100- HCO3 15 with acid base deficit 13 indicative of metabolic acidosis Review of chemistries indicates worsening bicarb deficit and  renal function with creatinine now 1.52, was 1.09 on 5/1.  Unable to determine urine output adequacy from intake and output documentation.   Patient able to awake and slowly respond appropriately.   Admits to feeling sleepy and difficulty to awaken.  Patient is tachypnic but per nursing report she has been since [place].   Currently expiratory wheeze is profoundly evident  Prior chest xray 5/3 showed no evidence of pulmonary edema, and known metastatic disease.  Also, heart upper normal in size.  Recent echo shows EF 70-75%.  IV fluids have been infusing 150 ml/hr.   VBG PH 7.186- pCO2 35.1 -  PO2 122, bicarb 12.8  Repeat chest xray 2100 5/4 IMPRESSION: 1. Widespread metastatic disease to the lungs redemonstrated with new small left pleural effusion. 2. Interval development of ill-defined opacities in the lungs and areas of interstitial prominence. Given the widespread metastatic disease, this may simply reflect developing lymphangitic spread. Alternatively, noncardiogenic pulmonary edema or multifocal atypical infection could be considered.  Metabolic acidosis 1. 3 amps bicarb IVP and start bicarb infusion 2. ABG midnight 3 Discontinue BIPAP initiated by RT 4 oxygen per nasal cannula to keep sats above 92% 5. Strict I & O - bladder scan every 6h as needed for no urine output. In and out cath for bladder scan volumes greater than 350 6. Check procalcitonin and bnp 7. Hold HCTZ until acidosis is corrected 8 close respiratory monitoring. New interstitial prominence could also be inflammatory from cancer or aspiration in addition  to atypical multifocal infection  Updated AM labs - metabolic acidosis and bicarb deficit continue to improve. However renal function is worsening as evident by creatinine now 2.28 up from 1.53 yesterday. Potassium at 5.6, Avapro already discontinued with initial encounter.  May have ATN component and will need time for recovery. Fluid rate not increased at this time as she also has low albumin ad could risk non cardiogenic pulmonary edema Her liver enzymes are improving . Bilirubin now at 20, likely from procedure Her WBC count now at 14.4, could be reactive to procedure but given chest xray results, cannot exclude infection completely at ths time.  Procalcitonin is pending.

## 2021-03-14 NOTE — Brief Op Note (Signed)
03/07/2021 - 04/06/2021  3:19 PM  PATIENT:  Laura Sherman  70 y.o. female  PRE-OPERATIVE DIAGNOSIS:  pancreatic mass, jaundice  POST-OPERATIVE DIAGNOSIS:  EUS-FNA of pancreatic mass and liver lesion   PROCEDURE:  Procedure(s) with comments: FULL UPPER ENDOSCOPIC ULTRASOUND (EUS) RADIAL (N/A) ENDOSCOPIC RETROGRADE CHOLANGIOPANCREATOGRAPHY (ERCP) WITH PROPOFOL (N/A) FINE NEEDLE ASPIRATION SPHINCTEROTOMY - pre-cut  SURGEON:  Surgeon(s) and Role: Panel 1:    Arta Silence, MD - Primary Panel 2:    Ronnette Juniper, MD - Primary  PHYSICIAN ASSISTANT:   ASSISTANTS: Lynett Fish, Earnie Larsson  ANESTHESIA:   MAC  EBL:  Minimal  BLOOD ADMINISTERED:none  DRAINS: none   LOCAL MEDICATIONS USED:  NONE  SPECIMEN:  No Specimen  DISPOSITION OF SPECIMEN:  N/A  COUNTS:  YES  TOURNIQUET:  * No tourniquets in log *  DICTATION: .Dragon Dictation  PLAN OF CARE: Admit to inpatient   PATIENT DISPOSITION:  PACU - hemodynamically stable.   Delay start of Pharmacological VTE agent (>24hrs) due to surgical blood loss or risk of bleeding: yes

## 2021-03-14 NOTE — Anesthesia Procedure Notes (Signed)
Procedure Name: Intubation Performed by: Milford Cage, CRNA Pre-anesthesia Checklist: Patient identified, Emergency Drugs available, Suction available and Patient being monitored Patient Re-evaluated:Patient Re-evaluated prior to induction Oxygen Delivery Method: Circle System Utilized Preoxygenation: Pre-oxygenation with 100% oxygen Induction Type: IV induction Ventilation: Mask ventilation without difficulty Laryngoscope Size: Miller and 2 Grade View: Grade I Tube type: Oral Tube size: 7.0 mm Number of attempts: 1 Airway Equipment and Method: Stylet and Oral airway Placement Confirmation: ETT inserted through vocal cords under direct vision,  positive ETCO2 and breath sounds checked- equal and bilateral Secured at: 21 cm Tube secured with: Tape Dental Injury: Teeth and Oropharynx as per pre-operative assessment

## 2021-03-14 NOTE — Anesthesia Postprocedure Evaluation (Deleted)
Anesthesia Post Note  Patient: Laura Sherman  Procedure(s) Performed: FULL UPPER ENDOSCOPIC ULTRASOUND (EUS) RADIAL (N/A ) FINE NEEDLE ASPIRATION SPHINCTEROTOMY ENDOSCOPIC RETROGRADE CHOLANGIOPANCREATOGRAPHY (ERCP) WITH PROPOFOL (N/A )     Anesthesia Post Evaluation No complications documented.  Last Vitals:  Vitals:   03/23/2021 1600 03/20/2021 1610  BP: (!) 109/52 115/63  Pulse: 67 69  Resp: (!) 24 (!) 26  Temp:    SpO2: 96% 93%    Last Pain:  Vitals:   03/18/2021 1610  TempSrc:   PainSc: 0-No pain                 Jalana Moore L Escher Harr

## 2021-03-14 NOTE — Interval H&P Note (Signed)
History and Physical Interval Note:  04/07/2021 11:53 AM  Laura Sherman  has presented today for surgery, with the diagnosis of pancreatic mass, jaundice.  The various methods of treatment have been discussed with the patient and family. After consideration of risks, benefits and other options for treatment, the patient has consented to  Procedure(s): FULL UPPER ENDOSCOPIC ULTRASOUND (EUS) RADIAL (N/A) ENDOSCOPIC RETROGRADE CHOLANGIOPANCREATOGRAPHY (ERCP) WITH PROPOFOL (N/A) as a surgical intervention.  The patient's history has been reviewed, patient examined, no change in status, stable for surgery.  I have reviewed the patient's chart and labs.  Questions were answered to the patient's satisfaction.     Landry Dyke

## 2021-03-14 NOTE — Op Note (Signed)
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 Estimated blood loss:                            Minimal. Estimated Blood Loss:     Estimated blood loss was minimal. Procedure:                Pre-Anesthesia Assessment:                           - Prior to the procedure, a History and Physical                            was performed, and patient medications and                            allergies were reviewed. The patient's tolerance of                            previous anesthesia was also reviewed. The risks                            and benefits of the procedure and the sedation                            options and risks were discussed with the patient.                            All questions were answered, and informed consent                            was obtained. Prior Anticoagulants: The patient has  taken no previous anticoagulant or antiplatelet                            agents. ASA Grade Assessment: III - A patient with                            severe systemic disease. After reviewing the risks                            and benefits, the patient was deemed in                            satisfactory condition to undergo the procedure.                            After obtaining informed consent, the scope was                            passed under direct vision. Throughout the                            procedure, the patient's blood pressure, pulse, and                            oxygen saturations were monitored continuously. The                            ERCP [id number] was introduced through the mouth, and                            used to inject contrast into without successful                            cannulation. The ERCP was not successful as it was                            extremely difficult. The patient tolerated the                            procedure well. Scope In: Scope Out: Findings:      The scout film was normal. The esophagus was successfully intubated       under direct vision. The scope was advanced to a normal major papilla in       the descending duodenum without detailed examination of the pharynx,       larynx and associated structures, and upper GI tract. The upper GI tract       was grossly normal.      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      There was a lot of intestinal spasm noted and patient had to be given a       total of 2 mg of IV glucagon, in small doses during the procedure.      The ampulla was downward facing and due to presence of diverticulum,  canulation was extremely challenging.      After several attempts at deep canulation was unsuccessful, the patient       was positioned in left lateral position.      At one point, the tip of the tapered sphinctertome could be advanced       rather freely in the direction of the bile duct, however, on injecting,       the dye collected in one area and the bile duct was not visualized.      Pre cut sphincterotomy was performed in an attempt to canulate deeply,       but it did not help.      I also tried various angles and extreme bowing of the sphinctertome       which did not help either.      After  multiple prolonged efforts, the scope was then withdrawn and the       procedure was terminated.      The patient was given rectal indomethacin prior to the start of the       procedure. Impression:               - The ERCP was aborted due to the extreme                            difficulty of the procedure. Moderate Sedation:      Patient did not receive moderate sedation for this procedure, but       instead received monitored anesthesia care. Recommendation:           - Clear liquid diet.                           - NPO after midnight.                           - Repeat ERCP attempt tomorrow. Procedure Code(s):        --- Professional ---                           [phone], Esophagogastroduodenoscopy, flexible,                            transoral; diagnostic, including collection of                            specimen(s) by brushing or washing, when performed                            (separate procedure) Diagnosis Code(s):        --- Professional ---                           D49.0, Neoplasm of unspecified behavior of                            digestive system                           R93.5, Abnormal findings on diagnostic imaging of  other abdominal regions, including retroperitoneum                           R93.2, Abnormal findings on diagnostic imaging of                            liver and biliary tract CPT copyright 2019 [employer]. All rights reserved. The codes documented in this report are preliminary and upon coder review may  be revised to meet current compliance requirements. Ronnette Juniper, MD 03/28/2021 3:19:08 PM This report has been signed electronically. Number of Addenda: 0

## 2021-03-15 ENCOUNTER — Encounter (HOSPITAL_COMMUNITY): Payer: Self-pay | Admitting: Internal Medicine

## 2021-03-15 ENCOUNTER — Encounter (HOSPITAL_COMMUNITY): Admission: EM | Disposition: E | Payer: Self-pay | Source: Home / Self Care | Attending: Internal Medicine

## 2021-03-15 ENCOUNTER — Inpatient Hospital Stay (HOSPITAL_COMMUNITY): Payer: Medicare Other

## 2021-03-15 ENCOUNTER — Inpatient Hospital Stay (HOSPITAL_COMMUNITY): Payer: Medicare Other | Admitting: Certified Registered"

## 2021-03-15 DIAGNOSIS — I471 Supraventricular tachycardia: Secondary | ICD-10-CM | POA: Diagnosis not present

## 2021-03-15 DIAGNOSIS — C801 Malignant (primary) neoplasm, unspecified: Secondary | ICD-10-CM

## 2021-03-15 DIAGNOSIS — K831 Obstruction of bile duct: Secondary | ICD-10-CM

## 2021-03-15 HISTORY — PX: BILIARY STENT PLACEMENT: SHX5538

## 2021-03-15 HISTORY — PX: ENDOSCOPIC RETROGRADE CHOLANGIOPANCREATOGRAPHY (ERCP) WITH PROPOFOL: SHX5810

## 2021-03-15 HISTORY — PX: SPHINCTEROTOMY: SHX5544

## 2021-03-15 LAB — CBC
HCT: 27.6 % — ABNORMAL LOW (ref 36.0–46.0)
HCT: 25.9 % — ABNORMAL LOW (ref 36.0–46.0)
Hemoglobin: 8.6 g/dL — ABNORMAL LOW (ref 12.0–15.0)
Hemoglobin: 7.9 g/dL — ABNORMAL LOW (ref 12.0–15.0)
MCH: 28.8 pg (ref 26.0–34.0)
MCH: 28.8 pg (ref 26.0–34.0)
MCHC: 31.2 g/dL (ref 30.0–36.0)
MCHC: 30.5 g/dL (ref 30.0–36.0)
MCV: 92.3 fL (ref 80.0–100.0)
MCV: 94.5 fL (ref 80.0–100.0)
Platelets: 267 10*3/uL (ref 150–400)
Platelets: 278 10*3/uL (ref 150–400)
RBC: 2.99 MIL/uL — ABNORMAL LOW (ref 3.87–5.11)
RBC: 2.74 MIL/uL — ABNORMAL LOW (ref 3.87–5.11)
RDW: 18.5 % — ABNORMAL HIGH (ref 11.5–15.5)
RDW: 19 % — ABNORMAL HIGH (ref 11.5–15.5)
WBC: 14.6 10*3/uL — ABNORMAL HIGH (ref 4.0–10.5)
WBC: 19 10*3/uL — ABNORMAL HIGH (ref 4.0–10.5)
nRBC: 0 % (ref 0.0–0.2)
nRBC: 0.1 % (ref 0.0–0.2)

## 2021-03-15 LAB — BLOOD GAS, VENOUS
Acid-base deficit: 8.1 mmol/L — ABNORMAL HIGH (ref 0.0–2.0)
Bicarbonate: 17.6 mmol/L — ABNORMAL LOW (ref 20.0–28.0)
O2 Saturation: 93.3 %
Patient temperature: 98.6
pCO2, Ven: 39.6 mmHg — ABNORMAL LOW (ref 44.0–60.0)
pH, Ven: 7.271 (ref 7.250–7.430)
pO2, Ven: 68.1 mmHg — ABNORMAL HIGH (ref 32.0–45.0)

## 2021-03-15 LAB — PROTIME-INR
INR: 1.1 (ref 0.8–1.2)
Prothrombin Time: 14.1 seconds (ref 11.4–15.2)

## 2021-03-15 LAB — COMPREHENSIVE METABOLIC PANEL
ALT: 205 U/L — ABNORMAL HIGH (ref 0–44)
AST: 238 U/L — ABNORMAL HIGH (ref 15–41)
Albumin: 2 g/dL — ABNORMAL LOW (ref 3.5–5.0)
Alkaline Phosphatase: 1605 U/L — ABNORMAL HIGH (ref 38–126)
Anion gap: 10 (ref 5–15)
BUN: 56 mg/dL — ABNORMAL HIGH (ref 8–23)
CO2: 19 mmol/L — ABNORMAL LOW (ref 22–32)
Calcium: 8.4 mg/dL — ABNORMAL LOW (ref 8.9–10.3)
Chloride: 106 mmol/L (ref 98–111)
Creatinine, Ser: 2.28 mg/dL — ABNORMAL HIGH (ref 0.44–1.00)
GFR, Estimated: 22 mL/min — ABNORMAL LOW (ref >60)
Glucose, Bld: 216 mg/dL — ABNORMAL HIGH (ref 70–99)
Potassium: 5.6 mmol/L — ABNORMAL HIGH (ref 3.5–5.1)
Sodium: 135 mmol/L (ref 135–145)
Total Bilirubin: 20 mg/dL (ref 0.3–1.2)
Total Protein: 5.8 g/dL — ABNORMAL LOW (ref 6.5–8.1)

## 2021-03-15 LAB — GLUCOSE, CAPILLARY
Glucose-Capillary: 218 mg/dL — ABNORMAL HIGH (ref 70–99)
Glucose-Capillary: 219 mg/dL — ABNORMAL HIGH (ref 70–99)
Glucose-Capillary: 231 mg/dL — ABNORMAL HIGH (ref 70–99)
Glucose-Capillary: 229 mg/dL — ABNORMAL HIGH (ref 70–99)

## 2021-03-15 LAB — BLOOD GAS, ARTERIAL
Acid-base deficit: 9 mmol/L — ABNORMAL HIGH (ref 0.0–2.0)
Bicarbonate: 17.7 mmol/L — ABNORMAL LOW (ref 20.0–28.0)
O2 Saturation: 95.1 %
Patient temperature: 97.7
pCO2 arterial: 43.8 mmHg (ref 32.0–48.0)
pH, Arterial: 7.226 — ABNORMAL LOW (ref 7.350–7.450)
pO2, Arterial: 78.3 mmHg — ABNORMAL LOW (ref 83.0–108.0)

## 2021-03-15 LAB — PROCALCITONIN: Procalcitonin: 19.15 ng/mL

## 2021-03-15 LAB — MRSA PCR SCREENING: MRSA by PCR: NEGATIVE

## 2021-03-15 LAB — LACTIC ACID, PLASMA
Lactic Acid, Venous: 0.8 mmol/L (ref 0.5–1.9)
Lactic Acid, Venous: 0.7 mmol/L (ref 0.5–1.9)

## 2021-03-15 LAB — CYTOLOGY - NON PAP

## 2021-03-15 LAB — LIPASE, BLOOD: Lipase: 45 U/L (ref 11–51)

## 2021-03-15 SURGERY — ENDOSCOPIC RETROGRADE CHOLANGIOPANCREATOGRAPHY (ERCP) WITH PROPOFOL
Anesthesia: General

## 2021-03-15 MED ORDER — SODIUM CHLORIDE 0.9 % IV SOLN
2.0000 g | INTRAVENOUS | Status: DC
  Administered 2021-03-15 – 2021-03-20 (×6): 2 g via INTRAVENOUS
  Filled 2021-03-15 (×6): qty 2

## 2021-03-15 MED ORDER — GLUCAGON HCL RDNA (DIAGNOSTIC) 1 MG IJ SOLR
INTRAMUSCULAR | Status: AC
  Filled 2021-03-15: qty 1

## 2021-03-15 MED ORDER — SODIUM CHLORIDE 0.9% FLUSH: 10.0000 mL | INTRAVENOUS | Status: DC | PRN

## 2021-03-15 MED ORDER — PROPOFOL 10 MG/ML IV BOLUS
INTRAVENOUS | Status: DC | PRN
  Administered 2021-03-15: 70 mg via INTRAVENOUS

## 2021-03-15 MED ORDER — PHENYLEPHRINE CONCENTRATED 100MG/250ML (0.4 MG/ML) INFUSION SIMPLE
0.0000 ug/min | INTRAVENOUS | Status: DC
  Administered 2021-03-15: 10 ug/min via INTRAVENOUS
  Filled 2021-03-15: qty 250

## 2021-03-15 MED ORDER — CIPROFLOXACIN IN D5W 400 MG/200ML IV SOLN
INTRAVENOUS | Status: AC
  Filled 2021-03-15: qty 200

## 2021-03-15 MED ORDER — FENTANYL CITRATE (PF) 250 MCG/5ML IJ SOLN
INTRAMUSCULAR | Status: DC | PRN
  Administered 2021-03-15: 25 ug via INTRAVENOUS

## 2021-03-15 MED ORDER — SODIUM CHLORIDE 0.9 % IV SOLN: INTRAVENOUS | Status: DC

## 2021-03-15 MED ORDER — ALBUMIN HUMAN 5 % IV SOLN
INTRAVENOUS | Status: AC
  Filled 2021-03-15: qty 250

## 2021-03-15 MED ORDER — DEXAMETHASONE SODIUM PHOSPHATE 10 MG/ML IJ SOLN
INTRAMUSCULAR | Status: DC | PRN
  Administered 2021-03-15: 4 mg via INTRAVENOUS

## 2021-03-15 MED ORDER — ROCURONIUM BROMIDE 10 MG/ML (PF) SYRINGE
PREFILLED_SYRINGE | INTRAVENOUS | Status: DC | PRN
  Administered 2021-03-15: 10 mg via INTRAVENOUS
  Administered 2021-03-15: 40 mg via INTRAVENOUS

## 2021-03-15 MED ORDER — MIDAZOLAM HCL 2 MG/2ML IJ SOLN
INTRAMUSCULAR | Status: DC | PRN
  Administered 2021-03-15: 1 mg via INTRAVENOUS

## 2021-03-15 MED ORDER — CIPROFLOXACIN IN D5W 400 MG/200ML IV SOLN
INTRAVENOUS | Status: DC | PRN
  Administered 2021-03-15: 400 mg via INTRAVENOUS

## 2021-03-15 MED ORDER — PHENYLEPHRINE HCL-NACL 10-0.9 MG/250ML-% IV SOLN
INTRAVENOUS | Status: DC | PRN
  Administered 2021-03-15: 20 ug/min via INTRAVENOUS

## 2021-03-15 MED ORDER — METRONIDAZOLE 500 MG/100ML IV SOLN
500.0000 mg | Freq: Three times a day (TID) | INTRAVENOUS | Status: DC
  Administered 2021-03-15 – 2021-03-21 (×18): 500 mg via INTRAVENOUS
  Filled 2021-03-15 (×16): qty 100

## 2021-03-15 MED ORDER — ORAL CARE MOUTH RINSE
15.0000 mL | Freq: Two times a day (BID) | OROMUCOSAL | Status: DC
  Administered 2021-03-15 – 2021-03-21 (×9): 15 mL via OROMUCOSAL

## 2021-03-15 MED ORDER — VASOPRESSIN 20 UNIT/ML IV SOLN
INTRAVENOUS | Status: DC | PRN
  Administered 2021-03-15 (×7): 1 [IU] via INTRAVENOUS

## 2021-03-15 MED ORDER — ALBUMIN HUMAN 5 % IV SOLN: INTRAVENOUS | Status: DC | PRN

## 2021-03-15 MED ORDER — CHLORHEXIDINE GLUCONATE 0.12 % MT SOLN
15.0000 mL | Freq: Two times a day (BID) | OROMUCOSAL | Status: DC
  Administered 2021-03-16 – 2021-03-21 (×9): 15 mL via OROMUCOSAL
  Filled 2021-03-15 (×12): qty 15

## 2021-03-15 MED ORDER — SODIUM CHLORIDE 0.9 % IV SOLN
INTRAVENOUS | Status: DC | PRN
  Administered 2021-03-15: 20 mL

## 2021-03-15 MED ORDER — VASOPRESSIN 20 UNIT/ML IV SOLN
INTRAVENOUS | Status: AC
  Filled 2021-03-15: qty 1

## 2021-03-15 MED ORDER — MIDAZOLAM HCL 2 MG/2ML IJ SOLN
INTRAMUSCULAR | Status: AC
  Filled 2021-03-15: qty 2

## 2021-03-15 MED ORDER — SUGAMMADEX SODIUM 200 MG/2ML IV SOLN
INTRAVENOUS | Status: DC | PRN
  Administered 2021-03-15: 300 mg via INTRAVENOUS

## 2021-03-15 MED ORDER — PHENYLEPHRINE HCL-NACL 10-0.9 MG/250ML-% IV SOLN
0.0000 ug/min | INTRAVENOUS | Status: DC
  Administered 2021-03-15 (×3): 95 ug/min via INTRAVENOUS
  Filled 2021-03-15 (×4): qty 250

## 2021-03-15 MED ORDER — FENTANYL CITRATE (PF) 100 MCG/2ML IJ SOLN
INTRAMUSCULAR | Status: AC
  Filled 2021-03-15: qty 2

## 2021-03-15 MED ORDER — ALBUMIN HUMAN 5 % IV SOLN
12.5000 g | Freq: Once | INTRAVENOUS | Status: AC
  Administered 2021-03-15: 12.5 g via INTRAVENOUS

## 2021-03-15 MED ORDER — SODIUM CHLORIDE 0.9% FLUSH
10.0000 mL | Freq: Two times a day (BID) | INTRAVENOUS | Status: DC
  Administered 2021-03-16 – 2021-03-21 (×10): 10 mL

## 2021-03-15 MED ORDER — INDOMETHACIN 50 MG RE SUPP
RECTAL | Status: AC
  Filled 2021-03-15: qty 2

## 2021-03-15 MED ORDER — SUCCINYLCHOLINE CHLORIDE 200 MG/10ML IV SOSY
PREFILLED_SYRINGE | INTRAVENOUS | Status: DC | PRN
  Administered 2021-03-15: 120 mg via INTRAVENOUS

## 2021-03-15 MED ORDER — INDOMETHACIN 50 MG RE SUPP
RECTAL | Status: DC | PRN
  Administered 2021-03-15: 100 mg via RECTAL

## 2021-03-15 MED ORDER — ONDANSETRON HCL 4 MG/2ML IJ SOLN
INTRAMUSCULAR | Status: DC | PRN
  Administered 2021-03-15: 4 mg via INTRAVENOUS

## 2021-03-15 MED ORDER — CHLORHEXIDINE GLUCONATE CLOTH 2 % EX PADS
6.0000 | MEDICATED_PAD | Freq: Every day | CUTANEOUS | Status: DC
  Administered 2021-03-15 – 2021-03-21 (×7): 6 via TOPICAL

## 2021-03-15 NOTE — Progress Notes (Signed)
Progress Note  Patient Name: Laura Sherman Date of Encounter: 04/04/2021  [facility] Cardiologist: Fransico Him, MD   Subjective   No further episodes of SVT since 5/3.    Inpatient Medications    Scheduled Meds: . albuterol  2.5 mg Nebulization BID  . B-complex with vitamin C  1 tablet Oral Daily  . budesonide  0.25 mg Nebulization BID  . calcium carbonate  500 mg of elemental calcium Oral Q breakfast  . diltiazem  90 mg Oral Q6H  . fluticasone  1 spray Each Nare BID  . glipiZIDE  2.5 mg Oral QAC breakfast  . guaiFENesin  600 mg Oral BID  . insulin aspart  0-15 Units Subcutaneous TID WC  . insulin aspart  0-5 Units Subcutaneous QHS  . insulin glargine  12 Units Subcutaneous Daily  . loratadine  10 mg Oral Daily  . metoprolol tartrate  100 mg Oral BID  . montelukast  10 mg Oral Daily  . multivitamin with minerals  1 tablet Oral Daily  . pantoprazole  40 mg Oral Daily  . sodium chloride flush  3 mL Intravenous Q12H  . [START ON 03/26/2021] Vitamin D (Ergocalciferol)  50,000 Units Oral Q14 Days  . vitamin E  100 Units Oral Daily   Continuous Infusions: . sodium chloride    .  sodium bicarbonate (isotonic) infusion in sterile water 100 mL/hr at 03/26/2021 2219   PRN Meds: acetaminophen, albuterol, albuterol, guaiFENesin-dextromethorphan, ondansetron **OR** ondansetron (ZOFRAN) IV, oxymetazoline, pseudoephedrine   Vital Signs    Vitals:   04/09/2021 0502 04/01/2021 0600 03/26/2021 0735 04/05/2021 0737  BP:  (!) 106/51    Pulse: 72 68    Resp: (!) 22 (!) 23    Temp:      TempSrc:      SpO2: 95% 95% 94% 96%  Weight:      Height:        Intake/Output Summary (Last 24 hours) at 04/08/2021 0930 Last data filed at 03/23/2021 0600 Gross per 24 hour  Intake 1966.9 ml  Output 250 ml  Net 1716.9 ml   Last 3 Weights 03/25/2021 2021-03-28  Weight (lbs) 216 lb 0.8 oz 216 lb  Weight (kg) 98 kg 97.977 kg      Telemetry    NSR with rare PAC- Personally Reviewed  ECG     N/A  Physical Exam   GEN: very ill appearing and severely jaundiced HEENT: Normal NECK: No JVD; No carotid bruits LYMPHATICS: No lymphadenopathy CARDIAC:RRR, no murmurs, rubs, gallops RESPIRATORY:  Clear to auscultation without rales, wheezing or rhonchi  ABDOMEN: Soft, non-tender, non-distended MUSCULOSKELETAL:  No edema; No deformity  SKIN: Warm and dry NEUROLOGIC:  Alert and oriented x 3 PSYCHIATRIC:  Normal affect    Labs    High Sensitivity Troponin:   Recent Labs  Lab Mar 28, 2021 1113 28-Mar-2021 1420 03/11/21 0505 03/11/21 0804  TROPONINIHS 30* 29* 23* 27*      Chemistry Recent Labs  Lab 03/13/21 0157 03/13/21 0540 03/28/2021 0438 03/28/2021 1554 03/13/2021 0455  NA 129*  --  131* 136 135  K 4.9   < > 5.1 5.5* 5.6*  CL 104  --  110  --  106  CO2 16*  --  14*  --  19*  GLUCOSE 221*  --  203*  --  216*  BUN 44*  --  48*  --  56*  CREATININE 1.43*  --  1.52*  --  2.28*  CALCIUM 8.6*  --  8.5*  --  8.4*  PROT 6.1*  --  5.8*  --  5.8*  ALBUMIN 2.0*  --  1.8*  --  2.0*  AST 321*  --  268*  --  238*  ALT 279*  --  243*  --  205*  ALKPHOS 1,500*  --  1,633*  --  1,605*  BILITOT 15.7*  --  16.7*  --  20.0*  GFRNONAA 39*  --  36*  --  22*  ANIONGAP 9  --  7  --  10   < > = values in this interval not displayed.     Hematology Recent Labs  Lab 02/15/2021 1113 03/09/21 0317 03/31/2021 1554 03/26/2021 0455  WBC 10.4 9.4  --  14.6*  RBC 3.61* 3.35*  --  2.99*  HGB 10.4* 9.8* 9.9* 8.6*  HCT 31.3* 29.4* 29.0* 27.6*  MCV 86.7 87.8  --  92.3  MCH 28.8 29.3  --  28.8  MCHC 33.2 33.3  --  31.2  RDW 16.4* 16.7*  --  18.5*  PLT 282 249  --  267    BNPNo results for input(s): BNP, PROBNP in the last 168 hours.   DDimer  Recent Labs  Lab 03/10/21 2211  DDIMER 5.68*     Radiology    DG CHEST PORT 1 VIEW  Result Date: 03/23/2021 CLINICAL DATA:  70 year old female with history of wheezing. EXAM: PORTABLE CHEST 1 VIEW COMPARISON:  Chest x-ray 03/13/2021. FINDINGS:  Lung volumes are low. Numerous pulmonary nodules are again noted scattered throughout the lungs bilaterally. However, today's study demonstrates more ill-defined opacities and areas of interstitial prominence. New blunting of the left costophrenic sulcus indicative of a new small left pleural effusion. No definite right pleural effusion. Heart size appears normal. The patient is rotated to the right on today's exam, resulting in distortion of the mediastinal contours and reduced diagnostic sensitivity and specificity for mediastinal pathology. Aortic atherosclerosis. IMPRESSION: 1. Widespread metastatic disease to the lungs redemonstrated with new small left pleural effusion. 2. Interval development of ill-defined opacities in the lungs and areas of interstitial prominence. Given the widespread metastatic disease, this may simply reflect developing lymphangitic spread. Alternatively, noncardiogenic pulmonary edema or multifocal atypical infection could be considered. Electronically Signed   By: Vinnie Langton M.D.   On: Mar 15, 2021 21:07   DG CHEST PORT 1 VIEW  Result Date: 03/13/2021 CLINICAL DATA:  Pancreatic carcinoma EXAM: PORTABLE CHEST 1 VIEW COMPARISON:  Chest CT March 08, 2021 FINDINGS: Multiple nodular opacities throughout the lungs consistent with metastatic disease, similar to recent CT examination. No edema or airspace opacity. Heart is upper normal in size with pulmonary vascularity normal. Adenopathy in the mediastinum better appreciated by CT but suggested by radiography. No bone lesions. IMPRESSION: Findings indicative of metastatic disease, similar to recent CT. No edema or airspace opacity. Heart upper normal in size. Electronically Signed   By: Lowella Grip III M.D.   On: 03/13/2021 11:41   DG ERCP BILIARY & PANCREATIC DUCTS  Result Date: Mar 15, 2021 CLINICAL DATA:  Pancreatic mass with liver lesions on MRI. EXAM: ERCP TECHNIQUE: Multiple spot images obtained with the fluoroscopic  device and submitted for interpretation post-procedure. COMPARISON:  MRCP 03/09/2021 FINDINGS: A series of fluoroscopic spot images are submitted documenting endoscopic cannulation with only small volume contrast administration limiting ductal evaluation. IMPRESSION: Limited study with scant contrast administration. These images were submitted for radiologic interpretation only. Please see the procedural report for the amount of contrast and the fluoroscopy  time utilized. Electronically Signed   By: Lucrezia Europe M.D.   On: 03/13/2021 16:58   Cardiac Studies   Echocardiogram 03/11/2021: Impressions: 1. Left ventricular ejection fraction, by estimation, is 70 to 75%. The  left ventricle has hyperdynamic function. The left ventricle has no  regional wall motion abnormalities. There is mild left ventricular  hypertrophy. Left ventricular diastolic  parameters are consistent with Grade I diastolic dysfunction (impaired  relaxation).  2. Right ventricular systolic function is normal. The right ventricular  size is normal.  3. Left atrial size was mildly dilated.  4. The mitral valve is normal in structure. No evidence of mitral valve  regurgitation. No evidence of mitral stenosis. Moderate mitral annular  calcification.  5. The aortic valve is normal in structure. Aortic valve regurgitation is  not visualized. No aortic stenosis is present.  6. The inferior vena cava is normal in size with greater than 50%  respiratory variability, suggesting right atrial pressure of 3 mmHg.    Patient Profile     70 y.o. female with a historyof hypertension, diabetes mellitus, GERD, breast cancer in 2005 s/p chemo and radiation, and uterine cancer in 2010 s/p total hysterectomywho is being seen today for the evaluation ofSVTat the request ofDr. Sarajane Jews.  Patient presented with weakness and jaundice and found to have suspected pancreatic cancer with metastasis to liver and lungs. Cardiology consulted  for paroxysmal SVT which was felt to be most consistent with PAT.  Assessment & Plan    1. PAT/SVT - HR and ectopy has significantly improved on cardizem - Electrolytes stable - TSH normal - Echo showed LVEF of 70-75% with normal wall motion and grade 1 diastolic dysfunction. -Continue Lopressor 100mg  twice daily and Cardizem 90mg  q6 hours as BP tolerates  2. AKI - Creatinine 2.38 on admission. Secondary to poor oral intake. Improved with IV fluids up to 1.09 on 5/2 then 1.43>>1.52. On fluids. Got lasix 40mg  x 1 on 5/3.  - SCr up to 2.28 today - CXR without edema yesterday  - Management per primary team   Otherwise per primary team   [facility] will sign off.   Medication Recommendations: Lopressor 100mg  BID and Cardizem CD (final dose to be determined based on what her BP has been running per [facility]) Other recommendations (labs, testing, etc):  none Follow up as an outpatient:  Followup in 2-3 weeks in our office  For questions or updates, please contact [place] Please consult [email or web address] for contact info under        Signed, Fransico Him, MD  04/04/2021, 9:30 AM

## 2021-03-15 NOTE — Progress Notes (Signed)
PT Cancellation Note  Patient Details Name: Laura Sherman MRN: [id number] DOB: [birth year]   Cancelled Treatment:    Reason Eval/Treat Not Completed: Patient at procedure or test/unavailable   Denim Start,[name] 03/23/2021, 11:43 AM [name], DPT Acute Rehabilitation Services Pager: [phone] Office: [phone]

## 2021-03-15 NOTE — Anesthesia Preprocedure Evaluation (Signed)
Anesthesia Evaluation  Patient identified by MRN, date of birth, ID band Patient awake    Reviewed: Allergy & Precautions, H&P , NPO status , Patient's Chart, lab work & pertinent test results, reviewed documented beta blocker date and time   Airway Mallampati: III  TM Distance: >3 FB Neck ROM: Full    Dental no notable dental hx. (+) Teeth Intact, Dental Advisory Given   Pulmonary asthma ,    Pulmonary exam normal breath sounds clear to auscultation       Cardiovascular hypertension, Pt. on medications and Pt. on home beta blockers  Rhythm:Regular Rate:Normal     Neuro/Psych negative neurological ROS  negative psych ROS   GI/Hepatic Neg liver ROS, GERD  Medicated,  Endo/Other  diabetes, Type 2, Oral Hypoglycemic AgentsMorbid obesity  Renal/GU Renal InsufficiencyRenal disease  negative genitourinary   Musculoskeletal  (+) Arthritis , Osteoarthritis,    Abdominal   Peds  Hematology negative hematology ROS (+)   Anesthesia Other Findings   Reproductive/Obstetrics negative OB ROS                             Anesthesia Physical  Anesthesia Plan  ASA: III  Anesthesia Plan: General   Post-op Pain Management:    Induction: Intravenous  PONV Risk Score and Plan: 3 and Ondansetron and Dexamethasone  Airway Management Planned: Oral ETT  Additional Equipment:   Intra-op Plan:   Post-operative Plan: Extubation in OR  Informed Consent: I have reviewed the patients History and Physical, chart, labs and discussed the procedure including the risks, benefits and alternatives for the proposed anesthesia with the patient or authorized representative who has indicated his/her understanding and acceptance.     Dental advisory given  Plan Discussed with: CRNA  Anesthesia Plan Comments: (ASA III - Acidotic, anemic with declining renal function)        Anesthesia Quick Evaluation

## 2021-03-15 NOTE — Op Note (Signed)
[facility] Patient Name: Laura Sherman Procedure Date: 04/07/2021 MRN: [id number] Attending MD: Clarene Essex , MD Date of Birth: [birth year] CSN: [id number] Age: 70 Admit Type: Inpatient Procedure:                ERCP Indications:              Obstructive jaundice, Malignant tumor of the lower                            third of the main bile duct cholangio versus                            pancreatic Providers:                Clarene Essex, MD, Grace Isaac, RN, Cherylynn Ridges,                            Technician, Jefm Miles CRNA Referring MD:              Medicines:                General Anesthesia Complications:            No immediate complications. Estimated Blood Loss:     Estimated blood loss: none. Procedure:                Pre-Anesthesia Assessment:                           - Prior to the procedure, a History and Physical                            was performed, and patient medications and                            allergies were reviewed. The patient's tolerance of                            previous anesthesia was also reviewed. The risks                            and benefits of the procedure and the sedation                            options and risks were discussed with the patient.                            All questions were answered, and informed consent                            was obtained. Prior Anticoagulants: The patient has                            taken no previous anticoagulant or antiplatelet                            agents. ASA Grade  Assessment: III - A patient with                            severe systemic disease. After reviewing the risks                            and benefits, the patient was deemed in                            satisfactory condition to undergo the procedure.                           After obtaining informed consent, the scope was                            passed under direct vision. Throughout the                             procedure, the patient's blood pressure, pulse, and                            oxygen saturations were monitored continuously. The                            ERCP [id number] was introduced through the mouth, and                            used to inject contrast into and used to cannulate                            the bile duct. The ERCP was technically difficult                            and complex due to difficulty passing guidewires                            through biliary ductal stenosis. Successful                            completion of the procedure was aided by performing                            the maneuvers documented (below) in this report.                            The patient tolerated the procedure well. 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 A major papilla       precut sphincterotomy had been performed. We began the procedure with       the patient on her left side and use the smaller sphincterotome and the       smaller wire and deep cannulation was readily obtained but we could not       advance the wire  so we increased the biliary sphincterotomy was extended       two different time to a total of about 5 mm in length with a Hydratome       sphincterotome using ERBE electrocautery. There was no       post-sphincterotomy bleeding. We increased at the second time because we       were again unsuccessful with advancing the wire and after the second       increase we were still unsuccessful so we changed to the revolution wire       and with a slightly prolonged effort we were able to get deep selective       cannulation and the sphincterotome was able to follow the wire above the       stricture and dye was injected and because the patient was on her side       the biliary tree was quite abnormal and the distal stricture was       confirmed and we elected to place one 10 Fr by 6 cm uncovered metal        stent with no external flaps and no internal flaps was placed 5.5 cm       into the common bile duct. Bile and necrotic tissue flowed through the       stent. The stent was in good position. The introducer and wire and scope       were removed and the patient tolerated the procedure well and not       mentioned above there was no pancreatic duct injection or wire       advancement throughout the procedure Impression:               - The major papilla was on the rim of a                            diverticulum.                           - Prior major papilla sphincterotomy was seen                           - A biliary sphincterotomy was performed x2 as                            above.                           - One uncovered metal stent was placed into the                            common bile duct. Moderate Sedation:      Not Applicable - Patient had care per Anesthesia. Recommendation:           - Clear liquid diet today. If doing well may slowly                            advance tomorrow                           - Continue present medications.                           -  Return to GI clinic PRN.                           - Telephone GI clinic if symptomatic PRN.                           - Check liver enzymes (AST, ALT, alkaline                            phosphatase, bilirubin) and hemogram with white                            blood cell count and platelets in the morning. And                            follow back to near normal as an outpatient and                            further work-up and plans per oncology team as long                            as no delayed complications Procedure Code(s):        --- Professional ---                           [phone], Endoscopic retrograde                            cholangiopancreatography (ERCP); with placement of                            endoscopic stent into biliary or pancreatic duct,                            including  pre- and post-dilation and guide wire                            passage, when performed, including sphincterotomy,                            when performed, each stent Diagnosis Code(s):        --- Professional ---                           R17, Unspecified jaundice                           C24.0, Malignant neoplasm of extrahepatic bile duct CPT copyright 2019 [employer]. All rights reserved. The codes documented in this report are preliminary and upon coder review may  be revised to meet current compliance requirements. Clarene Essex, MD 04/10/2021 2:43:12 PM This report has been signed electronically. Number of Addenda: 0

## 2021-03-15 NOTE — Progress Notes (Signed)
Pt resting but easily arrousable. Pt alert to person, place, president, and why here.  No respiratory distress at this time.  BiPAP not indicated.

## 2021-03-15 NOTE — Anesthesia Procedure Notes (Signed)
Central Venous Catheter Insertion Performed by: Effie Berkshire, MD, anesthesiologist Start/End05/21/2022 12:08 PM, 03/15/2021 12:17 PM Patient location: Pre-op. Preanesthetic checklist: patient identified, IV checked, site marked, risks and benefits discussed, surgical consent, monitors and equipment checked, pre-op evaluation, timeout performed and anesthesia consent Position: Trendelenburg Lidocaine 1% used for infiltration and patient sedated Hand hygiene performed , maximum sterile barriers used  and Seldinger technique used Catheter size: 8 Fr Total catheter length 16. Central line was placed.Double lumen Procedure performed using ultrasound guided technique. Ultrasound Notes:anatomy identified, needle tip was noted to be adjacent to the nerve/plexus identified, no ultrasound evidence of intravascular and/or intraneural injection and image(s) printed for medical record Attempts: 1 Following insertion, dressing applied, line sutured and Biopatch. Post procedure assessment: blood return through all ports  Patient tolerated the procedure well with no immediate complications.

## 2021-03-15 NOTE — Transfer of Care (Signed)
Immediate Anesthesia Transfer of Care Note  Patient: Laura Sherman  Procedure(s) Performed: ENDOSCOPIC RETROGRADE CHOLANGIOPANCREATOGRAPHY (ERCP) WITH PROPOFOL (N/A ) SPHINCTEROTOMY BILIARY STENT PLACEMENT (N/A )  Patient Location: PACU  Anesthesia Type:General  Level of Consciousness: awake, alert  and sedated  Airway & Oxygen Therapy: Patient Spontanous Breathing and Patient connected to face mask oxygen  Post-op Assessment: Report given to RN  Post vital signs: Reviewed and stable  Last Vitals:  Vitals Value Taken Time  BP 113/64 03/21/2021 1452  Temp 36.7 C 03/27/2021 1452  Pulse 70 03/14/2021 1458  Resp 22 04/08/2021 1458  SpO2 93 % 03/21/2021 1458  Vitals shown include unvalidated device data.  Last Pain:  Vitals:   03/25/2021 1452  TempSrc: Axillary  PainSc: Asleep      Patients Stated Pain Goal: 0 (49/82/64 1583)  Complications: No complications documented.

## 2021-03-15 NOTE — Progress Notes (Signed)
Assisted with transporting PT from [facility] to [facility] ICU while on BiPAP- uneventful. RN at bedside.

## 2021-03-15 NOTE — Progress Notes (Signed)
Laura Sherman 12:50 PM  Subjective: Patient seen and examined and case discussed with my partners Dr. Therisa Doyne and Paulita Fujita and she had no problems from her procedure yesterday and we rediscussed another try versus PTC she has no other complaints  Objective: Vital signs stable afebrile exam please see preassessment evaluation slight increased bili increased BUN and creatinine MRI and yesterday's procedure is reviewed  Assessment: Active jaundice secondary to malignancy probably cholangiogram  Plan: Okay to proceed with repeat ERCP with anesthesia assistance with further work-up and plans pending our success  [facility]  office [phone] After 5PM or if no answer call [phone]

## 2021-03-15 NOTE — Addendum Note (Signed)
Addendum  created 04/08/2021 1542 by Pervis Hocking, DO   Order list changed, Pharmacy for encounter modified

## 2021-03-15 NOTE — Consult Note (Signed)
NAME:  Laura Sherman, MRN:  [id number], DOB:  09-07-1951, LOS: 7 ADMISSION DATE:  02/17/2021, CONSULTATION DATE:  04/08/2021 REFERRING MD:  Dr. Avon Gully, CHIEF COMPLAINT:  Hypotension   History of Present Illness:  Laura Sherman is a 70 y.o. femal with PMH significant for uterine cancer, breast cancer, HTN, GERD, type II diabetes, and asthma who presented 4/28 with complaints of failure to thrive, anorexia, and fatigue. ON ED arrival she was seen with AKI, hyperkalemia, and jaundice with recent diagnosis of metastatic liver disease. She was admitted per [facility] with GI consult.   Thus far during hospitalization patient has underwent attempted ERCP 5/4 (initially delayed due to coagulapathy) that was unsuccessful. Repeat ERCP with stent was completed 5/5. Post Repeat ERCP patient was seen with hypotension requiring pressor support and respiratory distress requiring BIPAP therapy. PCCM consulted for further assistance in management.   Pertinent  Medical History  Uterine cancer, breast cancer, HTN, GERD, type II diabetes, and asthma  Significant Hospital Events: Including procedures, antibiotic start and stop dates in addition to other pertinent events   . 4/28 Admitted with complaints of failure to thrive, anorexia, and fatigue . 5/1 vitamin K given for persistent coagulapathy   . 5/4 Attempted ERCP was unsuccessful  . 5/5 Repeat ERCP with stent was completed 5/5. Post Repeat ERCP patient was seen with hypotension requiring pressor support and respiratory distress requiring BIPAP therapy  Interim History / Subjective:    Objective   Blood pressure (!) 117/49, pulse 73, temperature 98.1 F (36.7 C), temperature source Axillary, resp. rate 18, height 5\' 3"  (1.6 m), weight 98 kg, SpO2 94 %.        Intake/Output Summary (Last 24 hours) at 03/25/2021 1636 Last data filed at 04/04/2021 1430 Gross per 24 hour  Intake 1616.9 ml  Output 250 ml  Net 1366.9 ml   Filed Weights   02/10/2021 1046  04/07/2021 1123  Weight: 98 kg 98 kg    Examination: General: Acute on chronic ill appearing elderly female lying in bed on BIPAP in NAD HEENT: Tea/AT, Bipap in place, MM pink/moist, PERRL, sclera icteric  Neuro: Alert and able to follow simple commands, lethargic  CV: s1s2 regular rate and rhythm, no murmur, rubs, or gallops,  PULM:  Mechanical breath sounds, bipap in place, no increased work of breathing  GI: soft, bowel sounds active in all 4 quadrants, non-tender, non-distended Extremities: warm/dry, no edema  Skin: severe jaundice,no rashes or lesions  Labs/imaging that I havepersonally reviewed     Resolved Hospital Problem list     Assessment & Plan:  Hypotension  -Felt secondary to sedation patient required initiation of Neo after paralytics received for intubation  -Need to rule out SIRS vs evolving sepsis given elevated procalcitonin and worsening leukocytosis  P: Transfer to ICU for close monitoring  Supplemental oxygen via BIPAP as below  Low threshold to optain blood cultures  Will discuss with attending need for IV antibiotics  Gentle IV hydration MAP< 65 continue Neo  Obtain / Trend lactic acid Procalcitonin Monitor urine output  Obstructive jaundice with CT evidence of pancreatic malignancy s/p ERCP with stent  Elevated LFTs -MELD score 28 Coagulapathy, improved  -Vit K administered 5/1, INR 1.1 5/5 P: Primary management per GI Avoid hepatotoxins  Monitor for signs of pancreatitis post ERCP  Encourage oral hydration   High suspicion for metastatic cancer -ABD CT with pancreatic lesion, multiple lung nodules, and multiple ill-defines areas of concern within the liver -Patient wants to  follow-up with her oncologist in [facility] Dr. Ilona Sorrel Chinnasami  P: Outpatient follow up   Acute Hypoxic Respiratory Failure  -Developed post repeat ERCP, felt secondary to sedation  P: BIPAP as needed for spo2 greater than 90 Wean PEEP and FiO2 for sats greater  than 90%. Head of bed elevated 30 degrees. Follow intermittent chest x-ray and ABG.   Ensure adequate pulmonary hygiene  Minimize sedation PRN BDs  Paroxysmal SVT/PAT -ECHO with EF 70-75% with grade 1 diastolic dysfunction  Hx of HTN  P: Cardiology following, appreciate assistance Continuous telemetry  Continue scheduled beta blocker Diltiazem started 5/2, continue Daily weight   Acute Kidney Injury  -Patient initally presented with AKI, creatinine 2.38 on admisison. AKI improved with hydration but creatinine again elevated 2.28 5/5. Patient did receive lasix x1 5/4  Hyperkalemia  P: Follow renal function / urine output Trend Bmet Avoid nephrotoxins Ensure adequate renal perfusion  IV hydration No further diuretics   Type 2 diabetes P: SSI CBG q4 Continue Glipizide and Lantus    Best practice   Diet:  NPO while on BIPAP, advance per GI Pain/Anxiety/Delirium protocol (if indicated): No VAP protocol (if indicated): Not indicated DVT prophylaxis: Contraindicated GI prophylaxis: PPI Glucose control:  SSI Yes Central venous access:  Yes, and it is still needed Arterial line:  N/A Foley:  N/A Mobility:  OOB  PT consulted: Yes Last date of multidisciplinary goals of care discussion Per primary  Code Status:  full code Disposition: ICU  Labs   CBC: Recent Labs  Lab 03/09/21 0317 03/31/2021 1554 04/05/2021 0455  WBC 9.4  --  14.6*  HGB 9.8* 9.9* 8.6*  HCT 29.4* 29.0* 27.6*  MCV 87.8  --  92.3  PLT 249  --  99991111    Basic Metabolic Panel: Recent Labs  Lab 03/09/21 0926 03/10/21 0424 03/11/21 0505 03/12/21 0502 03/12/21 1023 03/13/21 0157 03/13/21 0540 03/11/2021 0438 04/02/2021 1554 04/04/2021 0455  NA  --  132* 137 131*  --  129*  --  131* 136 135  K  --  4.9 5.2* 6.0*   < > 4.9 5.1 5.1 5.5* 5.6*  CL  --  105 112* 108  --  104  --  110  --  106  CO2  --  18* 17* 13*  --  16*  --  14*  --  19*  GLUCOSE  --  192* 178* 222*  --  221*  --  203*  --  216*  BUN   --  48* 41* 43*  --  44*  --  48*  --  56*  CREATININE  --  1.24* 1.09* 1.09*  --  1.43*  --  1.52*  --  2.28*  CALCIUM  --  8.7* 8.8* 8.4*  --  8.6*  --  8.5*  --  8.4*  MG 1.8 2.3 2.1  --   --  1.9  --   --   --   --   PHOS 2.7 2.8  --   --   --  3.6  --   --   --   --    < > = values in this interval not displayed.   GFR: Estimated Creatinine Clearance: 25.2 mL/min (A) (by C-G formula based on SCr of 2.28 mg/dL (H)). Recent Labs  Lab 02/11/2021 1807 03/09/21 0317 04/10/2021 0455  PROCALCITON  --   --  19.15  WBC  --  9.4 14.6*  LATICACIDVEN 1.0  --   --  Liver Function Tests: Recent Labs  Lab 03/11/21 0505 03/12/21 0502 03/13/21 0157 03/16/2021 0438 03/31/2021 0455  AST 436* 353* 321* 268* 238*  ALT 338* 293* 279* 243* 205*  ALKPHOS 1,710* 1,615* 1,500* 1,633* 1,605*  BILITOT 13.3* 14.2* 15.7* 16.7* 20.0*  PROT 6.0* 5.2* 6.1* 5.8* 5.8*  ALBUMIN 2.1* 1.7* 2.0* 1.8* 2.0*   No results for input(s): LIPASE, AMYLASE in the last 168 hours. No results for input(s): AMMONIA in the last 168 hours.  ABG    Component Value Date/Time   PHART 7.226 (L) 03/12/2021 0030   PCO2ART 43.8 03/14/2021 0030   PO2ART 78.3 (L) 03/14/2021 0030   HCO3 17.6 (L) 03/28/2021 0524   TCO2 17 (L) 03/18/2021 1554   ACIDBASEDEF 8.1 (H) 03/24/2021 0524   O2SAT 93.3 03/19/2021 0524     Coagulation Profile: Recent Labs  Lab 03/11/21 0505 03/12/21 1023 03/13/21 0157 03/16/2021 0438 03/28/2021 0455  INR 3.2* 1.9* 1.2 1.2 1.1    Cardiac Enzymes: No results for input(s): CKTOTAL, CKMB, CKMBINDEX, TROPONINI in the last 168 hours.  HbA1C: Hgb A1c MFr Bld  Date/Time Value Ref Range Status  03/09/2021 03:17 AM 8.0 (H) 4.8 - 5.6 % Final    Comment:    (NOTE) Pre diabetes:          5.7%-6.4%  Diabetes:              >6.4%  Glycemic control for   <7.0% adults with diabetes     CBG: Recent Labs  Lab 03/25/2021 1151 03/26/2021 2005 03/20/2021 2227 04/04/2021 0800 03/26/2021 1108  GLUCAP 185* 217*  206* 218* 219*    Review of Systems:   Please see the history of present illness. All other systems reviewed and are negative   Past Medical History:  She,  has a past medical history of Arthritis, Asthma, Breast cancer (HCC) (2005), Diabetes mellitus without complication (HCC), Environmental allergies, GERD (gastroesophageal reflux disease), Hypertension, and Uterine cancer (HCC) (2010).   Surgical History:   Past Surgical History:  Procedure Laterality Date  . ABDOMINAL HYSTERECTOMY    . BACK SURGERY    . BREAST SURGERY  2005   chemo, XRT  . ENDOSCOPIC RETROGRADE CHOLANGIOPANCREATOGRAPHY (ERCP) WITH PROPOFOL N/A 04/01/2021   Procedure: ENDOSCOPIC RETROGRADE CHOLANGIOPANCREATOGRAPHY (ERCP) WITH PROPOFOL;  Surgeon: Willis Modena, MD;  Location: [facility];  Service: Endoscopy;  Laterality: N/A;  . ENDOSCOPIC RETROGRADE CHOLANGIOPANCREATOGRAPHY (ERCP) WITH PROPOFOL N/A 03/26/2021   Procedure: ENDOSCOPIC RETROGRADE CHOLANGIOPANCREATOGRAPHY (ERCP) WITH PROPOFOL;  Surgeon: Kerin Salen, MD;  Location: [facility];  Service: Gastroenterology;  Laterality: N/A;  . ESOPHAGOGASTRODUODENOSCOPY (EGD) WITH PROPOFOL N/A 03/17/2021   Procedure: ESOPHAGOGASTRODUODENOSCOPY (EGD) WITH PROPOFOL;  Surgeon: Willis Modena, MD;  Location: [facility];  Service: Endoscopy;  Laterality: N/A;  . EUS N/A 04/10/2021   Procedure: FULL UPPER ENDOSCOPIC ULTRASOUND (EUS) RADIAL;  Surgeon: Willis Modena, MD;  Location: [facility];  Service: Endoscopy;  Laterality: N/A;  . FINE NEEDLE ASPIRATION  03/20/2021   Procedure: FINE NEEDLE ASPIRATION;  Surgeon: Willis Modena, MD;  Location: [facility];  Service: Endoscopy;;  . SPHINCTEROTOMY  03/18/2021   Procedure: SPHINCTEROTOMY;  Surgeon: Willis Modena, MD;  Location: [facility];  Service: Endoscopy;;  pre-cut  . TONSILLECTOMY       Social History:   reports that she has never smoked. She has never used smokeless tobacco. She reports that she does not drink  alcohol and does not use drugs.   Family History:  Her family history includes COPD in her mother.  Allergies Allergies  Allergen Reactions  . Erythromycin Nausea Only    Other Reactions: GI Upset Other Reactions: GI Upset   . Penicillins Rash  . Sulfa Antibiotics Nausea Only    Other Reactions: GI Upset  . Tape Rash    Bandaid     Home Medications  Prior to Admission medications   Medication Sig Start Date End Date Taking? Authorizing Provider  acetaminophen (TYLENOL) 650 MG CR tablet Take 650 mg by mouth every 8 (eight) hours as needed for pain.   Yes [provider]  albuterol (VENTOLIN HFA) 108 (90 Base) MCG/ACT inhaler Inhale 2 puffs into the lungs every 6 (six) hours as needed for wheezing or shortness of breath.   Yes [provider]  amLODipine (NORVASC) 10 MG tablet Take 10 mg by mouth daily.   Yes [provider]  atorvastatin (LIPITOR) 20 MG tablet Take 20 mg by mouth daily.   Yes [provider]  B Complex Vitamins (VITAMIN B COMPLEX PO) Take 1 tablet by mouth daily.   Yes [provider]  calcium carbonate (OSCAL) 1500 (600 Ca) MG TABS tablet Take 600 mg of elemental calcium by mouth daily.   Yes [provider]  diclofenac (VOLTAREN) 75 MG EC tablet Take 75 mg by mouth 2 (two) times daily.   Yes [provider]  ergocalciferol (VITAMIN D2) 1.25 MG (50000 UT) capsule Take 50,000 Units by mouth every 14 (fourteen) days. 08/12/16  Yes [provider]  fexofenadine (ALLEGRA) 60 MG tablet Take 60 mg by mouth daily.   Yes [provider]  fluticasone (FLONASE) 50 MCG/ACT nasal spray Place 1 spray into both nostrils daily.   Yes [provider]  fluticasone (FLOVENT DISKUS) 50 MCG/BLIST diskus inhaler Inhale 1 puff into the lungs 2 (two) times daily.   Yes [provider]  glipiZIDE (GLUCOTROL) 5 MG tablet Take 2.5 mg by mouth daily. 02/20/21  Yes [provider]   hydrochlorothiazide (HYDRODIURIL) 25 MG tablet Take 25 mg by mouth daily.   Yes [provider]  loratadine (CLARITIN) 10 MG tablet Take 10 mg by mouth daily.   Yes [provider]  losartan-hydrochlorothiazide (HYZAAR) 100-25 MG tablet Take 1 tablet by mouth daily.   Yes [provider]  metFORMIN (GLUCOPHAGE) 500 MG tablet Take by mouth 2 (two) times daily with a meal.   Yes [provider]  metoprolol (LOPRESSOR) 100 MG tablet Take 100 mg by mouth 2 (two) times daily.   Yes [provider]  montelukast (SINGULAIR) 10 MG tablet Take 10 mg by mouth daily. 02/24/21  Yes [provider]  Multiple Vitamin (MULTIVITAMIN WITH MINERALS) TABS tablet Take 1 tablet by mouth daily.   Yes [provider]  olmesartan (BENICAR) 40 MG tablet Take 40 mg by mouth daily. 01/08/21  Yes [provider]  omeprazole (PRILOSEC) 10 MG capsule Take 10 mg by mouth daily.   Yes [provider]  oxymetazoline (AFRIN) 0.05 % nasal spray Place 1 spray into both nostrils daily as needed for congestion.   Yes [provider]  pioglitazone (ACTOS) 30 MG tablet Take 30 mg by mouth daily.   Yes [provider]  Pseudoephedrine-Guaifenesin [id number] MG TB12 Take 1 tablet by mouth 2 (two) times daily.   Yes [provider]  Vitamin E 100 units TABS Take 100 Units by mouth daily.   Yes [provider]     Critical care time:   Performed by: Guinevere Scarlet  Taylen Wendland  Total critical care time: 45 minutes  Critical care time was exclusive of separately billable procedures and treating other patients.  Critical care was necessary to treat or prevent imminent or life-threatening deterioration.  Critical care was time spent personally by me on the following activities: development of treatment plan with patient and/or surrogate as well as nursing, discussions with consultants, evaluation of patient's response to treatment,  examination of patient, obtaining history from patient or surrogate, ordering and performing treatments and interventions, ordering and review of laboratory studies, ordering and review of radiographic studies, pulse oximetry and re-evaluation of patient's condition.  Johnsie Cancel, NP-C [facility] Personal contact information can be found on Amion  If no response please page: Adult pulmonary and critical care medicine pager on Amion unitl 7pm After 7pm please call [phone] 03-25-2021, 5:09 PM

## 2021-03-15 NOTE — Progress Notes (Signed)
PROGRESS NOTE    Laura Sherman  VOJ:[id number] DOB: July 12, 1951 DOA: 02/14/2021 PCP: Reita Cliche, MD   Brief Narrative:  70 year old woman PMH breast cancer, uterine cancer, diabetes mellitus type 2, hypertension presented to the emergency department with 1 week history of failure to thrive, anorexia, fatigue. 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 Seen by gastroenterology with plans for ERCP and EUS, delayed by coagulopathy.  Attempted ERCP on 04/05/2021 was unsuccessful, repeat ERCP with stent placement successful on 04/03/2021 with moderate hypotension and respiratory distress transferred to the ICU for closer monitoring overnight.  Assessment & Plan:   Principal Problem:   Obstructive jaundice due to malignant neoplasm (HCC) Active Problems:   AKI (acute kidney injury) ([place])   Hyperkalemia   DM type 2 (diabetes mellitus, type 2) (HCC)   Coagulopathy (HCC)   Aortic atherosclerosis (HCC)   Hypercalcemia   Obstructive jaundice, elevated LFTs, abnormal CT and MRI secondary to obstructing adenocarcinoma. - LFTs stable.  Jaundice is significant.   - ERCP unsuccessful 03/18/2021, repeat attempt today with stent placement, unfortunately hypotensive with some respiratory distress postprocedure, transferred to the ICU as below - MELD 28 today  Acute hypoxic respiratory failure/hypotension/encephalopathy -Likely in the setting of prolonged procedure and anesthesia -PCCM consulted for assistance with weaning of Neo and BiPAP which patient is currently dependent on Toqeer  Pancreatic mass, multiple lung nodules, multiple hepatic lesions consistent with metastatic adenocarcinoma. - ERCP as above successful stent placement 5/5 - Patient wants to follow-up with her oncologist in [facility] Dr. Dustin Folks -contacted by previous physician on 03/12/21 to arrange close outpatient follow-up. Okay to use right arm  for blood draws and IV.  Coagulopathy, most likely secondary to metastatic liver disease - Coagulopathy corrected with vitamin K. - Patient and family understand risk of VTE given malignancy.  Acute kidney injury secondary to poor oral intake/prerenal azotemia. No cardiac history. - Creatinine up today after 1 dose of Lasix yesterday for hyperkalemia.  Continue IV fluids today.  Recheck in a.m.  SVT -TSH within normal limits.  2D echocardiogram unremarkable.  --Electrolytes stable.  Continues to have intermittent SVT despite metoprolol and addition of diltiazem.  Appreciate cardiology management.  Diltiazem increased today.  Diabetes mellitus type 2 with hyperglycemia hemoglobin A1c 8.0 - Continue to hold oral medications.  Still hyperglycemic.  Will increase Lantus further.  Continue sliding scale insulin.  Hyponatremia secondary to poor oral intake, corrected 131 - resolved.   Aortic atherosclerosis -No treatment indicated  RESOLVED: Hyperkalemia secondary to acute kidney injury -- Resolved with treatment and IV fluids.  Hypercalcemia, likely secondary to malignancy -- Resolved with hydration.  Trivial troponin elevation --Troponins flat, no further evaluation suggested. Unclear why checked, no signs or symptoms to suggest ACS. EKG nonacute.   PMH  Endometrial carcinoma status post robotic assisted hysterectomy and bilateral salpingo-oophorectomy in 2014. 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, radiation and chemo post mastectomy ER positive HER-2/neu negative completed antiestrogen therapy  DVT prophylaxis: SCDs only perioperatively Code Status: Full Family Communication: None  Status is: Inpatient  Dispo: The patient is from: Home              Anticipated d/c is to: TBD              Anticipated d/c date is: >72 hours pending clinical course  Patient currently not medically stable for discharge  Consultants:   GI  Cardiology  PCCM  Palliative Care  Procedures/Imaging:   CT chest, abd, pelvis w/ pancreatic mass, hepatic and lung lesions concerning for metastatic disease  MRI w/ multiple liver lesions c/w metastatic disease, associated with biliary and pancreatic duct obstruction, lesion in the head of the pancreas, suspected pancreatic adenocarcinoma, multiple pulmonary nodules  Echo LVEF 07-37%, grade 1 diastolic dysfunction  Antimicrobials:  None  Subjective: No acute issues or events overnight patient somewhat anxious for procedure this morning with daughter and husband at bedside.  Unfortunately patient had moderate hypotension during the procedure with respiratory distress requiring BiPAP.  She will be transferred to the ICU this afternoon for close monitoring given need for neo and BiPAP, PCCM has been made aware.  Objective: Vitals:   03/25/2021 0306 04/03/2021 0400 04/01/2021 0502 04/06/2021 0600  BP:  (!) 118/58  (!) 106/51  Pulse: 66 68 72 68  Resp: (!) 21 (!) 26 (!) 22 (!) 23  Temp:  98.7 F (37.1 C)    TempSrc:  Oral    SpO2: 95% 95% 95% 95%  Weight:      Height:        Intake/Output Summary (Last 24 hours) at 03/12/2021 0709 Last data filed at 03/21/2021 0500 Gross per 24 hour  Intake 1203 ml  Output 850 ml  Net 353 ml   Filed Weights   02/19/2021 1046 03/12/2021 1123  Weight: 98 kg 98 kg    Examination:  General:  Pleasantly resting in bed, No acute distress. HEENT:  Normocephalic atraumatic.  Sclerae moderately icteric, noninjected.  Extraocular movements intact bilaterally. Neck:  Without mass or deformity.  Trachea is midline. Lungs:  Clear to auscultate bilaterally without rhonchi, wheeze, or rales. Heart:  Regular rate and rhythm.  Without murmurs, rubs, or gallops. Abdomen:  Soft, nontender, minimally distended.  Without guarding or rebound. Extremities: Without cyanosis, clubbing, edema, or  obvious deformity. Vascular:  Dorsalis pedis and posterior tibial pulses palpable bilaterally. Skin:  Warm and dry, profoundly jaundiced  Data Reviewed: I have personally reviewed following labs and imaging studies  CBC: Recent Labs  Lab 03/06/2021 1113 03/09/21 0317 03/31/2021 1554 03/25/2021 0455  WBC 10.4 9.4  --  14.6*  NEUTROABS 8.8*  --   --   --   HGB 10.4* 9.8* 9.9* 8.6*  HCT 31.3* 29.4* 29.0* 27.6*  MCV 86.7 87.8  --  92.3  PLT 282 249  --  106   Basic Metabolic Panel: Recent Labs  Lab 03/09/21 0926 03/10/21 0424 03/11/21 0505 03/12/21 0502 03/12/21 1023 03/13/21 0157 03/13/21 0540 03/19/2021 0438 04/01/2021 1554 03/21/2021 0455  NA  --  132* 137 131*  --  129*  --  131* 136 135  K  --  4.9 5.2* 6.0*   < > 4.9 5.1 5.1 5.5* 5.6*  CL  --  105 112* 108  --  104  --  110  --  106  CO2  --  18* 17* 13*  --  16*  --  14*  --  19*  GLUCOSE  --  192* 178* 222*  --  221*  --  203*  --  216*  BUN  --  48* 41* 43*  --  44*  --  48*  --  56*  CREATININE  --  1.24* 1.09* 1.09*  --  1.43*  --  1.52*  --  2.28*  CALCIUM  --  8.7* 8.8*  8.4*  --  8.6*  --  8.5*  --  8.4*  MG 1.8 2.3 2.1  --   --  1.9  --   --   --   --   PHOS 2.7 2.8  --   --   --  3.6  --   --   --   --    < > = values in this interval not displayed.   GFR: Estimated Creatinine Clearance: 25.2 mL/min (A) (by C-G formula based on SCr of 2.28 mg/dL (H)). Liver Function Tests: Recent Labs  Lab 03/11/21 0505 03/12/21 0502 03/13/21 0157 03/23/2021 0438 03/17/2021 0455  AST 436* 353* 321* 268* 238*  ALT 338* 293* 279* 243* 205*  ALKPHOS 1,710* 1,615* 1,500* 1,633* 1,605*  BILITOT 13.3* 14.2* 15.7* 16.7* 20.0*  PROT 6.0* 5.2* 6.1* 5.8* 5.8*  ALBUMIN 2.1* 1.7* 2.0* 1.8* 2.0*   Recent Labs  Lab 02/27/2021 1113  LIPASE 182*   No results for input(s): AMMONIA in the last 168 hours. Coagulation Profile: Recent Labs  Lab 03/11/21 0505 03/12/21 1023 03/13/21 0157 04/09/2021 0438 03/28/2021 0455  INR 3.2* 1.9* 1.2 1.2  1.1   Cardiac Enzymes: No results for input(s): CKTOTAL, CKMB, CKMBINDEX, TROPONINI in the last 168 hours. BNP (last 3 results) No results for input(s): PROBNP in the last 8760 hours. HbA1C: No results for input(s): HGBA1C in the last 72 hours. CBG: Recent Labs  Lab 03/13/21 2057 03/20/2021 0746 03/16/2021 1151 03/17/2021 2005 03/27/2021 2227  GLUCAP 198* 204* 185* 217* 206*   Lipid Profile: No results for input(s): CHOL, HDL, LDLCALC, TRIG, CHOLHDL, LDLDIRECT in the last 72 hours. Thyroid Function Tests: No results for input(s): TSH, T4TOTAL, FREET4, T3FREE, THYROIDAB in the last 72 hours. Anemia Panel: No results for input(s): VITAMINB12, FOLATE, FERRITIN, TIBC, IRON, RETICCTPCT in the last 72 hours. Sepsis Labs: Recent Labs  Lab 02/24/2021 1113 02/11/2021 1807  LATICACIDVEN 0.8 1.0    Recent Results (from the past 240 hour(s))  Resp Panel by RT-PCR (Flu A&B, Covid) Nasopharyngeal Swab     Status: None   Collection Time: 02/11/2021 11:40 AM   Specimen: Nasopharyngeal Swab; Nasopharyngeal(NP) swabs in vial transport medium  Result Value Ref Range Status   SARS Coronavirus 2 by RT PCR NEGATIVE NEGATIVE Final    Comment: (NOTE) SARS-CoV-2 target nucleic acids are NOT DETECTED.  The SARS-CoV-2 RNA is generally detectable in upper respiratory specimens during the acute phase of infection. The lowest concentration of SARS-CoV-2 viral copies this assay can detect is 138 copies/mL. A negative result does not preclude SARS-Cov-2 infection and should not be used as the sole basis for treatment or other patient management decisions. A negative result may occur with  improper specimen collection/handling, submission of specimen other than nasopharyngeal swab, presence of viral mutation(s) within the areas targeted by this assay, and inadequate number of viral copies(<138 copies/mL). A negative result must be combined with clinical observations, patient history, and  epidemiological information. The expected result is Negative.  Fact Sheet for Patients:  EntrepreneurPulse.com.au  Fact Sheet for Healthcare Providers:  IncredibleEmployment.be  This test is no t yet approved or cleared by the [place] FDA and  has been authorized for detection and/or diagnosis of SARS-CoV-2 by FDA under an Emergency Use Authorization (EUA). This EUA will remain  in effect (meaning this test can be used) for the duration of the COVID-19 declaration under Section 564(b)(1) of the Act, 21 U.S.C.section 360bbb-3(b)(1), unless the authorization is terminated  or revoked sooner.  Influenza A by PCR NEGATIVE NEGATIVE Final   Influenza B by PCR NEGATIVE NEGATIVE Final    Comment: (NOTE) The Xpert Xpress SARS-CoV-2/FLU/RSV plus assay is intended as an aid in the diagnosis of influenza from Nasopharyngeal swab specimens and should not be used as a sole basis for treatment. Nasal washings and aspirates are unacceptable for Xpert Xpress SARS-CoV-2/FLU/RSV testing.  Fact Sheet for Patients: EntrepreneurPulse.com.au  Fact Sheet for Healthcare Providers: IncredibleEmployment.be  This test is not yet approved or cleared by the [place] FDA and has been authorized for detection and/or diagnosis of SARS-CoV-2 by FDA under an Emergency Use Authorization (EUA). This EUA will remain in effect (meaning this test can be used) for the duration of the COVID-19 declaration under Section 564(b)(1) of the Act, 21 U.S.C. section 360bbb-3(b)(1), unless the authorization is terminated or revoked.  Performed at [facility], [address] [place], [place] [address]          Radiology Studies: DG CHEST PORT 1 VIEW  Result Date: 04/02/2021 CLINICAL DATA:  70 year old female with history of wheezing. EXAM: PORTABLE CHEST 1 VIEW COMPARISON:  Chest x-ray 03/13/2021. FINDINGS:  Lung volumes are low. Numerous pulmonary nodules are again noted scattered throughout the lungs bilaterally. However, today's study demonstrates more ill-defined opacities and areas of interstitial prominence. New blunting of the left costophrenic sulcus indicative of a new small left pleural effusion. No definite right pleural effusion. Heart size appears normal. The patient is rotated to the right on today's exam, resulting in distortion of the mediastinal contours and reduced diagnostic sensitivity and specificity for mediastinal pathology. Aortic atherosclerosis. IMPRESSION: 1. Widespread metastatic disease to the lungs redemonstrated with new small left pleural effusion. 2. Interval development of ill-defined opacities in the lungs and areas of interstitial prominence. Given the widespread metastatic disease, this may simply reflect developing lymphangitic spread. Alternatively, noncardiogenic pulmonary edema or multifocal atypical infection could be considered. Electronically Signed   By: Vinnie Langton M.D.   On: 04/06/2021 21:07   DG CHEST PORT 1 VIEW  Result Date: 03/13/2021 CLINICAL DATA:  Pancreatic carcinoma EXAM: PORTABLE CHEST 1 VIEW COMPARISON:  Chest CT March 08, 2021 FINDINGS: Multiple nodular opacities throughout the lungs consistent with metastatic disease, similar to recent CT examination. No edema or airspace opacity. Heart is upper normal in size with pulmonary vascularity normal. Adenopathy in the mediastinum better appreciated by CT but suggested by radiography. No bone lesions. IMPRESSION: Findings indicative of metastatic disease, similar to recent CT. No edema or airspace opacity. Heart upper normal in size. Electronically Signed   By: Lowella Grip III M.D.   On: 03/13/2021 11:41   DG ERCP BILIARY & PANCREATIC DUCTS  Result Date: 03/20/2021 CLINICAL DATA:  Pancreatic mass with liver lesions on MRI. EXAM: ERCP TECHNIQUE: Multiple spot images obtained with the fluoroscopic  device and submitted for interpretation post-procedure. COMPARISON:  MRCP 03/09/2021 FINDINGS: A series of fluoroscopic spot images are submitted documenting endoscopic cannulation with only small volume contrast administration limiting ductal evaluation. IMPRESSION: Limited study with scant contrast administration. These images were submitted for radiologic interpretation only. Please see the procedural report for the amount of contrast and the fluoroscopy time utilized. Electronically Signed   By: Lucrezia Europe M.D.   On: 04/09/2021 16:58        Scheduled Meds: . albuterol  2.5 mg Nebulization BID  . B-complex with vitamin C  1 tablet Oral Daily  . budesonide  0.25 mg Nebulization BID  . calcium carbonate  500 mg of elemental calcium Oral Q breakfast  . diltiazem  90 mg Oral Q6H  . fluticasone  1 spray Each Nare BID  . glipiZIDE  2.5 mg Oral QAC breakfast  . guaiFENesin  600 mg Oral BID  . insulin aspart  0-15 Units Subcutaneous TID WC  . insulin aspart  0-5 Units Subcutaneous QHS  . insulin glargine  12 Units Subcutaneous Daily  . loratadine  10 mg Oral Daily  . metoprolol tartrate  100 mg Oral BID  . montelukast  10 mg Oral Daily  . multivitamin with minerals  1 tablet Oral Daily  . pantoprazole  40 mg Oral Daily  . sodium chloride flush  3 mL Intravenous Q12H  . [START ON 03/26/2021] Vitamin D (Ergocalciferol)  50,000 Units Oral Q14 Days  . vitamin E  100 Units Oral Daily   Continuous Infusions: . sodium chloride    .  sodium bicarbonate (isotonic) infusion in sterile water 100 mL/hr at 03/19/2021 2219     LOS: 7 days   Time spent: 36mn  Lional Icenogle C Marlinda Miranda, DO [facility]  If 7PM-7AM, please contact night-coverage [email or web address]  04/07/2021, 7:09 AM

## 2021-03-15 NOTE — Anesthesia Procedure Notes (Signed)
Procedure Name: Intubation Date/Time: 04/08/2021 1:31 PM Performed by: Eben Burow, CRNA Pre-anesthesia Checklist: Patient identified, Emergency Drugs available, Suction available, Patient being monitored and Timeout performed Patient Re-evaluated:Patient Re-evaluated prior to induction Oxygen Delivery Method: Circle system utilized Preoxygenation: Pre-oxygenation with 100% oxygen Induction Type: IV induction and Rapid sequence Laryngoscope Size: Mac and 4 Grade View: Grade I Tube type: Oral Tube size: 7.0 mm Number of attempts: 1 Airway Equipment and Method: Stylet Placement Confirmation: ETT inserted through vocal cords under direct vision,  positive ETCO2 and breath sounds checked- equal and bilateral Secured at: 21 cm Tube secured with: Tape Dental Injury: Teeth and Oropharynx as per pre-operative assessment

## 2021-03-15 NOTE — Anesthesia Postprocedure Evaluation (Signed)
Anesthesia Post Note  Patient: Laura Sherman  Procedure(s) Performed: ENDOSCOPIC RETROGRADE CHOLANGIOPANCREATOGRAPHY (ERCP) WITH PROPOFOL (N/A ) SPHINCTEROTOMY BILIARY STENT PLACEMENT (N/A )     Patient location during evaluation: PACU Anesthesia Type: General Level of consciousness: awake and alert Pain management: pain level controlled Vital Signs Assessment: post-procedure vital signs reviewed and stable Respiratory status: respiratory function stable Cardiovascular status: blood pressure returned to baseline and stable Postop Assessment: no apparent nausea or vomiting Anesthetic complications: no Comments: Pt extubated to BiPAP. Continues to require vasopressor support. Administering additional albumin. Recommend ICU for close monitoring and management.    No complications documented.  Last Vitals:  Vitals:   03/31/2021 1520 03/30/2021 1526  BP: (!) 112/49 (!) 116/49  Pulse: 72 75  Resp: (!) 23 (!) 22  Temp:    SpO2: 91% 92%    Last Pain:  Vitals:   03/21/2021 1452  TempSrc: Axillary  PainSc: Asleep                 Effie Berkshire

## 2021-03-16 ENCOUNTER — Encounter (HOSPITAL_COMMUNITY): Payer: Self-pay | Admitting: Gastroenterology

## 2021-03-16 DIAGNOSIS — R579 Shock, unspecified: Secondary | ICD-10-CM | POA: Diagnosis not present

## 2021-03-16 DIAGNOSIS — C801 Malignant (primary) neoplasm, unspecified: Secondary | ICD-10-CM | POA: Diagnosis not present

## 2021-03-16 DIAGNOSIS — E875 Hyperkalemia: Secondary | ICD-10-CM

## 2021-03-16 DIAGNOSIS — K831 Obstruction of bile duct: Secondary | ICD-10-CM | POA: Diagnosis not present

## 2021-03-16 DIAGNOSIS — E872 Acidosis, unspecified: Secondary | ICD-10-CM

## 2021-03-16 LAB — COMPREHENSIVE METABOLIC PANEL
ALT: 173 U/L — ABNORMAL HIGH (ref 0–44)
ALT: 159 U/L — ABNORMAL HIGH (ref 0–44)
AST: 143 U/L — ABNORMAL HIGH (ref 15–41)
AST: 113 U/L — ABNORMAL HIGH (ref 15–41)
Albumin: 2.2 g/dL — ABNORMAL LOW (ref 3.5–5.0)
Albumin: 2.2 g/dL — ABNORMAL LOW (ref 3.5–5.0)
Alkaline Phosphatase: 1405 U/L — ABNORMAL HIGH (ref 38–126)
Alkaline Phosphatase: 1337 U/L — ABNORMAL HIGH (ref 38–126)
Anion gap: 12 (ref 5–15)
Anion gap: 11 (ref 5–15)
BUN: 69 mg/dL — ABNORMAL HIGH (ref 8–23)
BUN: 78 mg/dL — ABNORMAL HIGH (ref 8–23)
CO2: 16 mmol/L — ABNORMAL LOW (ref 22–32)
CO2: 16 mmol/L — ABNORMAL LOW (ref 22–32)
Calcium: 8.2 mg/dL — ABNORMAL LOW (ref 8.9–10.3)
Calcium: 8.2 mg/dL — ABNORMAL LOW (ref 8.9–10.3)
Chloride: 110 mmol/L (ref 98–111)
Chloride: 109 mmol/L (ref 98–111)
Creatinine, Ser: 2.92 mg/dL — ABNORMAL HIGH (ref 0.44–1.00)
Creatinine, Ser: 3.56 mg/dL — ABNORMAL HIGH (ref 0.44–1.00)
GFR, Estimated: 17 mL/min — ABNORMAL LOW (ref >60)
GFR, Estimated: 13 mL/min — ABNORMAL LOW (ref >60)
Glucose, Bld: 228 mg/dL — ABNORMAL HIGH (ref 70–99)
Glucose, Bld: 203 mg/dL — ABNORMAL HIGH (ref 70–99)
Potassium: 5.8 mmol/L — ABNORMAL HIGH (ref 3.5–5.1)
Potassium: 5.4 mmol/L — ABNORMAL HIGH (ref 3.5–5.1)
Sodium: 138 mmol/L (ref 135–145)
Sodium: 136 mmol/L (ref 135–145)
Total Bilirubin: 16.1 mg/dL — ABNORMAL HIGH (ref 0.3–1.2)
Total Bilirubin: 12.8 mg/dL — ABNORMAL HIGH (ref 0.3–1.2)
Total Protein: 5.7 g/dL — ABNORMAL LOW (ref 6.5–8.1)
Total Protein: 6 g/dL — ABNORMAL LOW (ref 6.5–8.1)

## 2021-03-16 LAB — CBC
HCT: 25.3 % — ABNORMAL LOW (ref 36.0–46.0)
Hemoglobin: 7.7 g/dL — ABNORMAL LOW (ref 12.0–15.0)
MCH: 29.3 pg (ref 26.0–34.0)
MCHC: 30.4 g/dL (ref 30.0–36.0)
MCV: 96.2 fL (ref 80.0–100.0)
Platelets: 242 10*3/uL (ref 150–400)
RBC: 2.63 MIL/uL — ABNORMAL LOW (ref 3.87–5.11)
RDW: 18.6 % — ABNORMAL HIGH (ref 11.5–15.5)
WBC: 15.7 10*3/uL — ABNORMAL HIGH (ref 4.0–10.5)
nRBC: 0 % (ref 0.0–0.2)

## 2021-03-16 LAB — BLOOD GAS, ARTERIAL
Acid-base deficit: 10.3 mmol/L — ABNORMAL HIGH (ref 0.0–2.0)
Acid-base deficit: 10.3 mmol/L — ABNORMAL HIGH (ref 0.0–2.0)
Bicarbonate: 16.4 mmol/L — ABNORMAL LOW (ref 20.0–28.0)
Bicarbonate: 16.5 mmol/L — ABNORMAL LOW (ref 20.0–28.0)
Drawn by: 331471
FIO2: 28
FIO2: 28
O2 Saturation: 93.7 %
O2 Saturation: 94.9 %
Patient temperature: 98.6
Patient temperature: 98.6
pCO2 arterial: 43.1 mmHg (ref 32.0–48.0)
pCO2 arterial: 43.5 mmHg (ref 32.0–48.0)
pH, Arterial: 7.203 — ABNORMAL LOW (ref 7.350–7.450)
pH, Arterial: 7.205 — ABNORMAL LOW (ref 7.350–7.450)
pO2, Arterial: 76.2 mmHg — ABNORMAL LOW (ref 83.0–108.0)
pO2, Arterial: 82.2 mmHg — ABNORMAL LOW (ref 83.0–108.0)

## 2021-03-16 LAB — LIPASE, BLOOD: Lipase: 51 U/L (ref 11–51)

## 2021-03-16 LAB — GLUCOSE, CAPILLARY
Glucose-Capillary: 213 mg/dL — ABNORMAL HIGH (ref 70–99)
Glucose-Capillary: 220 mg/dL — ABNORMAL HIGH (ref 70–99)
Glucose-Capillary: 195 mg/dL — ABNORMAL HIGH (ref 70–99)
Glucose-Capillary: 184 mg/dL — ABNORMAL HIGH (ref 70–99)

## 2021-03-16 LAB — PROTIME-INR
INR: 1.2 (ref 0.8–1.2)
INR: 1.2 (ref 0.8–1.2)
Prothrombin Time: 15.6 seconds — ABNORMAL HIGH (ref 11.4–15.2)
Prothrombin Time: 15 seconds (ref 11.4–15.2)

## 2021-03-16 MED ORDER — SODIUM ZIRCONIUM CYCLOSILICATE 5 G PO PACK
5.0000 g | PACK | Freq: Once | ORAL | Status: AC
  Administered 2021-03-16: 5 g via ORAL
  Filled 2021-03-16: qty 1

## 2021-03-16 MED ORDER — SODIUM BICARBONATE 8.4 % IV SOLN
INTRAVENOUS | Status: DC
  Filled 2021-03-16: qty 1000

## 2021-03-16 MED ORDER — FUROSEMIDE 10 MG/ML IJ SOLN
40.0000 mg | Freq: Two times a day (BID) | INTRAMUSCULAR | Status: DC
  Administered 2021-03-16: 40 mg via INTRAVENOUS
  Filled 2021-03-16: qty 4

## 2021-03-16 MED ORDER — STERILE WATER FOR INJECTION IV SOLN
INTRAVENOUS | Status: DC
  Filled 2021-03-16: qty 1000
  Filled 2021-03-16 (×2): qty 150
  Filled 2021-03-16: qty 1000

## 2021-03-16 MED ORDER — BUDESONIDE 0.25 MG/2ML IN SUSP
0.5000 mg | Freq: Two times a day (BID) | RESPIRATORY_TRACT | Status: DC
  Administered 2021-03-16 – 2021-03-20 (×7): 0.5 mg via RESPIRATORY_TRACT
  Filled 2021-03-16 (×9): qty 4

## 2021-03-16 MED ORDER — LACTATED RINGERS IV BOLUS
1000.0000 mL | Freq: Once | INTRAVENOUS | Status: AC
  Administered 2021-03-16: 1000 mL via INTRAVENOUS

## 2021-03-16 NOTE — Progress Notes (Signed)
Due to hypotension, patient's antihypertensive medications are being held today per Dr. Justus Memory verbal order.This RN will continue to carefully monitor pt.

## 2021-03-16 NOTE — Progress Notes (Signed)
Physical Therapy Treatment Patient Details Name: Laura Sherman MRN: [id number] DOB: Jan 18, 1951 Today's Date: 03/16/2021    History of Present Illness 70 year old woman PMH breast cancer, uterine cancer, diabetes mellitus type 2, hypertension presented to the emergency department with 1 week history of failure to thrive, anorexia, fatigue.  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  Seen by gastroenterology with plans for ERCP and EUS, delayed by coagulopathy.  Attempted ERCP on 04/01/21 was unsuccessful, repeat ERCP with stent placement successful on 03/13/2021 with moderate hypotension and respiratory distress transferred to the ICU for closer monitoring.    PT Comments    RN sent secure chat to see pt this afternoon as pt was a cancel this morning.  Pt seen in step down room 1232.  Daughter present during session and very helpful as she is a Marine scientist.  General Comments: AxO x 3 groggy, following commands, requesting ice chips.  Vitals prior to activity are stable BP 11/59(78), HR 91, sats 97% 2 Lts and RR 24. B LE sensory and MMT performed.  Pt present with severe edema. Pt unable to lift legs against gravity.  R LE 2/5 and L LE 1/5 strength. B UE's also present with severe edema.  Pt unable to hold a cup.  RN and NT in room to assist Therapist  Transition pt to seated position EOB.  General bed mobility comments: pt required Total Assist +2 to transition from supine to EOB. Unable to functionally use B UE's and only trace active mvmt B LE due to gross weakness and systemic edema/heaviness.  Pt sat EOB approx 6 min with Total Posterior support present with MAX posterior lean also inhibited by enlarged ABD girth.  Pt slowly sliding OOB required + 2 Total Assist to scoot back but then B feet off floor due to short leg length.  Pt remained groggy but did c/o dizziness with position change.  Vitals maintaining with soft BP 11/59, HR 91  and RA 94%.  RR did increase from 24 to 33 with slight increased WOB due to seated position/pressure on her diaphram.  NT and RN assisted.  + 3 Total Assist to transfer pt back to supine and scoot to [facility]. Will continue to follow during her acute care stay to increase her mobility and strength.   Follow Up Recommendations  SNF     Equipment Recommendations       Recommendations for Other Services       Precautions / Restrictions Precautions Precautions: Fall    Mobility  Bed Mobility Overal bed mobility: Needs Assistance Bed Mobility: Supine to Sit;Sit to Supine     Supine to sit: +2 for physical assistance;+2 for safety/equipment;Total assist Sit to supine: Total assist;+2 for physical assistance;+2 for safety/equipment   General bed mobility comments: pt required Total Assist +2 to transition from supine to EOB. Unable to functionally use B UE's and only trace active mvmt B LE due to gross weakness and systemic edema/heaviness.  Pt sat EOB approx 6 min with Total Posterior support present with MAX posterior lean also inhibited by enlarged ABD girth.  Pt slowly sliding OOB required + 2 Total Assist to scoot back but then B feet off floor due to short leg length.  Pt remained groggy but did c/o dizziness with position change.  Vitals maintaining with soft BP 11/59, HR 91 and RA 94%.  RR did increase from 24 to 33 with slight increased WOB due to seated position/pressure  on her diaphram.  NT and RN assisted.  + 3 Total Assist to transfer pt back to supine and scoot to [facility].    Transfers                 General transfer comment: unable to attempt any OOB activity due to poor performance level EOB.  [place] for any OOB transfers at this time.  Ambulation/Gait             General Gait Details: non amb at this time   Stairs             Wheelchair Mobility    Modified Rankin (Stroke Patients Only)       Balance                                             Cognition Arousal/Alertness: Awake/alert Behavior During Therapy: Flat affect Overall Cognitive Status: Within Functional Limits for tasks assessed                                 General Comments: AxO x 3 groggy, following commands, requesting ice chips      Exercises      General Comments        Pertinent Vitals/Pain Pain Assessment: Faces Faces Pain Scale: Hurts a little bit Pain Location: general and "knees" arthritis Pain Descriptors / Indicators: Grimacing Pain Intervention(s): Monitored during session    Home Living                      Prior Function            PT Goals (current goals can now be found in the care plan section) Progress towards PT goals: Progressing toward goals    Frequency    Min 3X/week      PT Plan Current plan remains appropriate    Co-evaluation              AM-PAC PT "6 Clicks" Mobility   Outcome Measure  Help needed turning from your back to your side while in a flat bed without using bedrails?: Total Help needed moving from lying on your back to sitting on the side of a flat bed without using bedrails?: Total Help needed moving to and from a bed to a chair (including a wheelchair)?: Total Help needed standing up from a chair using your arms (e.g., wheelchair or bedside chair)?: Total Help needed to walk in hospital room?: Total Help needed climbing 3-5 steps with a railing? : Total 6 Click Score: 6    End of Session Equipment Utilized During Treatment: Gait belt Activity Tolerance: Treatment limited secondary to medical complications (Comment) Patient left: in bed;with family/visitor present;with nursing/sitter in room;with call bell/phone within reach Nurse Communication: Mobility status PT Visit Diagnosis: Difficulty in walking, not elsewhere classified (R26.2);Muscle weakness (generalized) (M62.81);Unsteadiness on feet (R26.81)     Time: 3875-6433 PT Time Calculation  (min) (ACUTE ONLY): 25 min  Charges:  $Therapeutic Activity: 23-37 mins                     Rica Koyanagi  PTA Acute  Rehabilitation Services Pager      [phone] Office      [phone]

## 2021-03-16 NOTE — Progress Notes (Signed)
PT Cancellation Note  Patient Details Name: Laura Sherman MRN: [id number] DOB: [birth year]   Cancelled Treatment:     Pt unable to tolerate today, had EGD yesterday then transferred to step down and currently on Vasopressors.  Will attempt to see another day as schedule and medical permits.     Nathanial Rancher 03/16/2021, 9:45 AM

## 2021-03-16 NOTE — Progress Notes (Addendum)
Evening rounds lab review  Low off pressors.  She continues to be acidotic.  But pH is worse.  Creatinine is worse potassium is high.  Assessment - Metastatic adenocarcinoma - Poor prognosis - Renal failure - Acidosis  Plan - Informed [facility] hospitalist [patient was DNR but at the time of admission she reported herself to full code] -We will give fluid bolus - Start bicarbonate infusion - Not a good candidate for CRRT -Make patient n.p.o.  CCM will round on 03/17/2021.  Discussed with Dr. Lajean Saver    Dr. Brand Males, M.D., F.C.C.P,  Pulmonary and Critical Care Medicine Staff Physician, [place] Director - Interstitial Lung Disease  Program  [facility] [place], [place], [address]  Pager: [phone], If no answer  OR between  19:00-7:00h: page [phone] Telephone (clinical office): [phone] Telephone (research): [phone]  6:46 PM 03/16/2021    LABS    PULMONARY Recent Labs  Lab 04/01/2021 1554 04/08/2021 2043 03/27/2021 0030 03/30/2021 0524 03/16/21 1642  PHART 7.126*  --  7.226*  --  7.203*  PCO2ART 48.1*  --  43.8  --  43.5  PO2ART 100  --  78.3*  --  82.2*  HCO3 15.8* 12.8* 17.7* 17.6* 16.5*  TCO2 17*  --   --   --   --   O2SAT 95.0 98.3 95.1 93.3 94.9    CBC Recent Labs  Lab 03/25/2021 0455 04/01/2021 2110 03/16/21 1642  HGB 8.6* 7.9* 7.7*  HCT 27.6* 25.9* 25.3*  WBC 14.6* 19.0* 15.7*  PLT 267 278 242    COAGULATION Recent Labs  Lab 03/13/21 0157 03/26/2021 0438 03/26/2021 0455 03/16/21 0300 03/16/21 1642  INR 1.2 1.2 1.1 1.2 1.2    CARDIAC  No results for input(s): TROPONINI in the last 168 hours. No results for input(s): PROBNP in the last 168 hours.   CHEMISTRY Recent Labs  Lab 03/10/21 0424 03/11/21 0505 03/12/21 0502 03/13/21 0157 03/13/21 0540 04/04/2021 0438 03/21/2021 1554 03/11/2021 0455 03/16/21 0300 03/16/21 1642  NA 132*  137   < > 129*  --  131* 136 135 138 136  K 4.9 5.2*   < > 4.9   < > 5.1 5.5* 5.6* 5.8* 5.4*  CL 105 112*   < > 104  --  110  --  106 110 109  CO2 18* 17*   < > 16*  --  14*  --  19* 16* 16*  GLUCOSE 192* 178*   < > 221*  --  203*  --  216* 228* 203*  BUN 48* 41*   < > 44*  --  48*  --  56* 69* 78*  CREATININE 1.24* 1.09*   < > 1.43*  --  1.52*  --  2.28* 2.92* 3.56*  CALCIUM 8.7* 8.8*   < > 8.6*  --  8.5*  --  8.4* 8.2* 8.2*  MG 2.3 2.1  --  1.9  --   --   --   --   --   --   PHOS 2.8  --   --  3.6  --   --   --   --   --   --    < > = values in this interval not displayed.   Estimated Creatinine Clearance: 16.2 mL/min (A) (by C-G formula based on SCr of 3.56 mg/dL (H)).   LIVER  Recent Labs  Lab 03/13/21 0157 04/10/2021 0438 03/28/2021 0455 03/16/21 0300 03/16/21 1642  AST 321* 268* 238* 143* 113*  ALT 279* 243* 205* 173* 159*  ALKPHOS 1,500* 1,633* 1,605* 1,405* 1,337*  BILITOT 15.7* 16.7* 20.0* 16.1* 12.8*  PROT 6.1* 5.8* 5.8* 5.7* 6.0*  ALBUMIN 2.0* 1.8* 2.0* 2.2* 2.2*  INR 1.2 1.2 1.1 1.2 1.2     INFECTIOUS Recent Labs  Lab 03/27/2021 0455 03/21/2021 1714 03/13/2021 2110  LATICACIDVEN  --  0.8 0.7  PROCALCITON 19.15  --   --      ENDOCRINE CBG (last 3)  Recent Labs    03/16/21 0728 03/16/21 1133 03/16/21 1610  GLUCAP 213* 220* 195*         IMAGING x48h  - image(s) personally visualized  -   highlighted in bold DG Chest Port 1 View  Result Date: 03/25/2021 CLINICAL DATA:  CVL placement ERCP EXAM: PORTABLE CHEST 1 VIEW COMPARISON:  03/11/2021, CT 02/22/2021, chest x-ray 03/13/2021 FINDINGS: Right-sided central venous catheter tip over the SVC. No pneumothorax. Multiple bilateral pulmonary nodules consistent with metastatic disease. Stable cardiomediastinal silhouette. Probable vascular congestion. Small left effusion with airspace disease at left lung base. No pneumothorax. IMPRESSION: 1. Right IJ central venous catheter tip over the SVC. No pneumothorax. 2.  Pulmonary metastatic disease. Similar small left effusion and basilar airspace disease. Electronically Signed   By: Donavan Foil M.D.   On: 03/18/2021 17:59   DG CHEST PORT 1 VIEW  Result Date: 04/01/2021 CLINICAL DATA:  70 year old female with history of wheezing. EXAM: PORTABLE CHEST 1 VIEW COMPARISON:  Chest x-ray 03/13/2021. FINDINGS: Lung volumes are low. Numerous pulmonary nodules are again noted scattered throughout the lungs bilaterally. However, today's study demonstrates more ill-defined opacities and areas of interstitial prominence. New blunting of the left costophrenic sulcus indicative of a new small left pleural effusion. No definite right pleural effusion. Heart size appears normal. The patient is rotated to the right on today's exam, resulting in distortion of the mediastinal contours and reduced diagnostic sensitivity and specificity for mediastinal pathology. Aortic atherosclerosis. IMPRESSION: 1. Widespread metastatic disease to the lungs redemonstrated with new small left pleural effusion. 2. Interval development of ill-defined opacities in the lungs and areas of interstitial prominence. Given the widespread metastatic disease, this may simply reflect developing lymphangitic spread. Alternatively, noncardiogenic pulmonary edema or multifocal atypical infection could be considered. Electronically Signed   By: Vinnie Langton M.D.   On: 03/17/2021 21:07   DG ERCP BILIARY & PANCREATIC DUCTS  Result Date: 03/30/2021 CLINICAL DATA:  Pancreatic cancer. EXAM: ERCP TECHNIQUE: Multiple spot images obtained with the fluoroscopic device and submitted for interpretation post-procedure. COMPARISON:  ERCP-03/15/2019; MRCP-03/09/2021 FLUOROSCOPY TIME:  14 minutes, 33 seconds FINDINGS: Multiple spot fluoroscopic images the right upper abdominal quadrant are provided for review. Initial image demonstrates an ERCP probe overlying the right upper abdominal quadrant. Subsequent images demonstrate  opacification of the descending duodenum, ultimately with successful cannulation and opacification of the CBD which appears markedly abnormal with beaded irregularity. There is minimal opacification of the intrahepatic biliary tree which appears at least moderately dilated centrally. Completion image demonstrates placement of an internal biliary stent overlying the expected location of the CBD. IMPRESSION: ERCP with internal biliary stent placement as above. These images were submitted for radiologic interpretation only. Please see the procedural report for the amount of contrast and the fluoroscopy time utilized. Electronically Signed   By: Sandi Mariscal M.D.   On: 03/30/2021 15:05

## 2021-03-16 NOTE — Progress Notes (Signed)
Overnight pt was anuric and bladder scans showed minimal volume; last scan in afternoon read 150 mLs. Dr. Avon Gully notified by this RN after pt had no response to lasix. Per orders, this RN performed an in-and-out cath with an NT-3. Patient's bladder only drained 50 mLs of tea colored/cloudy urine. Dr Avon Gully notified of output as well as CR of 3.56. IV lasix was discontinued by MD, and no UA or cultures were ordered. This RN will continue to carefully monitor pt.

## 2021-03-16 NOTE — Progress Notes (Signed)
[facility] Progress Note  Laura Sherman 70 y.o. Nov 23, 1950  CC:  Pancreatic mass, obstructive jaundice  Subjective: Patient denies any abdominal pain, nausea, or vomiting.   ROS : Review of Systems  Cardiovascular: Negative for chest pain and palpitations.  Gastrointestinal: Negative for abdominal pain, blood in stool, constipation, diarrhea, heartburn, melena, nausea and vomiting.    Objective: Vital signs in last 24 hours: Vitals:   03/16/21 1200 03/16/21 1300  BP: (!) 108/51 (!) 116/51  Pulse: 91 88  Resp: (!) 22 (!) 21  Temp: 98.3 F (36.8 C)   SpO2: 95% 97%    Physical Exam:  General:  Lethargic, oriented, cooperative, no distress, jaundiced  Head:  Normocephalic, without obvious abnormality, atraumatic  Eyes:  Deep icterus, EOMs intact  Lungs:   No respiratory distress  Heart:  Regular rate and rhythm, S1, S2 normal  Abdomen:   Soft, non-tender, non-distended, bowel sounds active all four quadrants  Extremities: Extremities normal, atraumatic, no  edema    Lab Results: Recent Labs    03/13/2021 0455 03/16/21 0300  NA 135 138  K 5.6* 5.8*  CL 106 110  CO2 19* 16*  GLUCOSE 216* 228*  BUN 56* 69*  CREATININE 2.28* 2.92*  CALCIUM 8.4* 8.2*   Recent Labs    03/24/2021 0455 03/16/21 0300  AST 238* 143*  ALT 205* 173*  ALKPHOS 1,605* 1,405*  BILITOT 20.0* 16.1*  PROT 5.8* 5.7*  ALBUMIN 2.0* 2.2*   Recent Labs    03/27/2021 0455 03/18/2021 2110  WBC 14.6* 19.0*  HGB 8.6* 7.9*  HCT 27.6* 25.9*  MCV 92.3 94.5  PLT 267 278   Recent Labs    04/06/2021 0455 03/16/21 0300  LABPROT 14.1 15.6*  INR 1.1 1.2    Assessment: Obstructive jaundice due to pancreatic adenocarcinoma s/p ERCP with CBD stent placement 5/5: -T. Bili 17, improved from 20.0 yesterday -LFTs downtrending: AST 143/ ALT 173/ ALP 1405   Pancreatic adenocarcinoma with pulmonary and liver metastases; EUS 5/4 biopsies confirmed malignant cells consistent with adenocarcinoma    Coagulopathy, likely related to liver metastases, improved: INR 1.2  AKI: Cr 2.92 today  History of breast cancer and uterine adenocarcinoma  Plan: Soft diet OK.  Continue supportive care.  Further management by oncology.  [facility] will sign off. Please contact us if we can be of any further assistance during this hospital stay.  Salley Slaughter PA-C 03/16/2021, 1:18 PM  Contact #  [phone]

## 2021-03-16 NOTE — TOC Progression Note (Signed)
Transition of Care [facility]) - Progression Note    Patient Details  Name: MOLINA HOLLENBACK MRN: [id number] Date of Birth: [birth year]  Transition of Care [facility]) CM/SW Contact  Leeroy Cha, RN Phone Number: 03/16/2021, 8:25 AM  Clinical Narrative:     painless jaundice and weakness.  Found to have a pancreatic mass with metastases to liver.  Clinical picture is worrisome for pancreatic cancer.  Coagulopathy corrected after admission, underwent ERCP today, had a sphincterotomy and biliary stent placed.  AFter the procedure she was placed on Bipap and vasopressors.  She was admitted to the ICU for further evaluation. This afternoon she says that she feels OK, denies pain, is oriented to [facility].  Remains on vasopressors.  PLAN: home with self care once stabilized and pain free  Expected Discharge Plan: [place] Barriers to Discharge: Continued Medical Work up  Expected Discharge Plan and Services Expected Discharge Plan: [place] In-house Referral: Clinical Social Work   Post Acute Care Choice: [place] arrangements for the past 2 months: Single Family Home                 DME Arranged: N/A DME Agency: NA                   Social Determinants of Health (SDOH) Interventions    Readmission Risk Interventions Readmission Risk Prevention Plan 03/12/2021  Transportation Screening Complete  Home Care Screening Complete  Medication Review (RN CM) Complete  Some recent data might be hidden

## 2021-03-16 NOTE — Progress Notes (Addendum)
NAME:  LANETT LASORSA, MRN:  [id number], DOB:  22-Sep-1951, LOS: 8 ADMISSION DATE:  03/02/2021, CONSULTATION DATE:  Mar 16, 2021 REFERRING MD:  Dr. Avon Gully, CHIEF COMPLAINT:  Hypotension   History of Present Illness:  JERRILYNN MIKOWSKI is a 70 y.o. F with PMH significant for uterine cancer, breast cancer, HTN, GERD, type II diabetes, and asthma who presented 4/28 with complaints of failure to thrive, anorexia, and fatigue. ON ED arrival she was seen with AKI, hyperkalemia, and jaundice with recent diagnosis of metastatic liver disease. She was admitted per [facility] with GI consult.   Thus far during hospitalization patient has underwent attempted ERCP 5/4 (initially delayed due to coagulapathy) that was unsuccessful. FNA of liver lesion pathology revealed malignant cells consistent with adenocarcinoma.  Repeat ERCP with stent was completed 5/5. Post Repeat ERCP patient was seen with hypotension requiring pressor support and respiratory distress requiring BIPAP therapy. PCCM consulted for further assistance in management.   Pertinent  Medical History  Uterine cancer, breast cancer, HTN, GERD, type II diabetes, and asthma  Significant Hospital Events: Including procedures, antibiotic start and stop dates in addition to other pertinent events   . 4/28 Admitted with complaints of failure to thrive, anorexia, and fatigue . 5/1 vitamin K given for persistent coagulapathy   . 5/4 Attempted ERCP was unsuccessful  . 5/5 Repeat ERCP with stent was completed 5/5. Post Repeat ERCP patient was seen with hypotension requiring pressor support and respiratory distress requiring BIPAP therapy  Interim History / Subjective:  Pt denies acute complaints  Afebrile  On [place] O2 2L  Glucose range 213-220  I/O 900 ml UOP in last   Objective   Blood pressure (!) 98/52, pulse 88, temperature 97.8 F (36.6 C), temperature source Axillary, resp. rate (!) 23, height 5\' 3"  (1.6 m), weight 98 kg, SpO2 96 %. CVP:  [10 mmHg-11  mmHg] 11 mmHg      Intake/Output Summary (Last 24 hours) at 03/16/2021 1154 Last data filed at 03/16/2021 1107 Gross per 24 hour  Intake 3650.54 ml  Output 0 ml  Net 3650.54 ml   Filed Weights   02/11/2021 1046 03/13/2021 1123  Weight: 98 kg 98 kg    Examination: General: chronically ill appearing adult female lying in bed in NAD, jaundice HEENT: MM pink/moist, IXL O2 2L Neuro: Awakens to voice, speech clear, MAE / generalized weakness  CV: s1s2 RRR, SR 90's, no m/r/g PULM: non-labored on [place] O2, lungs bilaterally clear GI: soft, bsx4 active  Extremities: warm/dry, anasarca  Skin: no rashes or lesions  Labs/imaging that I havepersonally reviewed     Resolved Hospital Problem list     Assessment & Plan:   Obstructive jaundice with CT evidence of pancreatic malignancy s/p ERCP with stent.  FNA of liver with metastatic Adenocarcinoma.  Elevated LFTs Coagulapathy, improved  Vit K administered 5/1, INR 1.1 5/5.  MELD score 28.  Follows with Dr. Dustin Folks in [facility].  -GI following  -monitor for evidence of infectious process post ERCP  -encourage oral hydration  -reduce IVF as below  -will need outpatient ONC follow up  -recommend Palliative Care consult   Hypotension - resolved  Felt secondary to sedation patient required initiation of Neo after paralytics received for intubation for procedure.  R/o SIRS / evolving sepsis.       -tx to SDU status  -reduce IVF to 82ml/hr -if fever, pan culture   Acute Hypoxic Respiratory Failure  Developed post repeat ERCP, felt secondary to sedation.  -  PRN BiPAP if work of breathing  -aspiration precautions  -wean O2 for sats >90% -pulmonary hygiene as able  -minimize sedation   Paroxysmal SVT/PAT Hx of HTN  ECHO with EF 70-75% with grade 1 diastolic dysfunction  -Cardiology following, appreciate evaluation  -tele monitoring  -on beta blocker, CCB -follow daily weights   Acute Kidney Injury  Hyperkalemia  Patient  initally presented with AKI, creatinine 2.38 on admisison. AKI improved with hydration but creatinine again elevated 2.28 5/5. Patient did receive lasix x1 5/4  -Trend BMP / urinary output -Replace electrolytes as indicated -Avoid nephrotoxic agents, ensure adequate renal perfusion -consider reduction of antihypertensives to allow for increased renal perfusion / possible diuresis   DM II  -SSI  -per primary    Best practice   Diet:  NPO while on BIPAP, advance per GI Pain/Anxiety/Delirium protocol (if indicated): No VAP protocol (if indicated): Not indicated DVT prophylaxis: Contraindicated GI prophylaxis: PPI Glucose control:  SSI Yes Central venous access:  Yes, and it is still needed Arterial line:  N/A Foley:  N/A Mobility:  OOB  PT consulted: Yes Last date of multidisciplinary goals of care discussion: Per primary  Code Status:  full code Disposition: SDU   [place] [facility]. PCCM will sign off. Please call back if new needs arise.    Critical care time:    Noe Gens, MSN, APRN, NP-C, AGACNP-BC [facility] 03/16/2021, 11:55 AM   Please see [email or web address] for pager details.   From 7A-7P if no response, please call [phone] After hours, please call ELink [phone]

## 2021-03-16 NOTE — Progress Notes (Addendum)
PROGRESS NOTE    Laura Sherman  DPO:[id number] DOB: [birth year] DOA: 02/13/2021 PCP: Reita Cliche, MD   Brief Narrative:  70 year old woman PMH breast cancer, uterine cancer, diabetes mellitus type 2, hypertension presented to the emergency department with 1 week history of failure to thrive, anorexia, fatigue. 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 Seen by gastroenterology with plans for ERCP and EUS, delayed by coagulopathy.  Attempted ERCP on 04/09/2021 was unsuccessful, repeat ERCP with stent placement successful on 03/30/2021 with moderate hypotension and respiratory distress transferred to the ICU for closer monitoring overnight.  Assessment & Plan:   Principal Problem:   Obstructive jaundice due to malignant neoplasm (HCC) Active Problems:   AKI (acute kidney injury) ([place])   Hyperkalemia   DM type 2 (diabetes mellitus, type 2) (HCC)   Coagulopathy (HCC)   Aortic atherosclerosis (HCC)   Hypercalcemia   Obstructive jaundice, elevated LFTs, abnormal CT and MRI secondary to obstructing adenocarcinoma. - LFTs downtrending.  Jaundice is significant but minimally improving.   - ERCP unsuccessful 03/18/2021, repeat attempt 03/12/2021 successful stent placement -Post procedure hypotension resolved - MELD 29 today -indicating very poor prognosis  Acute hypoxic respiratory failure/hypotension/encephalopathy -Likely in the setting of prolonged procedure and anesthesia -PCCM consulted for assistance with weaning of Neo and BiPAP overnight with moderate success -Hold metoprolol while somewhat hypotensive postprocedure  Pancreatic mass, multiple lung nodules, multiple hepatic lesions consistent with metastatic adenocarcinoma. - ERCP as above successful stent placement 5/5 - Patient wants to follow-up with her oncologist in [facility] Dr. Dustin Folks -contacted by previous physician on 03/12/21 to arrange  close outpatient follow-up. Okay to use right arm for blood draws and IV.  Coagulopathy, most likely secondary to metastatic liver disease - Coagulopathy corrected with vitamin K. - Patient and family understand risk of VTE given malignancy.  Acute kidney injury secondary to poor oral intake/prerenal azotemia Concurrent hyperkalemia - Creatinine continues to rise, likely in setting of volume overload - Lasix 40 IV twice daily x3 doses follow creatinine and urine output - Follow repeat potassium after Lokelma and Lasix given  SVT, resolved - TSH within normal limits. 2D echocardiogram unremarkable.   - Electrolytes stable.   - Continue diltiazem -currently holding metoprolol as above  Diabetes mellitus type 2 with hyperglycemia hemoglobin A1c 8.0 - Continue to hold oral medications. Still hyperglycemic. Will increase Lantus further. Continue sliding scale insulin.  Hyponatremia secondary to poor oral intake, corrected 131 - Resolved.   Aortic atherosclerosis -No treatment indicated  Hypercalcemia, likely secondary to malignancy - Resolved with hydration.  PMH  Endometrial carcinoma status post robotic assisted hysterectomy and bilateral salpingo-oophorectomy in 2014. 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, radiation and chemo post mastectomy ER positive HER-2/neu negative completed antiestrogen therapy  DVT prophylaxis: SCDs only perioperatively Code Status: Full Family Communication: None  Status is: Inpatient  Dispo: The patient is from: Home              Anticipated d/c is to: TBD              Anticipated d/c date is: >72 hours pending clinical course              Patient currently not medically stable for discharge  Consultants:   GI  Cardiology  PCCM  Palliative Care  Procedures/Imaging:   CT chest, abd, pelvis w/ pancreatic mass, hepatic and lung  lesions  concerning for metastatic disease  MRI w/ multiple liver lesions c/w metastatic disease, associated with biliary and pancreatic duct obstruction, lesion in the head of the pancreas, suspected pancreatic adenocarcinoma, multiple pulmonary nodules  Echo LVEF 15-17%, grade 1 diastolic dysfunction  Antimicrobials:  None  Subjective: No acute issues or events overnight patient much more awake, alert and oriented this am.  Objective: Vitals:   03/16/21 0300 03/16/21 0400 03/16/21 0500 03/16/21 0600  BP: 112/75  (!) 117/51 (!) 104/56  Pulse: 85  88 86  Resp: (!) 23  20 17   Temp:  (!) 97.4 F (36.3 C)    TempSrc:  Oral    SpO2: 97%  97% 97%  Weight:      Height:        Intake/Output Summary (Last 24 hours) at 03/16/2021 0708 Last data filed at 03/16/2021 0400 Gross per 24 hour  Intake 2036.26 ml  Output 0 ml  Net 2036.26 ml   Filed Weights   02/28/2021 1046 04/03/2021 1123  Weight: 98 kg 98 kg    Examination:  General:  Pleasantly resting in bed, No acute distress. HEENT:  Normocephalic atraumatic.  Sclerae moderately icteric, noninjected.  Extraocular movements intact bilaterally. Neck:  Without mass or deformity.  Trachea is midline. Lungs:  Clear to auscultate bilaterally without rhonchi, wheeze, or rales. Heart:  Regular rate and rhythm. Without murmurs, rubs, or gallops. Abdomen:  Soft, nontender, minimally distended. Without guarding or rebound. Extremities: Without cyanosis, clubbing, moderate edema 2+ pitting bilateral upper lower extremities. Vascular:  Dorsalis pedis and posterior tibial pulses palpable bilaterally. Skin:  Warm and dry, profoundly jaundiced  Data Reviewed: I have personally reviewed following labs and imaging studies  CBC: Recent Labs  Lab 03/11/2021 1554 04/06/2021 0455 03/26/2021 2110  WBC  --  14.6* 19.0*  HGB 9.9* 8.6* 7.9*  HCT 29.0* 27.6* 25.9*  MCV  --  92.3 94.5  PLT  --  267 616   Basic Metabolic Panel: Recent Labs  Lab  03/09/21 0926 03/10/21 0424 03/10/21 0424 03/11/21 0505 03/12/21 0502 03/12/21 1023 03/13/21 0157 03/13/21 0540 03/20/2021 0438 04/05/2021 1554 03/23/2021 0455 03/16/21 0300  NA  --  132*   < > 137 131*  --  129*  --  131* 136 135 138  K  --  4.9   < > 5.2* 6.0*   < > 4.9 5.1 5.1 5.5* 5.6* 5.8*  CL  --  105   < > 112* 108  --  104  --  110  --  106 110  CO2  --  18*   < > 17* 13*  --  16*  --  14*  --  19* 16*  GLUCOSE  --  192*   < > 178* 222*  --  221*  --  203*  --  216* 228*  BUN  --  48*   < > 41* 43*  --  44*  --  48*  --  56* 69*  CREATININE  --  1.24*   < > 1.09* 1.09*  --  1.43*  --  1.52*  --  2.28* 2.92*  CALCIUM  --  8.7*   < > 8.8* 8.4*  --  8.6*  --  8.5*  --  8.4* 8.2*  MG 1.8 2.3  --  2.1  --   --  1.9  --   --   --   --   --   PHOS 2.7 2.8  --   --   --   --  3.6  --   --   --   --   --    < > = values in this interval not displayed.   GFR: Estimated Creatinine Clearance: 19.7 mL/min (A) (by C-G formula based on SCr of 2.92 mg/dL (H)). Liver Function Tests: Recent Labs  Lab 03/12/21 0502 03/13/21 0157 04/07/2021 0438 04/02/2021 0455 03/16/21 0300  AST 353* 321* 268* 238* 143*  ALT 293* 279* 243* 205* 173*  ALKPHOS 1,615* 1,500* 1,633* 1,605* 1,405*  BILITOT 14.2* 15.7* 16.7* 20.0* 16.1*  PROT 5.2* 6.1* 5.8* 5.8* 5.7*  ALBUMIN 1.7* 2.0* 1.8* 2.0* 2.2*   Recent Labs  Lab 03/26/2021 2110 03/16/21 0300  LIPASE 45 51   No results for input(s): AMMONIA in the last 168 hours. Coagulation Profile: Recent Labs  Lab 03/12/21 1023 03/13/21 0157 03/20/2021 0438 03/16/2021 0455 03/16/21 0300  INR 1.9* 1.2 1.2 1.1 1.2   Cardiac Enzymes: No results for input(s): CKTOTAL, CKMB, CKMBINDEX, TROPONINI in the last 168 hours. BNP (last 3 results) No results for input(s): PROBNP in the last 8760 hours. HbA1C: No results for input(s): HGBA1C in the last 72 hours. CBG: Recent Labs  Lab 03/16/2021 2227 04/08/2021 0800 03/30/2021 1108 03/30/2021 1757 03/27/2021 2139  GLUCAP 206*  218* 219* 231* 229*   Lipid Profile: No results for input(s): CHOL, HDL, LDLCALC, TRIG, CHOLHDL, LDLDIRECT in the last 72 hours. Thyroid Function Tests: No results for input(s): TSH, T4TOTAL, FREET4, T3FREE, THYROIDAB in the last 72 hours. Anemia Panel: No results for input(s): VITAMINB12, FOLATE, FERRITIN, TIBC, IRON, RETICCTPCT in the last 72 hours. Sepsis Labs: Recent Labs  Lab 04/05/2021 0455 03/13/2021 1714 03/31/2021 2110  PROCALCITON 19.15  --   --   LATICACIDVEN  --  0.8 0.7    Recent Results (from the past 240 hour(s))  Resp Panel by RT-PCR (Flu A&B, Covid) Nasopharyngeal Swab     Status: None   Collection Time: 03/02/2021 11:40 AM   Specimen: Nasopharyngeal Swab; Nasopharyngeal(NP) swabs in vial transport medium  Result Value Ref Range Status   SARS Coronavirus 2 by RT PCR NEGATIVE NEGATIVE Final    Comment: (NOTE) SARS-CoV-2 target nucleic acids are NOT DETECTED.  The SARS-CoV-2 RNA is generally detectable in upper respiratory specimens during the acute phase of infection. The lowest concentration of SARS-CoV-2 viral copies this assay can detect is 138 copies/mL. A negative result does not preclude SARS-Cov-2 infection and should not be used as the sole basis for treatment or other patient management decisions. A negative result may occur with  improper specimen collection/handling, submission of specimen other than nasopharyngeal swab, presence of viral mutation(s) within the areas targeted by this assay, and inadequate number of viral copies(<138 copies/mL). A negative result must be combined with clinical observations, patient history, and epidemiological information. The expected result is Negative.  Fact Sheet for Patients:  EntrepreneurPulse.com.au  Fact Sheet for Healthcare Providers:  IncredibleEmployment.be  This test is no t yet approved or cleared by the [place] FDA and  has been authorized for detection and/or  diagnosis of SARS-CoV-2 by FDA under an Emergency Use Authorization (EUA). This EUA will remain  in effect (meaning this test can be used) for the duration of the COVID-19 declaration under Section 564(b)(1) of the Act, 21 U.S.C.section 360bbb-3(b)(1), unless the authorization is terminated  or revoked sooner.       Influenza A by PCR NEGATIVE NEGATIVE Final   Influenza B by PCR NEGATIVE NEGATIVE Final    Comment: (NOTE) The Xpert Xpress  SARS-CoV-2/FLU/RSV plus assay is intended as an aid in the diagnosis of influenza from Nasopharyngeal swab specimens and should not be used as a sole basis for treatment. Nasal washings and aspirates are unacceptable for Xpert Xpress SARS-CoV-2/FLU/RSV testing.  Fact Sheet for Patients: EntrepreneurPulse.com.au  Fact Sheet for Healthcare Providers: IncredibleEmployment.be  This test is not yet approved or cleared by the [place] FDA and has been authorized for detection and/or diagnosis of SARS-CoV-2 by FDA under an Emergency Use Authorization (EUA). This EUA will remain in effect (meaning this test can be used) for the duration of the COVID-19 declaration under Section 564(b)(1) of the Act, 21 U.S.C. section 360bbb-3(b)(1), unless the authorization is terminated or revoked.  Performed at [facility], [address] [place], [place] [address]   MRSA PCR Screening     Status: None   Collection Time: 04/08/2021  4:51 PM   Specimen: Nasal Mucosa; Nasopharyngeal  Result Value Ref Range Status   MRSA by PCR NEGATIVE NEGATIVE Final    Comment:        The GeneXpert MRSA Assay (FDA approved for NASAL specimens only), is one component of a comprehensive MRSA colonization surveillance program. It is not intended to diagnose MRSA infection nor to guide or monitor treatment for MRSA infections. Performed at [facility], [place] [address] [place], [place] [address]      Radiology Studies: DG Chest Port 1 View  Result Date: 03/17/2021 CLINICAL DATA:  CVL placement ERCP EXAM: PORTABLE CHEST 1 VIEW COMPARISON:  03/26/2021, CT 02/24/2021, chest x-ray 03/13/2021 FINDINGS: Right-sided central venous catheter tip over the SVC. No pneumothorax. Multiple bilateral pulmonary nodules consistent with metastatic disease. Stable cardiomediastinal silhouette. Probable vascular congestion. Small left effusion with airspace disease at left lung base. No pneumothorax. IMPRESSION: 1. Right IJ central venous catheter tip over the SVC. No pneumothorax. 2. Pulmonary metastatic disease. Similar small left effusion and basilar airspace disease. Electronically Signed   By: Donavan Foil M.D.   On: 04/09/2021 17:59   DG CHEST PORT 1 VIEW  Result Date: 03/19/2021 CLINICAL DATA:  70 year old female with history of wheezing. EXAM: PORTABLE CHEST 1 VIEW COMPARISON:  Chest x-ray 03/13/2021. FINDINGS: Lung volumes are low. Numerous pulmonary nodules are again noted scattered throughout the lungs bilaterally. However, today's study demonstrates more ill-defined opacities and areas of interstitial prominence. New blunting of the left costophrenic sulcus indicative of a new small left pleural effusion. No definite right pleural effusion. Heart size appears normal. The patient is rotated to the right on today's exam, resulting in distortion of the mediastinal contours and reduced diagnostic sensitivity and specificity for mediastinal pathology. Aortic atherosclerosis. IMPRESSION: 1. Widespread metastatic disease to the lungs redemonstrated with new small left pleural effusion. 2. Interval development of ill-defined opacities in the lungs and areas of interstitial prominence. Given the widespread metastatic disease, this may simply reflect developing lymphangitic spread. Alternatively, noncardiogenic pulmonary edema or multifocal atypical infection could be considered. Electronically Signed   By: Vinnie Langton M.D.   On: 03/21/2021 21:07   DG ERCP BILIARY & PANCREATIC DUCTS  Result Date: 03/19/2021 CLINICAL DATA:  Pancreatic cancer. EXAM: ERCP TECHNIQUE: Multiple spot images obtained with the fluoroscopic device and submitted for interpretation post-procedure. COMPARISON:  ERCP-03/15/2019; MRCP-03/09/2021 FLUOROSCOPY TIME:  14 minutes, 33 seconds FINDINGS: Multiple spot fluoroscopic images the right upper abdominal quadrant are provided for review. Initial image demonstrates an ERCP probe overlying the right upper abdominal quadrant. Subsequent images demonstrate opacification of the descending duodenum, ultimately  with successful cannulation and opacification of the CBD which appears markedly abnormal with beaded irregularity. There is minimal opacification of the intrahepatic biliary tree which appears at least moderately dilated centrally. Completion image demonstrates placement of an internal biliary stent overlying the expected location of the CBD. IMPRESSION: ERCP with internal biliary stent placement as above. These images were submitted for radiologic interpretation only. Please see the procedural report for the amount of contrast and the fluoroscopy time utilized. Electronically Signed   By: Sandi Mariscal M.D.   On: 04/08/2021 15:05   DG ERCP BILIARY & PANCREATIC DUCTS  Result Date: 03/13/2021 CLINICAL DATA:  Pancreatic mass with liver lesions on MRI. EXAM: ERCP TECHNIQUE: Multiple spot images obtained with the fluoroscopic device and submitted for interpretation post-procedure. COMPARISON:  MRCP 03/09/2021 FINDINGS: A series of fluoroscopic spot images are submitted documenting endoscopic cannulation with only small volume contrast administration limiting ductal evaluation. IMPRESSION: Limited study with scant contrast administration. These images were submitted for radiologic interpretation only. Please see the procedural report for the amount of contrast and the fluoroscopy time utilized.  Electronically Signed   By: Lucrezia Europe M.D.   On: 03/29/2021 16:58   Scheduled Meds: . albuterol  2.5 mg Nebulization BID  . B-complex with vitamin C  1 tablet Oral Daily  . budesonide  0.25 mg Nebulization BID  . calcium carbonate  500 mg of elemental calcium Oral Q breakfast  . chlorhexidine  15 mL Mouth Rinse BID  . Chlorhexidine Gluconate Cloth  6 each Topical Daily  . diltiazem  90 mg Oral Q6H  . fluticasone  1 spray Each Nare BID  . guaiFENesin  600 mg Oral BID  . insulin aspart  0-15 Units Subcutaneous TID WC  . insulin aspart  0-5 Units Subcutaneous QHS  . insulin glargine  12 Units Subcutaneous Daily  . loratadine  10 mg Oral Daily  . mouth rinse  15 mL Mouth Rinse q12n4p  . metoprolol tartrate  100 mg Oral BID  . montelukast  10 mg Oral Daily  . multivitamin with minerals  1 tablet Oral Daily  . pantoprazole  40 mg Oral Daily  . sodium chloride flush  10-40 mL Intracatheter Q12H  . sodium chloride flush  3 mL Intravenous Q12H  . [START ON 03/26/2021] Vitamin D (Ergocalciferol)  50,000 Units Oral Q14 Days  . vitamin E  100 Units Oral Daily   Continuous Infusions: . sodium chloride 70 mL/hr at 03/16/21 0349  . ceFEPime (MAXIPIME) IV Stopped (04/05/2021 1853)  . metronidazole Stopped (04/04/2021 2225)  . phenylephrine (NEO-SYNEPHRINE) Adult infusion Stopped (03/16/21 0246)    LOS: 8 days   Time spent: 59mn  Jaysun Wessels C Idalia Allbritton, DO [facility]  If 7PM-7AM, please contact night-coverage [email or web address]  03/16/2021, 7:08 AM

## 2021-03-17 ENCOUNTER — Encounter (HOSPITAL_COMMUNITY): Payer: Self-pay | Admitting: Internal Medicine

## 2021-03-17 DIAGNOSIS — N179 Acute kidney failure, unspecified: Secondary | ICD-10-CM | POA: Diagnosis not present

## 2021-03-17 DIAGNOSIS — C801 Malignant (primary) neoplasm, unspecified: Secondary | ICD-10-CM | POA: Diagnosis not present

## 2021-03-17 DIAGNOSIS — E872 Acidosis: Secondary | ICD-10-CM | POA: Diagnosis not present

## 2021-03-17 DIAGNOSIS — K831 Obstruction of bile duct: Secondary | ICD-10-CM | POA: Diagnosis not present

## 2021-03-17 DIAGNOSIS — Z7189 Other specified counseling: Secondary | ICD-10-CM | POA: Diagnosis not present

## 2021-03-17 LAB — COMPREHENSIVE METABOLIC PANEL
ALT: 132 U/L — ABNORMAL HIGH (ref 0–44)
AST: 78 U/L — ABNORMAL HIGH (ref 15–41)
Albumin: 1.9 g/dL — ABNORMAL LOW (ref 3.5–5.0)
Alkaline Phosphatase: 1133 U/L — ABNORMAL HIGH (ref 38–126)
Anion gap: 11 (ref 5–15)
BUN: 78 mg/dL — ABNORMAL HIGH (ref 8–23)
CO2: 21 mmol/L — ABNORMAL LOW (ref 22–32)
Calcium: 7.6 mg/dL — ABNORMAL LOW (ref 8.9–10.3)
Chloride: 101 mmol/L (ref 98–111)
Creatinine, Ser: 3.71 mg/dL — ABNORMAL HIGH (ref 0.44–1.00)
GFR, Estimated: 12 mL/min — ABNORMAL LOW (ref >60)
Glucose, Bld: 210 mg/dL — ABNORMAL HIGH (ref 70–99)
Potassium: 5.4 mmol/L — ABNORMAL HIGH (ref 3.5–5.1)
Sodium: 133 mmol/L — ABNORMAL LOW (ref 135–145)
Total Bilirubin: 9.2 mg/dL — ABNORMAL HIGH (ref 0.3–1.2)
Total Protein: 5.4 g/dL — ABNORMAL LOW (ref 6.5–8.1)

## 2021-03-17 LAB — CREATININE, URINE, RANDOM: Creatinine, Urine: 88.69 mg/dL

## 2021-03-17 LAB — BLOOD GAS, ARTERIAL
Acid-base deficit: 8.2 mmol/L — ABNORMAL HIGH (ref 0.0–2.0)
Bicarbonate: 17.8 mmol/L — ABNORMAL LOW (ref 20.0–28.0)
Drawn by: 308601
FIO2: 28
O2 Content: 2 L/min
O2 Saturation: 98.3 %
Patient temperature: 98.1
pCO2 arterial: 40.5 mmHg (ref 32.0–48.0)
pH, Arterial: 7.263 — ABNORMAL LOW (ref 7.350–7.450)
pO2, Arterial: 114 mmHg — ABNORMAL HIGH (ref 83.0–108.0)

## 2021-03-17 LAB — CBC
HCT: 24.8 % — ABNORMAL LOW (ref 36.0–46.0)
Hemoglobin: 7.6 g/dL — ABNORMAL LOW (ref 12.0–15.0)
MCH: 29 pg (ref 26.0–34.0)
MCHC: 30.6 g/dL (ref 30.0–36.0)
MCV: 94.7 fL (ref 80.0–100.0)
Platelets: 242 10*3/uL (ref 150–400)
RBC: 2.62 MIL/uL — ABNORMAL LOW (ref 3.87–5.11)
RDW: 18.8 % — ABNORMAL HIGH (ref 11.5–15.5)
WBC: 15.7 10*3/uL — ABNORMAL HIGH (ref 4.0–10.5)
nRBC: 0.1 % (ref 0.0–0.2)

## 2021-03-17 LAB — GLUCOSE, CAPILLARY
Glucose-Capillary: 219 mg/dL — ABNORMAL HIGH (ref 70–99)
Glucose-Capillary: 208 mg/dL — ABNORMAL HIGH (ref 70–99)
Glucose-Capillary: 217 mg/dL — ABNORMAL HIGH (ref 70–99)
Glucose-Capillary: 205 mg/dL — ABNORMAL HIGH (ref 70–99)
Glucose-Capillary: 201 mg/dL — ABNORMAL HIGH (ref 70–99)

## 2021-03-17 LAB — URINALYSIS, ROUTINE W REFLEX MICROSCOPIC
Glucose, UA: 50 mg/dL — AB
Ketones, ur: NEGATIVE mg/dL
Nitrite: NEGATIVE
Protein, ur: 100 mg/dL — AB
Specific Gravity, Urine: 1.016 (ref 1.005–1.030)
pH: 5 (ref 5.0–8.0)

## 2021-03-17 LAB — BASIC METABOLIC PANEL
Anion gap: 11 (ref 5–15)
BUN: 85 mg/dL — ABNORMAL HIGH (ref 8–23)
CO2: 20 mmol/L — ABNORMAL LOW (ref 22–32)
Calcium: 7.6 mg/dL — ABNORMAL LOW (ref 8.9–10.3)
Chloride: 101 mmol/L (ref 98–111)
Creatinine, Ser: 4.16 mg/dL — ABNORMAL HIGH (ref 0.44–1.00)
GFR, Estimated: 11 mL/min — ABNORMAL LOW (ref >60)
Glucose, Bld: 228 mg/dL — ABNORMAL HIGH (ref 70–99)
Potassium: 5.4 mmol/L — ABNORMAL HIGH (ref 3.5–5.1)
Sodium: 132 mmol/L — ABNORMAL LOW (ref 135–145)

## 2021-03-17 LAB — PROTIME-INR
INR: 1.2 (ref 0.8–1.2)
Prothrombin Time: 15.1 seconds (ref 11.4–15.2)

## 2021-03-17 LAB — SODIUM, URINE, RANDOM: Sodium, Ur: 44 mmol/L

## 2021-03-17 LAB — LACTIC ACID, PLASMA: Lactic Acid, Venous: 0.9 mmol/L (ref 0.5–1.9)

## 2021-03-17 MED ORDER — SODIUM ZIRCONIUM CYCLOSILICATE 10 G PO PACK
10.0000 g | PACK | Freq: Once | ORAL | Status: AC
  Administered 2021-03-17: 10 g via ORAL
  Filled 2021-03-17: qty 1

## 2021-03-17 MED ORDER — SODIUM ZIRCONIUM CYCLOSILICATE 10 G PO PACK
10.0000 g | PACK | Freq: Two times a day (BID) | ORAL | Status: AC
  Administered 2021-03-17 – 2021-03-19 (×4): 10 g via ORAL
  Filled 2021-03-17 (×4): qty 1

## 2021-03-17 MED ORDER — MIDODRINE HCL 5 MG PO TABS
10.0000 mg | ORAL_TABLET | Freq: Two times a day (BID) | ORAL | Status: DC
  Administered 2021-03-18 – 2021-03-19 (×3): 10 mg via ORAL
  Filled 2021-03-17 (×4): qty 2

## 2021-03-17 MED ORDER — SODIUM BICARBONATE 8.4 % IV SOLN
50.0000 meq | Freq: Once | INTRAVENOUS | Status: AC
  Administered 2021-03-17: 50 meq via INTRAVENOUS
  Filled 2021-03-17: qty 50

## 2021-03-17 MED ORDER — NOREPINEPHRINE 4 MG/250ML-% IV SOLN
0.0000 ug/min | INTRAVENOUS | Status: DC
Start: 2021-03-17 — End: 2021-03-20
  Administered 2021-03-17: 2 ug/min via INTRAVENOUS
  Filled 2021-03-17: qty 250

## 2021-03-17 NOTE — Progress Notes (Signed)
Pt having runs of SVT twice. EKG done. Dr. Avon Gully aware of EKG & runs of SVT. Monitor pt for symptoms per MD order.

## 2021-03-17 NOTE — Consult Note (Addendum)
Renal Service Consult Note [facility]  Laura Sherman 03/17/2021 Sol Blazing, MD Requesting Physician: Dr. Avon Gully  Reason for Consult: Renal failure HPI: The patient is a 70 y.o. year-old w/ hx of HTN, hx of uterine cancer (2010 hysterectomy, no chemo/ xrt), hx of breast cancer (2005 chemo, XRT), DM2, asthma, DJD presented on 02/25/2021 w/ 1 week hx of FTT, not eating and fatigue, dark urine, fall w/ back and knee pain, pt's skin turning yellow, 15 lb wt loss. CT done and GI consulted w/ suspicion of possible metastatic pancreatic cancer w/ obstructive jaundice. Tbili was 15, high AST/ ALT/ alk phos. Pancreatic mass w/ liver and lung mets by CT.  GI has since done w/u which showed growth within the bile duct and pancreatic head w/ multiple liver mets (and lung mets on imaging). Pt underwent EUS- FNA on 5/04 and on 5/05 he had an ERCP w/ stent placement for relief of biliary obstruction and LFT"s trended down. Pt developed some shock w/ pressor requirement on 5/04- 5/05 and again on 5/06.  Along w/ shock BUN / creat have been climbing and UOP is dropping off significantly. Asked to see for renal failure.   Pt seen in ICU bed. Pt is alert and w/o SOB, chest pain or abd pain.    ROS  denies CP  no joint pain   no HA  no blurry vision  no rash  no diarrhea  no nausea/ vomiting    Past Medical History  Past Medical History:  Diagnosis Date  . Arthritis   . Asthma   . Breast cancer ([place]) 2005   chemo, XRT  . Diabetes mellitus without complication ([place])   . Environmental allergies   . GERD (gastroesophageal reflux disease)   . Hypertension   . Uterine cancer ([place]) 2010   total hysterectomy, no chemo or XRT   Past Surgical History  Past Surgical History:  Procedure Laterality Date  . ABDOMINAL HYSTERECTOMY    . BACK SURGERY    . BILIARY STENT PLACEMENT N/A 03/26/2021   Procedure: BILIARY STENT PLACEMENT;  Surgeon: Clarene Essex, MD;  Location: [facility];   Service: Endoscopy;  Laterality: N/A;  . BREAST SURGERY  2005   chemo, XRT  . ENDOSCOPIC RETROGRADE CHOLANGIOPANCREATOGRAPHY (ERCP) WITH PROPOFOL N/A 04/10/2021   Procedure: ENDOSCOPIC RETROGRADE CHOLANGIOPANCREATOGRAPHY (ERCP) WITH PROPOFOL;  Surgeon: Arta Silence, MD;  Location: [facility];  Service: Endoscopy;  Laterality: N/A;  . ENDOSCOPIC RETROGRADE CHOLANGIOPANCREATOGRAPHY (ERCP) WITH PROPOFOL N/A 03/18/2021   Procedure: ENDOSCOPIC RETROGRADE CHOLANGIOPANCREATOGRAPHY (ERCP) WITH PROPOFOL;  Surgeon: Ronnette Juniper, MD;  Location: [facility];  Service: Gastroenterology;  Laterality: N/A;  . ENDOSCOPIC RETROGRADE CHOLANGIOPANCREATOGRAPHY (ERCP) WITH PROPOFOL N/A 03/19/2021   Procedure: ENDOSCOPIC RETROGRADE CHOLANGIOPANCREATOGRAPHY (ERCP) WITH PROPOFOL;  Surgeon: Clarene Essex, MD;  Location: [facility];  Service: Endoscopy;  Laterality: N/A;  . ESOPHAGOGASTRODUODENOSCOPY (EGD) WITH PROPOFOL N/A 03/11/2021   Procedure: ESOPHAGOGASTRODUODENOSCOPY (EGD) WITH PROPOFOL;  Surgeon: Arta Silence, MD;  Location: [facility];  Service: Endoscopy;  Laterality: N/A;  . EUS N/A 03/11/2021   Procedure: FULL UPPER ENDOSCOPIC ULTRASOUND (EUS) RADIAL;  Surgeon: Arta Silence, MD;  Location: [facility];  Service: Endoscopy;  Laterality: N/A;  . FINE NEEDLE ASPIRATION  03/23/2021   Procedure: FINE NEEDLE ASPIRATION;  Surgeon: Arta Silence, MD;  Location: [facility];  Service: Endoscopy;;  . Joan Mayans  04/02/2021   Procedure: SPHINCTEROTOMY;  Surgeon: Arta Silence, MD;  Location: [facility];  Service: Endoscopy;;  pre-cut  . SPHINCTEROTOMY  03/12/2021  Procedure: SPHINCTEROTOMY;  Surgeon: Clarene Essex, MD;  Location: [facility];  Service: Endoscopy;;  . TONSILLECTOMY     Family History  Family History  Problem Relation Age of Onset  . COPD Mother    Social History  reports that she has never smoked. She has never used smokeless tobacco. She reports that she does not drink alcohol and does not  use drugs. Allergies  Allergies  Allergen Reactions  . Erythromycin Nausea Only    Other Reactions: GI Upset Other Reactions: GI Upset   . Penicillins Rash  . Sulfa Antibiotics Nausea Only    Other Reactions: GI Upset  . Tape Rash    Bandaid   Home medications Prior to Admission medications   Medication Sig Start Date End Date Taking? Authorizing Provider  acetaminophen (TYLENOL) 650 MG CR tablet Take 650 mg by mouth every 8 (eight) hours as needed for pain.   Yes [provider]  albuterol (VENTOLIN HFA) 108 (90 Base) MCG/ACT inhaler Inhale 2 puffs into the lungs every 6 (six) hours as needed for wheezing or shortness of breath.   Yes [provider]  amLODipine (NORVASC) 10 MG tablet Take 10 mg by mouth daily.   Yes [provider]  atorvastatin (LIPITOR) 20 MG tablet Take 20 mg by mouth daily.   Yes [provider]  B Complex Vitamins (VITAMIN B COMPLEX PO) Take 1 tablet by mouth daily.   Yes [provider]  calcium carbonate (OSCAL) 1500 (600 Ca) MG TABS tablet Take 600 mg of elemental calcium by mouth daily.   Yes [provider]  diclofenac (VOLTAREN) 75 MG EC tablet Take 75 mg by mouth 2 (two) times daily.   Yes [provider]  ergocalciferol (VITAMIN D2) 1.25 MG (50000 UT) capsule Take 50,000 Units by mouth every 14 (fourteen) days. 08/12/16  Yes [provider]  fexofenadine (ALLEGRA) 60 MG tablet Take 60 mg by mouth daily.   Yes [provider]  fluticasone (FLONASE) 50 MCG/ACT nasal spray Place 1 spray into both nostrils daily.   Yes [provider]  fluticasone (FLOVENT DISKUS) 50 MCG/BLIST diskus inhaler Inhale 1 puff into the lungs 2 (two) times daily.   Yes [provider]  glipiZIDE (GLUCOTROL) 5 MG tablet Take 2.5 mg by mouth daily. 02/20/21  Yes [provider]  hydrochlorothiazide (HYDRODIURIL) 25 MG tablet Take 25 mg by mouth daily.   Yes [provider]  loratadine (CLARITIN) 10 MG tablet Take 10 mg by mouth daily.   Yes [provider]  losartan-hydrochlorothiazide (HYZAAR) 100-25 MG tablet Take 1 tablet by mouth daily.   Yes [provider]  metFORMIN (GLUCOPHAGE) 500 MG tablet Take by mouth 2 (two) times daily with a meal.   Yes [provider]  metoprolol (LOPRESSOR) 100 MG tablet Take 100 mg by mouth 2 (two) times daily.   Yes [provider]  montelukast (SINGULAIR) 10 MG tablet Take 10 mg by mouth daily. 02/24/21  Yes [provider]  Multiple Vitamin (MULTIVITAMIN WITH MINERALS) TABS tablet Take 1 tablet by mouth daily.   Yes [provider]  olmesartan (BENICAR) 40 MG tablet Take 40 mg by mouth daily. 01/08/21  Yes [provider]  omeprazole (PRILOSEC) 10 MG capsule Take 10 mg by mouth daily.   Yes [provider]  oxymetazoline (AFRIN) 0.05 % nasal spray Place 1 spray into both nostrils daily as needed for congestion.   Yes [provider]  pioglitazone (ACTOS)  30 MG tablet Take 30 mg by mouth daily.   Yes [provider]  Pseudoephedrine-Guaifenesin [phone] MG TB12 Take 1 tablet by mouth 2 (two) times daily.   Yes [provider]  Vitamin E 100 units TABS Take 100 Units by mouth daily.   Yes [provider]     Vitals:   03/17/21 1600 03/17/21 1704 03/17/21 1746 03/17/21 1800  BP: (!) 112/52 (!) 121/56  (!) 108/57  Pulse: (!) 115   83  Resp: 20   (!) 29  Temp:   98.4 F (36.9 C)   TempSrc:   Oral   SpO2: 97%   93%  Weight:      Height:       Exam Gen alert, no distress, elderly WF No rash, cyanosis or gangrene Sclera anicteric, throat clear  No jvd or bruits Chest clear bilat to bases, no rales/ wheezing RRR no MRG Abd soft ntnd no mass or ascites +bs GU deferred MS no joint effusions or deformity Ext diffuse 2-3+ LE > UR edema, no wounds or ulcers Neuro is alert, Ox 3, no asterixis, nonfocal, generally  deconditioned       Home meds:  - norvasc 10/ hctz 25 qd/ losartan-hctz 100-25 qd/ metoprolol 100 bid/ olmesartan 40 qd  - actos 30 qd/ metformin 500 bid ac/ glipizide 2.5 qd  - voltaren 75 bid  - lipitor 20 qd/ prilosec 10 qd  - singulair 10 qd/ flovent diskus bid  - prn's/ vitamins/ supplements    Creat 1.6 on admit 4/29, improved to 1.09 on 5/02 then has worsened daily up to 3.7 yesterday and 4.16 today.     CE - b/l creatinine of 1.00 , eGFR 57 ml/min , on Sep 13, 2020    UA 02/22/2021 - 100 prot, >50 rbc, 0-5 wbc/ epi, few bact, amorphous urates     Na 132  K 5.4  CO2 20  BUN 78  Cr 3.71  egfr 12  Ca 7.6  AG 11  Alkphos 1133      AST 348 >> 78, ALT 338 >> 132, Tbili 15.2 >> 9.2 5/06  eGFR 12 ml/min     WBC 15K  HB 7.6      BP's here generally above 90- 100, was on pressors off and on for 24 hrs around 5/04 - 5/05. then Levo weaned off today at 6 am.      UOP maintained admit through 5/4 then declined the last 48- 72 hrs.      I/O = 10 L in and 3.5 L out = 6.5 L net positive in total    Wt's 98kg on 4/28 and 98 kg on 5/04     No fevers, WBC wnl on admit, climbing the last 3 days 14K, 19K, 15K     IV abx:   - IV cipro on 5/04 - current      - IV flagyl 5/05 - current             - IV cefepime 5/05 - current   Assessment/ Plan: 1. AKI on CKD IIIA - b/l creat 1.0 from Nov 2021, eGFR 57 ml/min. Creat 1.6 on admit in setting of ^LFT"s and biliary obstruction. Underwent EUS then ERCP and had period of shock the last 2- 3 days w/ accompanied dropping UOP. Suspect AKI due to shock, vs ATN from bilirubin nephrotoxicity, vs bladder retention vs other. Place foley, get urine lytes, repeat UA and get renal US. Try midodrine 10  bid to get pressures up. Is significantly vol overloaded but w/o resp issues. DC all IVF"s. No uremic issues at this time. No indication for RRT now, but is at high risk for progression in the next 48 hrs. Plan as above. Will follow.  2. Hyperkalemia - changed to renal diet  and starting lokelma bid until K < 5.  3. Shock - not clear cause, fluids not helping renal failure, will dc. Use pressors as needed.  4. ^WBC - started on IV abx as above 5. Obstructive jaundice w/ pancreatic malignancy by CT- sp ERCP w/ stent and FNA of liver w/ metastatic adenoCa. Likely lung mets as well.  Will likely need Oncology input for prognosis and treatment options.       Rob Stanislaus Kaltenbach  MD 03/17/2021, 6:59 PM  Recent Labs  Lab 03/16/21 1642 03/17/21 0415  WBC 15.7* 15.7*  HGB 7.7* 7.6*   Recent Labs  Lab 03/13/21 0157 03/13/21 0540 03/17/21 0415 03/17/21 1420  K 4.9   < > 5.4* 5.4*  BUN 44*   < > 78* 85*  CREATININE 1.43*   < > 3.71* 4.16*  CALCIUM 8.6*   < > 7.6* 7.6*  PHOS 3.6  --   --   --    < > = values in this interval not displayed.

## 2021-03-17 NOTE — Progress Notes (Signed)
PROGRESS NOTE    Laura Sherman  GEX:[id number] DOB: Jul 31, 1951 DOA: 02/27/2021 PCP: Reita Cliche, MD   Brief Narrative:  70 year old woman PMH breast cancer, uterine cancer, diabetes mellitus type 2, hypertension presented to the emergency department with 1 week history of failure to thrive, anorexia, fatigue. 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 Seen by gastroenterology with plans for ERCP and EUS, delayed by coagulopathy.  Attempted ERCP on 03/21/2021 was unsuccessful, repeat ERCP with stent placement successful on 03/18/2021 with moderate hypotension and respiratory distress transferred to the ICU for closer monitoring overnight.  Assessment & Plan:   Principal Problem:   Obstructive jaundice due to malignant neoplasm (HCC) Active Problems:   AKI (acute kidney injury) ([place])   Hyperkalemia   DM type 2 (diabetes mellitus, type 2) (HCC)   Coagulopathy (HCC)   Aortic atherosclerosis (HCC)   Hypercalcemia   Acidosis   Shock circulatory (HCC)   Obstructive jaundice, elevated LFTs, abnormal CT and MRI secondary to obstructing adenocarcinoma. - LFTs downtrending.  Jaundice is significant but minimally improving.   - ERCP unsuccessful 03/13/2021, repeat attempt 03/24/2021 successful stent placement - Liver enzymes downtrending appropriately, jaundice improving - MELD 30 today -indicating very poor prognosis  Acute hypoxic respiratory failure/hypotension/encephalopathy, resolving - Patient had again transient episode of hypotension overnight requiring Levophed but has now been weaned off.  Remains on IV fluids per PCCM - Hold metoprolol while somewhat hypotensive postprocedure  Pancreatic mass, multiple lung nodules, multiple hepatic lesions consistent with metastatic adenocarcinoma. - ERCP as above successful stent placement 5/5 - Patient wants to follow-up with her oncologist in [facility] Dr. Dustin Folks -contacted by previous physician on 03/12/21 to arrange close outpatient follow-up. Okay to use right arm for blood draws and IV. -Palliative care to follow, poor prognosis in the setting of meld score  Coagulopathy, most likely secondary to metastatic liver disease - Coagulopathy corrected with vitamin K. - Patient and family understand risk of VTE given malignancy.  Acute kidney injury secondary to poor oral intake/prerenal azotemia Concurrent hyperkalemia - Creatinine continues to rise, likely in setting of volume overload - Lasix held given further worsening of AKI - Follow repeat Lokelma given overnight  SVT, resolved - TSH within normal limits. 2D echocardiogram unremarkable.   - Electrolytes stable.   - Continue diltiazem -currently holding metoprolol as above - Cardiology previously signed off  Diabetes mellitus type 2 with hyperglycemia hemoglobin A1c 8.0 - Continue to hold oral medications. Still hyperglycemic. Will increase Lantus further. Continue sliding scale insulin.  Hyponatremia secondary to poor oral intake, corrected 131 - Resolved.   Aortic atherosclerosis -No treatment indicated  Hypercalcemia, likely secondary to malignancy - Resolved with hydration.  PMH  Endometrial carcinoma status post robotic assisted hysterectomy and bilateral salpingo-oophorectomy in 2014. 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, radiation and chemo post mastectomy ER positive HER-2/neu negative completed antiestrogen therapy  DVT prophylaxis: SCDs only perioperatively Code Status: Full Family Communication: None  Status is: Inpatient  Dispo: The patient is from: Home              Anticipated d/c is to: TBD              Anticipated d/c date is: >72 hours pending clinical course              Patient currently not medically stable for discharge  Consultants:  GI  Cardiology  PCCM  Palliative Care  Procedures/Imaging:   CT chest, abd, pelvis w/ pancreatic mass, hepatic and lung lesions concerning for metastatic disease  MRI w/ multiple liver lesions c/w metastatic disease, associated with biliary and pancreatic duct obstruction, lesion in the head of the pancreas, suspected pancreatic adenocarcinoma, multiple pulmonary nodules  Echo LVEF 99-37%, grade 1 diastolic dysfunction  Antimicrobials:  Cefepime  Subjective: No acute issues or events overnight patient much more awake, alert and oriented this am; denies nausea vomiting diarrhea constipation headache fevers or chills.  Objective: Vitals:   03/17/21 0200 03/17/21 0300 03/17/21 0400 03/17/21 0500  BP: (!) 99/49 (!) 107/51 (!) 111/47 (!) 121/57  Pulse: 94 87 93 94  Resp: 20 20 (!) 26 20  Temp:      TempSrc:      SpO2: 95% 96% 96% 94%  Weight:      Height:        Intake/Output Summary (Last 24 hours) at 03/17/2021 0701 Last data filed at 03/17/2021 0513 Gross per 24 hour  Intake 3239.09 ml  Output 50 ml  Net 3189.09 ml   Filed Weights   02/09/2021 1046 04/10/2021 1123  Weight: 98 kg 98 kg    Examination:  General:  Pleasantly resting in bed, No acute distress. HEENT:  Normocephalic atraumatic.  Sclerae slightly icteric, noninjected.  Extraocular movements intact bilaterally. Neck:  Without mass or deformity.  Trachea is midline. Lungs:  Clear to auscultate bilaterally without rhonchi, wheeze, or rales. Heart:  Regular rate and rhythm. Without murmurs, rubs, or gallops. Abdomen:  Soft, nontender, minimally distended. Without guarding or rebound. Extremities: Without cyanosis, clubbing, moderate edema 3+ pitting bilateral upper lower and lower extremities. Vascular:  Dorsalis pedis and posterior tibial pulses palpable bilaterally. Skin:  Warm and dry, moderately jaundiced  Data Reviewed: I have personally reviewed following labs and imaging studies  CBC: Recent Labs   Lab 04/02/2021 1554 03/28/2021 0455 03/27/2021 2110 03/16/21 1642 03/17/21 0415  WBC  --  14.6* 19.0* 15.7* 15.7*  HGB 9.9* 8.6* 7.9* 7.7* 7.6*  HCT 29.0* 27.6* 25.9* 25.3* 24.8*  MCV  --  92.3 94.5 96.2 94.7  PLT  --  267 278 242 169   Basic Metabolic Panel: Recent Labs  Lab 03/11/21 0505 03/12/21 0502 03/13/21 0157 03/13/21 0540 03/12/2021 0438 04/01/2021 1554 03/11/2021 0455 03/16/21 0300 03/16/21 1642 03/17/21 0415  NA 137   < > 129*  --  131* 136 135 138 136 133*  K 5.2*   < > 4.9   < > 5.1 5.5* 5.6* 5.8* 5.4* 5.4*  CL 112*   < > 104  --  110  --  106 110 109 101  CO2 17*   < > 16*  --  14*  --  19* 16* 16* 21*  GLUCOSE 178*   < > 221*  --  203*  --  216* 228* 203* 210*  BUN 41*   < > 44*  --  48*  --  56* 69* 78* 78*  CREATININE 1.09*   < > 1.43*  --  1.52*  --  2.28* 2.92* 3.56* 3.71*  CALCIUM 8.8*   < > 8.6*  --  8.5*  --  8.4* 8.2* 8.2* 7.6*  MG 2.1  --  1.9  --   --   --   --   --   --   --   PHOS  --   --  3.6  --   --   --   --   --   --   --    < > =  values in this interval not displayed.   GFR: Estimated Creatinine Clearance: 15.5 mL/min (A) (by C-G formula based on SCr of 3.71 mg/dL (H)). Liver Function Tests: Recent Labs  Lab 04/09/2021 0438 04/05/2021 0455 03/16/21 0300 03/16/21 1642 03/17/21 0415  AST 268* 238* 143* 113* 78*  ALT 243* 205* 173* 159* 132*  ALKPHOS 1,633* 1,605* 1,405* 1,337* 1,133*  BILITOT 16.7* 20.0* 16.1* 12.8* 9.2*  PROT 5.8* 5.8* 5.7* 6.0* 5.4*  ALBUMIN 1.8* 2.0* 2.2* 2.2* 1.9*   Recent Labs  Lab 03/17/2021 2110 03/16/21 0300  LIPASE 45 51   No results for input(s): AMMONIA in the last 168 hours. Coagulation Profile: Recent Labs  Lab 03/16/2021 0438 03/17/2021 0455 03/16/21 0300 03/16/21 1642 03/17/21 0415  INR 1.2 1.1 1.2 1.2 1.2   Cardiac Enzymes: No results for input(s): CKTOTAL, CKMB, CKMBINDEX, TROPONINI in the last 168 hours. BNP (last 3 results) No results for input(s): PROBNP in the last 8760 hours. HbA1C: No  results for input(s): HGBA1C in the last 72 hours. CBG: Recent Labs  Lab 04/01/2021 2139 03/16/21 0728 03/16/21 1133 03/16/21 1610 03/16/21 2213  GLUCAP 229* 213* 220* 195* 184*   Lipid Profile: No results for input(s): CHOL, HDL, LDLCALC, TRIG, CHOLHDL, LDLDIRECT in the last 72 hours. Thyroid Function Tests: No results for input(s): TSH, T4TOTAL, FREET4, T3FREE, THYROIDAB in the last 72 hours. Anemia Panel: No results for input(s): VITAMINB12, FOLATE, FERRITIN, TIBC, IRON, RETICCTPCT in the last 72 hours. Sepsis Labs: Recent Labs  Lab 03/18/2021 0455 04/08/2021 1714 03/29/2021 2110 03/17/21 0415  PROCALCITON 19.15  --   --   --   LATICACIDVEN  --  0.8 0.7 0.9    Recent Results (from the past 240 hour(s))  Resp Panel by RT-PCR (Flu A&B, Covid) Nasopharyngeal Swab     Status: None   Collection Time: 03/02/2021 11:40 AM   Specimen: Nasopharyngeal Swab; Nasopharyngeal(NP) swabs in vial transport medium  Result Value Ref Range Status   SARS Coronavirus 2 by RT PCR NEGATIVE NEGATIVE Final    Comment: (NOTE) SARS-CoV-2 target nucleic acids are NOT DETECTED.  The SARS-CoV-2 RNA is generally detectable in upper respiratory specimens during the acute phase of infection. The lowest concentration of SARS-CoV-2 viral copies this assay can detect is 138 copies/mL. A negative result does not preclude SARS-Cov-2 infection and should not be used as the sole basis for treatment or other patient management decisions. A negative result may occur with  improper specimen collection/handling, submission of specimen other than nasopharyngeal swab, presence of viral mutation(s) within the areas targeted by this assay, and inadequate number of viral copies(<138 copies/mL). A negative result must be combined with clinical observations, patient history, and epidemiological information. The expected result is Negative.  Fact Sheet for Patients:  EntrepreneurPulse.com.au  Fact Sheet  for Healthcare Providers:  IncredibleEmployment.be  This test is no t yet approved or cleared by the [place] FDA and  has been authorized for detection and/or diagnosis of SARS-CoV-2 by FDA under an Emergency Use Authorization (EUA). This EUA will remain  in effect (meaning this test can be used) for the duration of the COVID-19 declaration under Section 564(b)(1) of the Act, 21 U.S.C.section 360bbb-3(b)(1), unless the authorization is terminated  or revoked sooner.       Influenza A by PCR NEGATIVE NEGATIVE Final   Influenza B by PCR NEGATIVE NEGATIVE Final    Comment: (NOTE) The Xpert Xpress SARS-CoV-2/FLU/RSV plus assay is intended as an aid in the diagnosis of influenza from Nasopharyngeal  swab specimens and should not be used as a sole basis for treatment. Nasal washings and aspirates are unacceptable for Xpert Xpress SARS-CoV-2/FLU/RSV testing.  Fact Sheet for Patients: EntrepreneurPulse.com.au  Fact Sheet for Healthcare Providers: IncredibleEmployment.be  This test is not yet approved or cleared by the [place] FDA and has been authorized for detection and/or diagnosis of SARS-CoV-2 by FDA under an Emergency Use Authorization (EUA). This EUA will remain in effect (meaning this test can be used) for the duration of the COVID-19 declaration under Section 564(b)(1) of the Act, 21 U.S.C. section 360bbb-3(b)(1), unless the authorization is terminated or revoked.  Performed at [facility], [address] [place], [place] [address]   MRSA PCR Screening     Status: None   Collection Time: 03/12/2021  4:51 PM   Specimen: Nasal Mucosa; Nasopharyngeal  Result Value Ref Range Status   MRSA by PCR NEGATIVE NEGATIVE Final    Comment:        The GeneXpert MRSA Assay (FDA approved for NASAL specimens only), is one component of a comprehensive MRSA colonization surveillance program. It is  not intended to diagnose MRSA infection nor to guide or monitor treatment for MRSA infections. Performed at [facility], [place] [address], [place], [place] [address]     Radiology Studies: DG Chest Port 1 View  Result Date: 03/17/2021 CLINICAL DATA:  CVL placement ERCP EXAM: PORTABLE CHEST 1 VIEW COMPARISON:  03/11/2021, CT 02/21/2021, chest x-ray 03/13/2021 FINDINGS: Right-sided central venous catheter tip over the SVC. No pneumothorax. Multiple bilateral pulmonary nodules consistent with metastatic disease. Stable cardiomediastinal silhouette. Probable vascular congestion. Small left effusion with airspace disease at left lung base. No pneumothorax. IMPRESSION: 1. Right IJ central venous catheter tip over the SVC. No pneumothorax. 2. Pulmonary metastatic disease. Similar small left effusion and basilar airspace disease. Electronically Signed   By: Donavan Foil M.D.   On: 03/13/2021 17:59   DG ERCP BILIARY & PANCREATIC DUCTS  Result Date: 04/08/2021 CLINICAL DATA:  Pancreatic cancer. EXAM: ERCP TECHNIQUE: Multiple spot images obtained with the fluoroscopic device and submitted for interpretation post-procedure. COMPARISON:  ERCP-03/15/2019; MRCP-03/09/2021 FLUOROSCOPY TIME:  14 minutes, 33 seconds FINDINGS: Multiple spot fluoroscopic images the right upper abdominal quadrant are provided for review. Initial image demonstrates an ERCP probe overlying the right upper abdominal quadrant. Subsequent images demonstrate opacification of the descending duodenum, ultimately with successful cannulation and opacification of the CBD which appears markedly abnormal with beaded irregularity. There is minimal opacification of the intrahepatic biliary tree which appears at least moderately dilated centrally. Completion image demonstrates placement of an internal biliary stent overlying the expected location of the CBD. IMPRESSION: ERCP with internal biliary stent placement as above. These images  were submitted for radiologic interpretation only. Please see the procedural report for the amount of contrast and the fluoroscopy time utilized. Electronically Signed   By: Sandi Mariscal M.D.   On: 04/06/2021 15:05   Scheduled Meds: . albuterol  2.5 mg Nebulization BID  . B-complex with vitamin C  1 tablet Oral Daily  . budesonide  0.5 mg Nebulization BID  . calcium carbonate  500 mg of elemental calcium Oral Q breakfast  . chlorhexidine  15 mL Mouth Rinse BID  . Chlorhexidine Gluconate Cloth  6 each Topical Daily  . diltiazem  90 mg Oral Q6H  . fluticasone  1 spray Each Nare BID  . guaiFENesin  600 mg Oral BID  . insulin aspart  0-15 Units Subcutaneous TID WC  .  insulin aspart  0-5 Units Subcutaneous QHS  . insulin glargine  12 Units Subcutaneous Daily  . loratadine  10 mg Oral Daily  . mouth rinse  15 mL Mouth Rinse q12n4p  . montelukast  10 mg Oral Daily  . multivitamin with minerals  1 tablet Oral Daily  . pantoprazole  40 mg Oral Daily  . sodium chloride flush  10-40 mL Intracatheter Q12H  . sodium chloride flush  3 mL Intravenous Q12H  . sodium zirconium cyclosilicate  10 g Oral Once  . [START ON 03/26/2021] Vitamin D (Ergocalciferol)  50,000 Units Oral Q14 Days  . vitamin E  100 Units Oral Daily   Continuous Infusions: . sodium chloride 0 mL (03/17/21 0236)  . ceFEPime (MAXIPIME) IV Stopped (03/16/21 1825)  . metronidazole Stopped (03/17/21 7616)  . norepinephrine (LEVOPHED) Adult infusion 2 mcg/min (03/17/21 0240)  .  sodium bicarbonate (isotonic) infusion in sterile water 100 mL/hr at 03/17/21 0233    LOS: 9 days   Time spent: 48mn  Simonne Boulos C Trellis Guirguis, DO [facility]  If 7PM-7AM, please contact night-coverage [email or web address]  03/17/2021, 7:01 AM

## 2021-03-17 NOTE — Progress Notes (Signed)
[place] Progress Note Patient Name: DANICIA TERHAAR DOB: [birth year] MRN: [id number]   Date of Service  03/17/2021  HPI/Events of Note  Hyperkalemia - K+ = 5.4.  eICU Interventions  Plan: 1. Lokelma 10 gm PO X 1 now. 2. Repeat BMP at 12 noon.      Intervention Category Major Interventions: Electrolyte abnormality - evaluation and management  Sahvanna Mcmanigal [place] 03/17/2021, 6:31 AM

## 2021-03-17 NOTE — Progress Notes (Signed)
[place] Progress Note Patient Name: Laura Sherman DOB: Nov 30, 1950 MRN: [id number]   Date of Service  03/17/2021  HPI/Events of Note  Hypotension - BP = 101/42 with MAP = 58. CVP = 13. Last Hgb = 7.7.  eICU Interventions  Plan: 1. Norepinephrine IV infusion. Titrate to MAP >= 65.     Intervention Category Major Interventions: Hypotension - evaluation and management  Nuri Larmer Eugene 03/17/2021, 2:20 AM

## 2021-03-17 NOTE — Progress Notes (Signed)
NAME:  Laura Sherman, MRN:  [id number], DOB:  1951/09/27, LOS: 9 ADMISSION DATE:  03/23/21, CONSULTATION DATE:  04/08/2021 REFERRING MD:  Dr. Avon Gully, CHIEF COMPLAINT:  Hypotension   History of Present Illness:  Laura Sherman is a 70 y.o. F with PMH significant for uterine cancer, breast cancer, HTN, GERD, type II diabetes, and asthma who presented 4/28 with complaints of failure to thrive, anorexia, and fatigue. ON ED arrival she was seen with AKI, hyperkalemia, and jaundice with recent diagnosis of metastatic liver disease. She was admitted per [facility] with GI consult.   Thus far during hospitalization patient has underwent attempted ERCP 5/4 (initially delayed due to coagulapathy) that was unsuccessful. FNA of liver lesion pathology revealed malignant cells consistent with adenocarcinoma.  Repeat ERCP with stent was completed 5/5. Post Repeat ERCP patient was seen with hypotension requiring pressor support and respiratory distress requiring BIPAP therapy. PCCM consulted for further assistance in management.   Pertinent  Medical History  Uterine cancer, breast cancer, HTN, GERD, type II diabetes, and asthma  Significant Hospital Events: Including procedures, antibiotic start and stop dates in addition to other pertinent events   . 4/28 Admitted with complaints of failure to thrive, anorexia, and fatigue . 5/1 vitamin K given for persistent coagulapathy   . 5/4 liver bx adenocarcinoma . 5/4 Attempted ERCP was unsuccessful  . 5/5 Repeat ERCP with stent was completed 5/5. Post Repeat ERCP patient was seen with hypotension requiring pressor support and respiratory distress requiring BIPAP therapy    Scheduled Meds: . albuterol  2.5 mg Nebulization BID  . B-complex with vitamin C  1 tablet Oral Daily  . budesonide  0.5 mg Nebulization BID  . calcium carbonate  500 mg of elemental calcium Oral Q breakfast  . chlorhexidine  15 mL Mouth Rinse BID  . Chlorhexidine Gluconate Cloth  6 each  Topical Daily  . diltiazem  90 mg Oral Q6H  . fluticasone  1 spray Each Nare BID  . guaiFENesin  600 mg Oral BID  . insulin aspart  0-15 Units Subcutaneous TID WC  . insulin aspart  0-5 Units Subcutaneous QHS  . insulin glargine  12 Units Subcutaneous Daily  . loratadine  10 mg Oral Daily  . mouth rinse  15 mL Mouth Rinse q12n4p  . montelukast  10 mg Oral Daily  . multivitamin with minerals  1 tablet Oral Daily  . pantoprazole  40 mg Oral Daily  . sodium chloride flush  10-40 mL Intracatheter Q12H  . sodium chloride flush  3 mL Intravenous Q12H  . [START ON 03/26/2021] Vitamin D (Ergocalciferol)  50,000 Units Oral Q14 Days  . vitamin E  100 Units Oral Daily   Continuous Infusions: . sodium chloride 0 mL (03/17/21 0236)  . ceFEPime (MAXIPIME) IV Stopped (03/16/21 1825)  . metronidazole Stopped (03/17/21 8413)  . norepinephrine (LEVOPHED) Adult infusion 2 mcg/min (03/17/21 0240)  .  sodium bicarbonate (isotonic) infusion in sterile water 100 mL/hr at 03/17/21 0233   PRN Meds:.acetaminophen, albuterol, guaiFENesin-dextromethorphan, ondansetron **OR** ondansetron (ZOFRAN) IV, oxymetazoline, pseudoephedrine, sodium chloride flush    Interim History / Subjective:  " my urine output is down" - no cp or sob   Objective   Blood pressure (!) 116/53, pulse 95, temperature 98.1 F (36.7 C), temperature source Oral, resp. rate (!) 21, height 5\' 3"  (1.6 m), weight 98 kg, SpO2 97 %. CVP:  [11 mmHg-13 mmHg] 12 mmHg      Intake/Output Summary (Last 24 hours) at  03/17/2021 0941 Last data filed at 03/17/2021 0513 Gross per 24 hour  Intake 3119.09 ml  Output 50 ml  Net 3069.09 ml   Filed Weights   03/27/2021 1046 03/15/2021 1123  Weight: 98 kg 98 kg    Examination:  tmax  On 02  2lpm sats 93%  General appearance:   Chronically ill appearing / slt jaundiced  Oropharynx clear,  mucosa nl Neck supple Lungs with a few scattered exp > insp rhonchi bilaterally RRR no s3 or or sign murmur Abd  mildly distended/ limited  excursion  Extr warm with trace  edema  Neuro  Sensorium alert,  no apparent motor deficits     I personally reviewed images and agree with radiology impression as follows:  CXR:   5/5 portable Pulmonary metastatic disease. Similar small left effusion and basilar airspace disease.     Labs/imaging that I havepersonally reviewed     Resolved Hospital Problem list     Assessment & Plan:   Obstructive jaundice with CT evidence of pancreatic malignancy s/p ERCP with stent.  FNA of liver with metastatic Adenocarcinoma with likely lung mets also Elevated LFTs Coagulapathy, improved  -GI following  >>>  Palliative Care consult next step here, limit to what  Can be offered    Hypotension/ resolved       Acute Hypoxic Respiratory Failure  Developed post repeat ERCP, felt secondary to sedation.  -PRN BiPAP if work of breathing  -aspiration precautions  -wean O2 for sats >90% -pulmonary hygiene as able  -minimize sedation   Paroxysmal SVT/PAT Hx of HTN  ECHO with EF 70-75% with grade 1 diastolic dysfunction  -Cardiology following, appreciate evaluation  -tele monitoring  -on beta blocker, CCB -follow daily weights   Acute Kidney Injury  Hyperkalemia  Metabolic acidosis  Lab Results  Component Value Date   CREATININE 3.71 (H) 03/17/2021   CREATININE 3.56 (H) 03/16/2021   CREATININE 2.92 (H) 03/16/2021   Patient initally presented with AKI, creatinine 2.38 on admisison. AKI improved with hydration but creatinine again elevated 2.28 5/5. Patient did receive lasix x1 5/4  -Trend BMP / urinary output -Replace electrolytes as indicated -Avoid nephrotoxic agents, ensure adequate renal perfusion - HC03 drip added pm 5/6 and HC03 up to 21 am 5/7 > continue for now >>> consider foley if not voiding (or check bladder scanner to be sure not retaining) >>> nephrology eval prn per [facility]      DM II  -SSI  -per primary    Best practice   Diet:   NPO while on BIPAP, advance per GI Pain/Anxiety/Delirium protocol (if indicated): No VAP protocol (if indicated): Not indicated DVT prophylaxis: Contraindicated GI prophylaxis: PPI Glucose control:  SSI Yes Central venous access:  Yes, and it is still needed Arterial line:  N/A Foley:  N/A Mobility:  OOB  PT consulted: Yes Last date of multidisciplinary goals of care discussion: Per primary  Code Status:  full code Disposition: SDU      Christinia Gully, MD [facility] [place] [phone]   After 7:00 pm call Elink  [phone]

## 2021-03-17 NOTE — Progress Notes (Signed)
Patient blood pressures have been stable since coming off pressors 24 hours ago. Patient is now having decrease in blood pressure, and her pressor support has been discontinued. Contacted [facility] for updated orders. Patient has had no UOP despite IV fluid increase. Patient remains NPO per order.

## 2021-03-17 NOTE — Progress Notes (Signed)
[place] Progress Note Patient Name: Laura Sherman DOB: 08/21/1951 MRN: [id number]   Date of Service  03/17/2021  HPI/Events of Note  ABG on [place] O2 = 7.205/43.1/76.2/16.4. LVEF = 70-75%.  eICU Interventions  Plan: 1. NaHCO3 100 meq IV now. 2. Increase NaHCO3 IV infusion to 100 mL/hour. 3. Repeat ABG at 7:30 AM.     Intervention Category Major Interventions: Acid-Base disturbance - evaluation and management  Lanora Reveron Eugene 03/17/2021, 12:05 AM

## 2021-03-18 ENCOUNTER — Inpatient Hospital Stay (HOSPITAL_COMMUNITY): Payer: Medicare Other

## 2021-03-18 DIAGNOSIS — K7200 Acute and subacute hepatic failure without coma: Secondary | ICD-10-CM

## 2021-03-18 DIAGNOSIS — Z515 Encounter for palliative care: Secondary | ICD-10-CM

## 2021-03-18 DIAGNOSIS — N179 Acute kidney failure, unspecified: Secondary | ICD-10-CM | POA: Diagnosis not present

## 2021-03-18 DIAGNOSIS — C801 Malignant (primary) neoplasm, unspecified: Secondary | ICD-10-CM | POA: Diagnosis not present

## 2021-03-18 DIAGNOSIS — K831 Obstruction of bile duct: Secondary | ICD-10-CM | POA: Diagnosis not present

## 2021-03-18 DIAGNOSIS — Z7189 Other specified counseling: Secondary | ICD-10-CM

## 2021-03-18 DIAGNOSIS — C259 Malignant neoplasm of pancreas, unspecified: Secondary | ICD-10-CM | POA: Diagnosis not present

## 2021-03-18 DIAGNOSIS — E872 Acidosis: Secondary | ICD-10-CM | POA: Diagnosis not present

## 2021-03-18 DIAGNOSIS — C7802 Secondary malignant neoplasm of left lung: Secondary | ICD-10-CM

## 2021-03-18 DIAGNOSIS — C7801 Secondary malignant neoplasm of right lung: Secondary | ICD-10-CM

## 2021-03-18 LAB — COMPREHENSIVE METABOLIC PANEL
ALT: 108 U/L — ABNORMAL HIGH (ref 0–44)
AST: 54 U/L — ABNORMAL HIGH (ref 15–41)
Albumin: 1.9 g/dL — ABNORMAL LOW (ref 3.5–5.0)
Alkaline Phosphatase: 1007 U/L — ABNORMAL HIGH (ref 38–126)
Anion gap: 11 (ref 5–15)
BUN: 92 mg/dL — ABNORMAL HIGH (ref 8–23)
CO2: 18 mmol/L — ABNORMAL LOW (ref 22–32)
Calcium: 7.6 mg/dL — ABNORMAL LOW (ref 8.9–10.3)
Chloride: 102 mmol/L (ref 98–111)
Creatinine, Ser: 4.72 mg/dL — ABNORMAL HIGH (ref 0.44–1.00)
GFR, Estimated: 9 mL/min — ABNORMAL LOW (ref >60)
Glucose, Bld: 190 mg/dL — ABNORMAL HIGH (ref 70–99)
Potassium: 5.6 mmol/L — ABNORMAL HIGH (ref 3.5–5.1)
Sodium: 131 mmol/L — ABNORMAL LOW (ref 135–145)
Total Bilirubin: 7.3 mg/dL — ABNORMAL HIGH (ref 0.3–1.2)
Total Protein: 5.4 g/dL — ABNORMAL LOW (ref 6.5–8.1)

## 2021-03-18 LAB — GLUCOSE, CAPILLARY
Glucose-Capillary: 182 mg/dL — ABNORMAL HIGH (ref 70–99)
Glucose-Capillary: 172 mg/dL — ABNORMAL HIGH (ref 70–99)
Glucose-Capillary: 184 mg/dL — ABNORMAL HIGH (ref 70–99)
Glucose-Capillary: 168 mg/dL — ABNORMAL HIGH (ref 70–99)

## 2021-03-18 LAB — CBC
HCT: 23.6 % — ABNORMAL LOW (ref 36.0–46.0)
Hemoglobin: 7.3 g/dL — ABNORMAL LOW (ref 12.0–15.0)
MCH: 28.9 pg (ref 26.0–34.0)
MCHC: 30.9 g/dL (ref 30.0–36.0)
MCV: 93.3 fL (ref 80.0–100.0)
Platelets: 230 10*3/uL (ref 150–400)
RBC: 2.53 MIL/uL — ABNORMAL LOW (ref 3.87–5.11)
RDW: 18.6 % — ABNORMAL HIGH (ref 11.5–15.5)
WBC: 15.1 10*3/uL — ABNORMAL HIGH (ref 4.0–10.5)
nRBC: 0.2 % (ref 0.0–0.2)

## 2021-03-18 LAB — PROTIME-INR
INR: 1.2 (ref 0.8–1.2)
Prothrombin Time: 15.4 seconds — ABNORMAL HIGH (ref 11.4–15.2)

## 2021-03-18 NOTE — Plan of Care (Signed)

## 2021-03-18 NOTE — Progress Notes (Signed)
[facility] Progress Note  Subjective: seen in room, alert and w/o SOB or cough.  2.2 L in and 25 cc UOP yest.    Vitals:   03/18/21 0300 03/18/21 0400 03/18/21 0739 03/18/21 0800  BP: 110/61 (!) 107/52  (!) 109/53  Pulse: 79 77  78  Resp: (!) 27 (!) 24  (!) 25  Temp:  98.3 F (36.8 C) 97.6 F (36.4 C)   TempSrc:  Axillary Oral   SpO2: 96% 94%  92%  Weight:      Height:        Exam: Gen alert, no distress, elderly WF, pleasant No jvd or bruits Chest clear bilat to bases RRR no MRG Abd soft ntnd no mass or ascites +bs GU foley cath, min UOP MS no joint effusions or deformity Ext diffuse 2+ pitting edema of LE > UR edema Neuro is alert, Ox 3, no asterixis, nonfocal, generally deconditioned   Home meds:  - norvasc 10/ hctz 25 qd/ losartan-hctz 100-25 qd/ metoprolol 100 bid/ olmesartan 40 qd  - actos 30 qd/ metformin 500 bid ac/ glipizide 2.5 qd  - voltaren 75 bid  - lipitor 20 qd/ prilosec 10 qd  - singulair 10 qd/ flovent diskus bid  - prn's/ vitamins/ supplements     CE - b/l creatinine 1.00 , eGFR 57 ml/min , on Sep 13, 2020     IV abx:             - IV cipro on 5/04 - current             - IV flagyl 5/05 - current             - IV cefepime 5/05 - current     UNa 44, UCr 88     UA 03/09/2021 - 100 prot, >50 rbc, 0-5 wbc/ epi, few bact, amorphous urates     UA 5/07 > 6-10 wbc/ 21-50 rbc/ 11-20 epi, rare bact, prot 100, cloudy    Renal US - done, reading pending  Assessment/ Plan: 1. AKI on CKD IIIA - b/l creat 1.0 from Nov 2021, eGFR 57 ml/min. Creat 1.6 on admit in setting of ^LFT"s and biliary obstruction. Had stable renal function until 5/04 then w/ dropping UOP and rising creat up to 4.1 yest and 4.7 today. Had period of shock the last 2-3 days on pressors off and on, off now. Suspect AKI due to shock vs ATN from bilirubin nephrotoxicity vs bladder retention vs other. Foley placed. Urine lytes c/w ATN. Renal US results pending. Giving trial of  midodrine 10 bid for soft BP's. Significantly vol overloaded but w/o resp issues, IVF"s dc'd. No signs of uremia, no need for RRT at this time yet but will likely need soon. Will follow.  2. Hyperkalemia - changed to renal diet and started lokelma bid. 5.4 today  3. Shock - not clear cause, fluids dc'd. Use pressors as needed.  4. ^WBC - started on IV abx, per primary team 5. Obstructive jaundice w/ pancreatic malignancy by CT- sp FNA of liver mass + for metastatic adenocarcinoma, and sp ERCP w/ biliary stent. Likely lung mets as well.  Recommend oncology input for prognosis and treatment options.         Rob Binnie Droessler 03/18/2021, 8:55 AM   Recent Labs  Lab 03/13/21 0157 03/13/21 0540 03/17/21 0415 03/17/21 1420 03/18/21 0744  K 4.9   < > 5.4* 5.4* 5.6*  BUN 44*   < > 78* 85*  92*  CREATININE 1.43*   < > 3.71* 4.16* 4.72*  CALCIUM 8.6*   < > 7.6* 7.6* 7.6*  PHOS 3.6  --   --   --   --   HGB  --    < > 7.6*  --  7.3*   < > = values in this interval not displayed.   Inpatient medications: . albuterol  2.5 mg Nebulization BID  . B-complex with vitamin C  1 tablet Oral Daily  . budesonide  0.5 mg Nebulization BID  . calcium carbonate  500 mg of elemental calcium Oral Q breakfast  . chlorhexidine  15 mL Mouth Rinse BID  . Chlorhexidine Gluconate Cloth  6 each Topical Daily  . diltiazem  90 mg Oral Q6H  . fluticasone  1 spray Each Nare BID  . guaiFENesin  600 mg Oral BID  . insulin aspart  0-15 Units Subcutaneous TID WC  . insulin aspart  0-5 Units Subcutaneous QHS  . insulin glargine  12 Units Subcutaneous Daily  . loratadine  10 mg Oral Daily  . mouth rinse  15 mL Mouth Rinse q12n4p  . midodrine  10 mg Oral BID WC  . montelukast  10 mg Oral Daily  . multivitamin with minerals  1 tablet Oral Daily  . pantoprazole  40 mg Oral Daily  . sodium chloride flush  10-40 mL Intracatheter Q12H  . sodium chloride flush  3 mL Intravenous Q12H  . sodium zirconium cyclosilicate  10 g  Oral BID  . [START ON 03/26/2021] Vitamin D (Ergocalciferol)  50,000 Units Oral Q14 Days  . vitamin E  100 Units Oral Daily   . ceFEPime (MAXIPIME) IV Stopped (03/17/21 1738)  . metronidazole 500 mg (03/18/21 0701)  . norepinephrine (LEVOPHED) Adult infusion Stopped (03/17/21 0647)   acetaminophen, albuterol, guaiFENesin-dextromethorphan, ondansetron **OR** ondansetron (ZOFRAN) IV, pseudoephedrine, sodium chloride flush

## 2021-03-18 NOTE — Progress Notes (Signed)
PROGRESS NOTE    Laura Sherman  YNW:[id number] DOB: 1951/11/04 DOA: 03/07/2021 PCP: Reita Cliche, MD   Brief Narrative:  70 year old woman PMH breast cancer, uterine cancer, diabetes mellitus type 2, hypertension presented to the emergency department with 1 week history of failure to thrive, anorexia, fatigue. 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 Seen by gastroenterology with plans for ERCP and EUS, delayed by coagulopathy.  Attempted ERCP on 03/31/2021 was unsuccessful, repeat ERCP with stent placement successful on 04/10/2021 with moderate hypotension and respiratory distress transferred to the ICU for closer monitoring overnight.  Assessment & Plan:   Principal Problem:   Obstructive jaundice due to malignant neoplasm [facility]) Active Problems:   AKI (acute kidney injury) ([place])   Hyperkalemia   DM type 2 (diabetes mellitus, type 2) (HCC)   Coagulopathy (HCC)   Aortic atherosclerosis (HCC)   Hypercalcemia   Acidosis   Shock circulatory (HCC)  Goals of care discussion -Lengthy discussion today at bedside with patient, daughter and over the phone with husband, patient made DNR after lengthy discussion. 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  Acute kidney injury secondary to poor oral intake/prerenal azotemia versus ATN Concurrent hyperkalemia -Nephrology following, appreciate insight and recommendations -Hyperkalemia ongoing, continue as needed Lokelma -No indication for dialysis or CRRT at this time but patient remains markedly volume overloaded with minimal to no urine output over the past 24 hours  Obstructive jaundice, elevated LFTs, abnormal CT and MRI secondary to  obstructing metastatic pancreatic adenocarcinoma, resolving - LFTs downtrending.    Jaundice resolving.   - ERCP unsuccessful 03/21/2021, repeat attempt 04/05/2021 successful stent placement - MELD 30 -indicating very poor prognosis  Acute hypoxic respiratory failure/hypotension/encephalopathy, resolving - Patient had again transient episode of hypotension overnight requiring Levophed but has now been weaned off.  Remains on IV fluids per PCCM - Hold metoprolol while somewhat hypotensive postprocedure  Pancreatic mass, multiple lung nodules, multiple hepatic lesions consistent with metastatic adenocarcinoma. - ERCP as above successful stent placement 5/5 - Patient wants to follow-up with her oncologist in [facility] Dr. Dustin Folks -contacted by previous physician on 03/12/21 to arrange close outpatient follow-up. Okay to use right arm for blood draws and IV. -Palliative care to follow, poor prognosis in the setting of meld score  Coagulopathy, most likely secondary to metastatic liver disease - Coagulopathy corrected with vitamin K. - Patient and family understand risk of VTE given malignancy.  SVT, resolved - TSH within normal limits. 2D echocardiogram unremarkable.   - Electrolytes stable.   - Continue diltiazem -currently holding metoprolol as above - Cardiology previously signed off  Diabetes mellitus type 2 with hyperglycemia hemoglobin A1c 8.0 - Continue to hold oral medications. Still hyperglycemic. Will increase Lantus further. Continue sliding scale insulin.  Hyponatremia secondary to poor oral intake, corrected 131 - Resolved.   Aortic atherosclerosis -No treatment indicated  Hypercalcemia, likely secondary to malignancy - Resolved with hydration.  PMH  Endometrial carcinoma status post robotic assisted hysterectomy and bilateral salpingo-oophorectomy in 2014. 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, radiation and chemo post mastectomy ER positive HER-2/neu negative completed antiestrogen therapy  DVT prophylaxis: SCDs only perioperatively Code Status: DNR, transitioned back to DNR status (which was her admission  status) on 03/18/2021 after lengthy discussion with patient daughter and husband Family Communication: None  Status is: Inpatient  Dispo: The patient is from: Home              Anticipated d/c is to: TBD              Anticipated d/c date is: >72 hours pending clinical course              Patient currently not medically stable for discharge  Consultants:   GI  Cardiology  PCCM  Palliative Care  Oncology  Procedures/Imaging:   CT chest, abd, pelvis w/ pancreatic mass, hepatic and lung lesions concerning for metastatic disease  MRI w/ multiple liver lesions c/w metastatic disease, associated with biliary and pancreatic duct obstruction, lesion in the head of the pancreas, suspected pancreatic adenocarcinoma, multiple pulmonary nodules  Echo LVEF 66-44%, grade 1 diastolic dysfunction  Antimicrobials:  Cefepime  Subjective: No acute issues or events overnight patient awake alert oriented, daughter at bedside, husband over the phone, lengthy discussion about prognosis goals of care and patient's wishes.  Patient was made DNR at bedside this morning per her wishes.  Family remains supportive but understands poor prognosis, palliative meeting later today, minimal to no urine output overnight, patient's diet remains poor, she remains markedly volume overloaded.  Objective: Vitals:   03/18/21 0100 03/18/21 0200 03/18/21 0300 03/18/21 0400  BP: (!) 110/59 (!) 112/56 110/61 (!) 107/52  Pulse: 80 82 79 77  Resp: (!) 26 (!) 26 (!) 27 (!) 24  Temp:    98.3 F (36.8 C)  TempSrc:    Axillary  SpO2: 95% 96% 96% 94%  Weight:      Height:        Intake/Output Summary (Last 24 hours) at 03/18/2021 0658 Last data filed at 03/17/2021  1832 Gross per 24 hour  Intake 1526.53 ml  Output 25 ml  Net 1501.53 ml   Filed Weights   02/16/2021 1046 03/24/2021 1123 03/17/21 2100  Weight: 98 kg 98 kg 125.4 kg    Examination:  General:  Pleasantly resting in bed, No acute distress.  Somewhat somnolent but alert oriented x4 HEENT:  Sclerae slightly icteric, noninjected.  Extraocular movements intact bilaterally. Neck:  Without mass or deformity.  Trachea is midline.  Right IJ central line noted bandage clean dry intact Lungs: Moderate bibasilar rales Heart:  Regular rate and rhythm. Without murmurs, rubs, or gallops. Abdomen:  Soft, nontender, minimally distended. Without guarding or rebound. Extremities: Without cyanosis, clubbing, moderate edema 3+ pitting bilateral upper lower and lower extremities. Vascular:  Dorsalis pedis and posterior tibial pulses palpable bilaterally. Skin:  Warm and dry, moderately jaundiced  Data Reviewed: I have personally reviewed following labs and imaging studies  CBC: Recent Labs  Lab 03/30/2021 1554 03/20/2021 0455 04/04/2021 2110 03/16/21 1642 03/17/21 0415  WBC  --  14.6* 19.0* 15.7* 15.7*  HGB 9.9* 8.6* 7.9* 7.7* 7.6*  HCT 29.0* 27.6* 25.9* 25.3* 24.8*  MCV  --  92.3 94.5 96.2 94.7  PLT  --  267 278 242 034   Basic Metabolic Panel: Recent Labs  Lab 03/13/21 0157 03/13/21 0540 03/29/2021 0455 03/16/21 0300 03/16/21 1642 03/17/21 0415 03/17/21 1420  NA 129*   < > 135 138 136 133* 132*  K 4.9   < > 5.6* 5.8* 5.4* 5.4* 5.4*  CL 104   < > 106 110 109 101 101  CO2 16*   < > 19* 16* 16* 21* 20*  GLUCOSE 221*   < > 216* 228* 203* 210* 228*  BUN 44*   < > 56* 69* 78* 78* 85*  CREATININE 1.43*   < > 2.28* 2.92* 3.56* 3.71* 4.16*  CALCIUM 8.6*   < > 8.4* 8.2* 8.2* 7.6* 7.6*  MG 1.9  --   --   --   --   --   --   PHOS 3.6  --   --   --   --   --   --    < > = values in this interval not displayed.   GFR: Estimated Creatinine Clearance: 16 mL/min (A) (by C-G formula based on SCr of 4.16  mg/dL (H)). Liver Function Tests: Recent Labs  Lab 03/25/2021 0438 04/03/2021 0455 03/16/21 0300 03/16/21 1642 03/17/21 0415  AST 268* 238* 143* 113* 78*  ALT 243* 205* 173* 159* 132*  ALKPHOS 1,633* 1,605* 1,405* 1,337* 1,133*  BILITOT 16.7* 20.0* 16.1* 12.8* 9.2*  PROT 5.8* 5.8* 5.7* 6.0* 5.4*  ALBUMIN 1.8* 2.0* 2.2* 2.2* 1.9*   Recent Labs  Lab 04/08/2021 2110 03/16/21 0300  LIPASE 45 51   No results for input(s): AMMONIA in the last 168 hours. Coagulation Profile: Recent Labs  Lab 04/08/2021 0438 03/25/2021 0455 03/16/21 0300 03/16/21 1642 03/17/21 0415  INR 1.2 1.1 1.2 1.2 1.2   Cardiac Enzymes: No results for input(s): CKTOTAL, CKMB, CKMBINDEX, TROPONINI in the last 168 hours. BNP (last 3 results) No results for input(s): PROBNP in the last 8760 hours. HbA1C: No results for input(s): HGBA1C in the last 72 hours. CBG: Recent Labs  Lab 03/17/21 0803 03/17/21 1143 03/17/21 1409 03/17/21 1607 03/17/21 2149  GLUCAP 219* 208* 217* 205* 201*   Lipid Profile: No results for input(s): CHOL, HDL, LDLCALC, TRIG, CHOLHDL, LDLDIRECT in the last 72 hours. Thyroid Function Tests: No results for input(s): TSH, T4TOTAL, FREET4, T3FREE, THYROIDAB in the last 72 hours. Anemia Panel: No results for input(s): VITAMINB12, FOLATE, FERRITIN, TIBC, IRON, RETICCTPCT in the last 72 hours. Sepsis Labs: Recent Labs  Lab 03/21/2021 0455 04/08/2021 1714 03/11/2021 2110 03/17/21 0415  PROCALCITON 19.15  --   --   --   LATICACIDVEN  --  0.8 0.7 0.9    Recent Results (from the past 240 hour(s))  Resp Panel by RT-PCR (Flu A&B, Covid) Nasopharyngeal Swab     Status: None   Collection Time: 03/09/2021 11:40 AM   Specimen: Nasopharyngeal Swab; Nasopharyngeal(NP) swabs in vial transport medium  Result Value Ref Range Status   SARS Coronavirus 2 by RT PCR NEGATIVE NEGATIVE Final    Comment: (NOTE) SARS-CoV-2 target nucleic acids are NOT DETECTED.  The SARS-CoV-2 RNA is generally detectable in  upper respiratory specimens during the acute phase of infection. The lowest concentration of SARS-CoV-2 viral copies this assay can detect is 138 copies/mL. A negative result does not preclude SARS-Cov-2 infection and should not be used as the sole basis for treatment or other patient management decisions. A negative result may occur with  improper specimen collection/handling, submission of specimen other than nasopharyngeal swab, presence of viral mutation(s) within the areas targeted by this assay, and inadequate number of viral copies(<138 copies/mL). A negative result must be combined with clinical observations, patient history, and epidemiological information. The expected result is Negative.  Fact Sheet for Patients:  EntrepreneurPulse.com.au  Fact Sheet for Healthcare Providers:  IncredibleEmployment.be  This test is no t yet approved or cleared by the [place] FDA and  has been authorized for detection and/or  diagnosis of SARS-CoV-2 by FDA under an Emergency Use Authorization (EUA). This EUA will remain  in effect (meaning this test can be used) for the duration of the COVID-19 declaration under Section 564(b)(1) of the Act, 21 U.S.C.section 360bbb-3(b)(1), unless the authorization is terminated  or revoked sooner.       Influenza A by PCR NEGATIVE NEGATIVE Final   Influenza B by PCR NEGATIVE NEGATIVE Final    Comment: (NOTE) The Xpert Xpress SARS-CoV-2/FLU/RSV plus assay is intended as an aid in the diagnosis of influenza from Nasopharyngeal swab specimens and should not be used as a sole basis for treatment. Nasal washings and aspirates are unacceptable for Xpert Xpress SARS-CoV-2/FLU/RSV testing.  Fact Sheet for Patients: EntrepreneurPulse.com.au  Fact Sheet for Healthcare Providers: IncredibleEmployment.be  This test is not yet approved or cleared by the [place] FDA and has been  authorized for detection and/or diagnosis of SARS-CoV-2 by FDA under an Emergency Use Authorization (EUA). This EUA will remain in effect (meaning this test can be used) for the duration of the COVID-19 declaration under Section 564(b)(1) of the Act, 21 U.S.C. section 360bbb-3(b)(1), unless the authorization is terminated or revoked.  Performed at [facility], [address] [place], [place] [address]   MRSA PCR Screening     Status: None   Collection Time: 03/31/2021  4:51 PM   Specimen: Nasal Mucosa; Nasopharyngeal  Result Value Ref Range Status   MRSA by PCR NEGATIVE NEGATIVE Final    Comment:        The GeneXpert MRSA Assay (FDA approved for NASAL specimens only), is one component of a comprehensive MRSA colonization surveillance program. It is not intended to diagnose MRSA infection nor to guide or monitor treatment for MRSA infections. Performed at [facility], [place] [address] [place], [place] [address]     Radiology Studies: No results found. Scheduled Meds: . albuterol  2.5 mg Nebulization BID  . B-complex with vitamin C  1 tablet Oral Daily  . budesonide  0.5 mg Nebulization BID  . calcium carbonate  500 mg of elemental calcium Oral Q breakfast  . chlorhexidine  15 mL Mouth Rinse BID  . Chlorhexidine Gluconate Cloth  6 each Topical Daily  . diltiazem  90 mg Oral Q6H  . fluticasone  1 spray Each Nare BID  . guaiFENesin  600 mg Oral BID  . insulin aspart  0-15 Units Subcutaneous TID WC  . insulin aspart  0-5 Units Subcutaneous QHS  . insulin glargine  12 Units Subcutaneous Daily  . loratadine  10 mg Oral Daily  . mouth rinse  15 mL Mouth Rinse q12n4p  . midodrine  10 mg Oral BID WC  . montelukast  10 mg Oral Daily  . multivitamin with minerals  1 tablet Oral Daily  . pantoprazole  40 mg Oral Daily  . sodium chloride flush  10-40 mL Intracatheter Q12H  . sodium chloride flush  3 mL Intravenous Q12H  . sodium zirconium  cyclosilicate  10 g Oral BID  . [START ON 03/26/2021] Vitamin D (Ergocalciferol)  50,000 Units Oral Q14 Days  . vitamin E  100 Units Oral Daily   Continuous Infusions: . ceFEPime (MAXIPIME) IV Stopped (03/17/21 1738)  . metronidazole Stopped (03/17/21 2225)  . norepinephrine (LEVOPHED) Adult infusion Stopped (03/17/21 0647)    LOS: 10 days   Time spent: 44mn  Azrael Maddix C Quinton Voth, DO [facility]  If 7PM-7AM, please contact night-coverage [email or web address]  03/18/2021, 6:58 AM

## 2021-03-18 NOTE — Progress Notes (Signed)
Palliative Care Brief Note  Discussed today with patient and her daughter (via phone).    Daughter understands situation and requesting nephrology input.  I passed this request to primary team.  We are planning for family meeting tomorrow at 0900.  Full consult note to follow.  Micheline Rough, MD [facility] [phone]

## 2021-03-18 NOTE — Consult Note (Signed)
Consultation Note Date: 03/19/2021   Patient Name: Laura Sherman  DOB: Jan 16, 1951  MRN: [id number]  Age / Sex: 70 y.o., female  PCP: Reita Cliche, MD Referring Physician: Little Ishikawa, MD  Reason for Consultation: Establishing goals of care  HPI/Patient Profile: 70 y.o. female  with past medical history of endometrial cancer, breast cancer, hypertension, GERD, diabetes, asthma admitted on 02/16/2021 with weakness and anorexia with fatigue and jaundice.  Work-up revealed malignant cells consistent with adenocarcinoma.  She had an ERCP with stent placement on 5/5.  She had hypotension following this requiring pressor support as well as BiPAP.  She has now developed renal failure as well.  Palliative consulted for goals of care.  Clinical Assessment and Goals of Care: I met today with Ms. Roan.  She is awake and alert and sitting in bedside chair.  Following conversation with her, I also called discussed with her daughter, Jenny Reichmann, via phone.  I introduced palliative care as specialized medical care for people living with serious illness. It focuses on providing relief from the symptoms and stress of a serious illness. The goal is to improve quality of life for both the patient and the family.  We discussed clinical course as well as wishes moving forward in regard to advanced directives. Values and goals of care important to patient and family were attempted to be elicited.  We discussed her understanding of her condition with finding of new diagnosis of adenocarcinoma with metastatic disease.  Also discussed significant liver impairment due to obstruction that does appear to be improving following stent placement but that she is now also developed renal failure.  She asked me to call and discuss further with her daughter, Jenny Reichmann, who is an Air traffic controller in emergency room at [employer]  location.  Cindy and I discussed overall clinical course this admission and concern that renal impairment may be the most pressing factor in her multisystem organ failure.  She requested input from nephrology and we discussed plan to meet again following nephrology evaluation.  Concept of Hospice and Palliative Care were discussed  Questions and concerns addressed.   PMT will continue to support holistically.  SUMMARY OF RECOMMENDATIONS   -Full code/full scope treatment -Patient and family requesting nephrology input.  I passed this request along to primary team.  Code Status/Advance Care Planning:  Full code  Prognosis:   Guarded  Discharge Planning: To Be Determined      Primary Diagnoses: Present on Admission: **None**   I have reviewed the medical record, interviewed the patient and family, and examined the patient. The following aspects are pertinent.  Past Medical History:  Diagnosis Date  . Arthritis   . Asthma   . Breast cancer ([place]) 2005   chemo, XRT  . Diabetes mellitus without complication ([place])   . Environmental allergies   . GERD (gastroesophageal reflux disease)   . Hypertension   . Uterine cancer ([place]) 2010   total hysterectomy, no chemo or XRT   Social History   Socioeconomic History  .  Marital status: Married    Spouse name: Not on file  . Number of children: Not on file  . Years of education: Not on file  . Highest education level: Not on file  Occupational History  . Not on file  Tobacco Use  . Smoking status: Never Smoker  . Smokeless tobacco: Never Used  Vaping Use  . Vaping Use: Never used  Substance and Sexual Activity  . Alcohol use: No  . Drug use: No  . Sexual activity: Not on file  Other Topics Concern  . Not on file  Social History Narrative  . Not on file   Social Determinants of Health   Financial Resource Strain: Not on file  Food Insecurity: Not on file  Transportation Needs: Not on file  Physical Activity:  Not on file  Stress: Not on file  Social Connections: Not on file   Family History  Problem Relation Age of Onset  . COPD Mother    Scheduled Meds: . albuterol  2.5 mg Nebulization BID  . B-complex with vitamin C  1 tablet Oral Daily  . budesonide  0.5 mg Nebulization BID  . calcium carbonate  500 mg of elemental calcium Oral Q breakfast  . chlorhexidine  15 mL Mouth Rinse BID  . Chlorhexidine Gluconate Cloth  6 each Topical Daily  . diltiazem  90 mg Oral Q6H  . fluticasone  1 spray Each Nare BID  . guaiFENesin  600 mg Oral BID  . insulin aspart  0-15 Units Subcutaneous TID WC  . insulin aspart  0-5 Units Subcutaneous QHS  . insulin glargine  12 Units Subcutaneous Daily  . loratadine  10 mg Oral Daily  . mouth rinse  15 mL Mouth Rinse q12n4p  . midodrine  10 mg Oral BID WC  . montelukast  10 mg Oral Daily  . multivitamin with minerals  1 tablet Oral Daily  . pantoprazole  40 mg Oral Daily  . sodium chloride flush  10-40 mL Intracatheter Q12H  . sodium chloride flush  3 mL Intravenous Q12H  . [START ON 03/26/2021] Vitamin D (Ergocalciferol)  50,000 Units Oral Q14 Days  . vitamin E  100 Units Oral Daily   Continuous Infusions: . ceFEPime (MAXIPIME) IV Stopped (03/18/21 1848)  . metronidazole Stopped (03/19/21 1093)  . norepinephrine (LEVOPHED) Adult infusion Stopped (03/17/21 0647)   PRN Meds:.acetaminophen, albuterol, guaiFENesin-dextromethorphan, ondansetron **OR** ondansetron (ZOFRAN) IV, pseudoephedrine, sodium chloride flush Medications Prior to Admission:  Prior to Admission medications   Medication Sig Start Date End Date Taking? Authorizing Provider  acetaminophen (TYLENOL) 650 MG CR tablet Take 650 mg by mouth every 8 (eight) hours as needed for pain.   Yes [provider]  albuterol (VENTOLIN HFA) 108 (90 Base) MCG/ACT inhaler Inhale 2 puffs into the lungs every 6 (six) hours as needed for wheezing or shortness of breath.   Yes [provider]   amLODipine (NORVASC) 10 MG tablet Take 10 mg by mouth daily.   Yes [provider]  atorvastatin (LIPITOR) 20 MG tablet Take 20 mg by mouth daily.   Yes [provider]  B Complex Vitamins (VITAMIN B COMPLEX PO) Take 1 tablet by mouth daily.   Yes [provider]  calcium carbonate (OSCAL) 1500 (600 Ca) MG TABS tablet Take 600 mg of elemental calcium by mouth daily.   Yes [provider]  diclofenac (VOLTAREN) 75 MG EC tablet Take 75 mg by mouth 2 (two) times daily.   Yes [provider]  ergocalciferol (VITAMIN D2) 1.25 MG (50000 UT) capsule Take 50,000 Units by mouth every 14 (fourteen) days. 08/12/16  Yes [provider]  fexofenadine (ALLEGRA) 60 MG tablet Take 60 mg by mouth daily.   Yes [provider]  fluticasone (FLONASE) 50 MCG/ACT nasal spray Place 1 spray into both nostrils daily.   Yes [provider]  fluticasone (FLOVENT DISKUS) 50 MCG/BLIST diskus inhaler Inhale 1 puff into the lungs 2 (two) times daily.   Yes [provider]  glipiZIDE (GLUCOTROL) 5 MG tablet Take 2.5 mg by mouth daily. 02/20/21  Yes [provider]  hydrochlorothiazide (HYDRODIURIL) 25 MG tablet Take 25 mg by mouth daily.   Yes [provider]  loratadine (CLARITIN) 10 MG tablet Take 10 mg by mouth daily.   Yes [provider]  losartan-hydrochlorothiazide (HYZAAR) 100-25 MG tablet Take 1 tablet by mouth daily.   Yes [provider]  metFORMIN (GLUCOPHAGE) 500 MG tablet Take by mouth 2 (two) times daily with a meal.   Yes [provider]  metoprolol (LOPRESSOR) 100 MG tablet Take 100 mg by mouth 2 (two) times daily.   Yes [provider]  montelukast (SINGULAIR) 10 MG tablet Take 10 mg by mouth daily. 02/24/21  Yes [provider]  Multiple Vitamin (MULTIVITAMIN WITH MINERALS) TABS tablet Take 1 tablet by mouth daily.   Yes [provider]  olmesartan (BENICAR) 40  MG tablet Take 40 mg by mouth daily. 01/08/21  Yes [provider]  omeprazole (PRILOSEC) 10 MG capsule Take 10 mg by mouth daily.   Yes [provider]  oxymetazoline (AFRIN) 0.05 % nasal spray Place 1 spray into both nostrils daily as needed for congestion.   Yes [provider]  pioglitazone (ACTOS) 30 MG tablet Take 30 mg by mouth daily.   Yes [provider]  Pseudoephedrine-Guaifenesin [phone] MG TB12 Take 1 tablet by mouth 2 (two) times daily.   Yes [provider]  Vitamin E 100 units TABS Take 100 Units by mouth daily.   Yes [provider]   Allergies  Allergen Reactions  . Erythromycin Nausea Only    Other Reactions: GI Upset Other Reactions: GI Upset   . Penicillins Rash  . Sulfa Antibiotics Nausea Only    Other Reactions: GI Upset  . Tape Rash    Bandaid   Review of Systems  Constitutional: Positive for activity change and fatigue.  HENT:       Icterus  Cardiovascular: Positive for leg swelling.  Gastrointestinal: Positive for abdominal distention and nausea.  Endocrine:       Decreased urine output  Neurological: Positive for weakness.  Psychiatric/Behavioral: Positive for sleep disturbance.   Physical Exam  General: Awake, alert no distress, sitting in bedside chair Lungs: Clear bilaterally, no wheezing Heart: Regular rate and rhythm, no murmur Abdomen: Soft, nontender Extremity: Significant edema Neuro: Alert, no asterixis focal findings   Vital Signs: BP (!) 118/52 (BP Location: Right Wrist)   Pulse 79   Temp 97.6 F (36.4 C) (Oral)   Resp (!) 23   Ht 5' 3"  (1.6 m)   Wt 124.4 kg   SpO2 94%   BMI 48.58 kg/m  Pain Scale: 0-10 POSS *See Group Information*: 1-Acceptable,Awake and alert Pain Score: 0-No pain   SpO2: SpO2: 94 % O2 Device:SpO2: 94 % O2 Flow Rate: .O2 Flow Rate (L/min): 2 L/min  IO: Intake/output summary:   Intake/Output Summary (Last 24 hours) at 03/19/2021 1033 Last data  filed  at 03/19/2021 4199 Gross per 24 hour  Intake 614.88 ml  Output --  Net 614.88 ml    LBM: Last BM Date: 03/17/21 Baseline Weight: Weight: 98 kg Most recent weight: Weight: 124.4 kg     Palliative Assessment/Data:   Flowsheet Rows   Flowsheet Row Most Recent Value  Intake Tab   Referral Department Hospitalist  Unit at Time of Referral ICU  Palliative Care Primary Diagnosis Cancer  Date Notified 03/16/2021  Palliative Care Type New Palliative care  Reason for referral Clarify Goals of Care  Date of Admission 02/12/2021  Date first seen by Palliative Care 03/17/21  # of days Palliative referral response time 2 Day(s)  # of days IP prior to Palliative referral 7  Clinical Assessment   Palliative Performance Scale Score 40%  Psychosocial & Spiritual Assessment   Palliative Care Outcomes   Patient/Family meeting held? Yes  Who was at the meeting? Patient, daughter via phone  Palliative Care Outcomes Clarified goals of care      Time In: 1300 Time Out: 1400 Time Total: 60 Greater than 50%  of this time was spent counseling and coordinating care related to the above assessment and plan.  Signed by: Micheline Rough, MD   Please contact Palliative Medicine Team phone at [phone] for questions and concerns.  For individual provider: See Shea Evans

## 2021-03-18 NOTE — Progress Notes (Signed)
NAME:  Laura Sherman, MRN:  [id number], DOB:  1951-07-25, LOS: 76 ADMISSION DATE:  15-Mar-2021, CONSULTATION DATE:  03/17/2021 REFERRING MD:  Dr. Avon Sherman, CHIEF COMPLAINT:  Hypotension   History of Present Illness:  Laura Sherman is a 70 y.o. F with PMH significant for uterine cancer, breast cancer, HTN, GERD, type II diabetes, and asthma who presented 4/28 with complaints of failure to thrive, anorexia, and fatigue. ON ED arrival she was seen with AKI, hyperkalemia, and jaundice with recent diagnosis of metastatic liver disease. She was admitted per [facility] with GI consult.   Thus far during hospitalization patient has underwent attempted ERCP 5/4 (initially delayed due to coagulapathy) that was unsuccessful. FNA of liver lesion pathology revealed malignant cells consistent with adenocarcinoma.  Repeat ERCP with stent was completed 5/5. Post Repeat ERCP patient was seen with hypotension requiring pressor support and respiratory distress requiring BIPAP therapy. PCCM consulted for further assistance in management.   Pertinent  Medical History  Uterine cancer, breast cancer, HTN, GERD, type II diabetes, and asthma  Significant Hospital Events: Including procedures, antibiotic start and stop dates in addition to other pertinent events   . 4/28 Admitted with complaints of failure to thrive, anorexia, and fatigue . 5/1 vitamin K given for persistent coagulapathy   . 5/4 liver bx adenocarcinoma . 5/4 Attempted ERCP was unsuccessful  . 5/5 Repeat ERCP with stent was completed 5/5. Post Repeat ERCP patient was seen with hypotension requiring pressor support and respiratory distress requiring BIPAP therapy . Nephrology eval     Scheduled Meds: . albuterol  2.5 mg Nebulization BID  . B-complex with vitamin C  1 tablet Oral Daily  . budesonide  0.5 mg Nebulization BID  . calcium carbonate  500 mg of elemental calcium Oral Q breakfast  . chlorhexidine  15 mL Mouth Rinse BID  . Chlorhexidine  Gluconate Cloth  6 each Topical Daily  . diltiazem  90 mg Oral Q6H  . fluticasone  1 spray Each Nare BID  . guaiFENesin  600 mg Oral BID  . insulin aspart  0-15 Units Subcutaneous TID WC  . insulin aspart  0-5 Units Subcutaneous QHS  . insulin glargine  12 Units Subcutaneous Daily  . loratadine  10 mg Oral Daily  . mouth rinse  15 mL Mouth Rinse q12n4p  . midodrine  10 mg Oral BID WC  . montelukast  10 mg Oral Daily  . multivitamin with minerals  1 tablet Oral Daily  . pantoprazole  40 mg Oral Daily  . sodium chloride flush  10-40 mL Intracatheter Q12H  . sodium chloride flush  3 mL Intravenous Q12H  . sodium zirconium cyclosilicate  10 g Oral BID  . [START ON 03/26/2021] Vitamin D (Ergocalciferol)  50,000 Units Oral Q14 Days  . vitamin E  100 Units Oral Daily   Continuous Infusions: . ceFEPime (MAXIPIME) IV Stopped (03/17/21 1738)  . metronidazole Stopped (03/18/21 1443)  . norepinephrine (LEVOPHED) Adult infusion Stopped (03/17/21 0647)   PRN Meds:.acetaminophen, albuterol, guaiFENesin-dextromethorphan, ondansetron **OR** ondansetron (ZOFRAN) IV, pseudoephedrine, sodium chloride flush    Interim History / Subjective:  Comfortable at present   Objective   Blood pressure 127/60, pulse 82, temperature 98.2 F (36.8 C), temperature source Oral, resp. rate (!) 26, height 5\' 3"  (1.6 m), weight 124.4 kg, SpO2 95 %.        Intake/Output Summary (Last 24 hours) at 03/18/2021 1645 Last data filed at 03/18/2021 1445 Gross per 24 hour  Intake 1604.42 ml  Output 20 ml  Net 1584.42 ml   Filed Weights   03/20/2021 1800 03/17/21 2100 03/18/21 1119  Weight: 117.6 kg 125.4 kg 124.4 kg    Examination:  tmax 98.8 on Maxepime and flagyl  Day 3/x  On 02 2lpm with sats 94%  General appearance:   More chronically than acutely ill   No jvd Oropharynx clear,  mucosa nl Neck supple Lungs with minimalexp > insp rhonchi bilaterally RRR no s3 or or sign murmur Abd mod distended / poor excursion   Extr warm with 1+ pitting edema Neuro  Sensorium intact,  no apparent motor deficits         Labs/imaging that I havepersonally reviewed     Resolved Hospital Problem list     Assessment & Plan:   Obstructive jaundice with CT evidence of pancreatic malignancy s/p ERCP with stent.  FNA of liver with metastatic Adenocarcinoma with likely lung mets also Elevated LFTs Coagulapathy, improved  -GI following  >>>  Palliative Care > DNR established 5/8   Hypotension/ resolved       Acute Hypoxic Respiratory Failure  Developed post repeat ERCP, felt secondary to sedation.  -PRN BiPAP for high work of breathing  -aspiration precautions  -wean O2 for sats >90% -pulmonary hygiene as able  -minimize sedation   Paroxysmal SVT/PAT Hx of HTN  ECHO with EF 70-75% with grade 1 diastolic dysfunction  -Cardiology following   -tele monitoring  -on beta blocker, CCB -follow daily weights   Acute Kidney Injury  Hyperkalemia  Metabolic acidosis  Lab Results  Component Value Date   CREATININE 4.72 (H) 03/18/2021   CREATININE 4.16 (H) 03/17/2021   CREATININE 3.71 (H) 03/17/2021   Patient initally presented with AKI, creatinine 2.38 on admisison. AKI improved with hydration but creatinine again elevated 2.28 5/5. Patient did receive lasix x1 5/4  -Trend BMP / urinary output -Replace electrolytes as indicated -Avoid nephrotoxic agents, ensure adequate renal perfusion - HC03 drip added pm 5/6 and HC03 up to 21 am 5/7 > continue for now >>> Foley placed 5/7 >>> nephrology eval  5/7 added milrinone/ bid lokelma     DM II  -SSI  -per primary    Best practice   Diet:  NPO while on BIPAP, advance per GI Pain/Anxiety/Delirium protocol (if indicated): No VAP protocol (if indicated): Not indicated DVT prophylaxis: Contraindicated GI prophylaxis: PPI Glucose control:  SSI Yes Central venous access:  Yes, and it is still needed Arterial line:  N/A Foley:  Yes, and it is still  needed Mobility:  OOB  PT consulted: Yes Last date of multidisciplinary goals of care discussion: Per primary  Code Status:  DNR Disposition: SDU      Pt now ncb/ PCCM service is PRN   Laura Gully, MD Pulmonary and [place] [phone]   After 7:00 pm call Elink  [phone]

## 2021-03-18 NOTE — Progress Notes (Signed)
Daily Progress Note   Patient Name: Laura Sherman       Date: 03/18/2021 DOB: 1951-08-12  Age: 70 y.o. MRN#: [id number] Attending Physician: Little Ishikawa, MD Primary Care Physician: Reita Cliche, MD Admit Date: 02/26/2021  Reason for Consultation/Follow-up: Establishing goals of care  Subjective: I met today with Laura Sherman and her daughter, Jenny Reichmann.  We reviewed clinical course including newly diagnosed metastatic adenocarcinoma, obstructive jaundice, and renal failure.  We discussed options for care moving forward and concern that progressive renal failure seems to be the primary driving factor at this point in time.  I reached out was able to talk with nephrology and while she is not currently at a point to consider renal replacement therapy, she could very quickly be approaching this.  Consideration is that this is likely AKI from shock versus ATN related to bilirubin nephrotoxicity versus retention versus other.  She has a renal ultrasound that is pending.  With this in mind, nephrology feels that it may be something that does have a potential to improve with continued supportive care (including potential need for CRRT) over time.  I talked with her daughter who reports understanding the overall situation with newly diagnosed metastatic adenocarcinoma.  She is hopeful that her mother will improve enough to be able to be out of the hospital and have some quality time at home.  Therefore, she believes that if this is a potentially reversible renal issue with continued aggressive care, her mother would likely want to pursue short-term interventions such as renal replacement if offered.  She also understands that this is not fact overall severity of illness with newly diagnosed  cancer.  Discussed plan to follow-up again tomorrow.  Length of Stay: 10  Current Medications: Scheduled Meds:  . albuterol  2.5 mg Nebulization BID  . B-complex with vitamin C  1 tablet Oral Daily  . budesonide  0.5 mg Nebulization BID  . calcium carbonate  500 mg of elemental calcium Oral Q breakfast  . chlorhexidine  15 mL Mouth Rinse BID  . Chlorhexidine Gluconate Cloth  6 each Topical Daily  . diltiazem  90 mg Oral Q6H  . fluticasone  1 spray Each Nare BID  . guaiFENesin  600 mg Oral BID  . insulin aspart  0-15 Units Subcutaneous TID WC  . insulin  aspart  0-5 Units Subcutaneous QHS  . insulin glargine  12 Units Subcutaneous Daily  . loratadine  10 mg Oral Daily  . mouth rinse  15 mL Mouth Rinse q12n4p  . midodrine  10 mg Oral BID WC  . montelukast  10 mg Oral Daily  . multivitamin with minerals  1 tablet Oral Daily  . pantoprazole  40 mg Oral Daily  . sodium chloride flush  10-40 mL Intracatheter Q12H  . sodium chloride flush  3 mL Intravenous Q12H  . sodium zirconium cyclosilicate  10 g Oral BID  . [START ON 03/26/2021] Vitamin D (Ergocalciferol)  50,000 Units Oral Q14 Days  . vitamin E  100 Units Oral Daily    Continuous Infusions: . ceFEPime (MAXIPIME) IV 2 g (03/18/21 1818)  . metronidazole Stopped (03/18/21 1443)  . norepinephrine (LEVOPHED) Adult infusion Stopped (03/17/21 0647)    PRN Meds: acetaminophen, albuterol, guaiFENesin-dextromethorphan, ondansetron **OR** ondansetron (ZOFRAN) IV, pseudoephedrine, sodium chloride flush  Physical Exam   General: Sleepy but able to listen and somewhat participate in conversation, in no acute distress.  HEENT: No bruits, no goiter, no JVD, sclera somewhat icteric, IJ noted Heart: Regular rate and rhythm. No murmur appreciated. Lungs: Good air movement, clear Abdomen: Soft, nontender, distended, positive bowel sounds.  Ext: Significant edema Skin: Warm and dry  Vital Signs: BP 127/60   Pulse 82   Temp 98.8 F  (37.1 C) (Axillary)   Resp (!) 26   Ht 5\' 3"  (1.6 m)   Wt 124.4 kg   SpO2 95%   BMI 48.58 kg/m  SpO2: SpO2: 95 % O2 Device: O2 Device: Nasal Cannula O2 Flow Rate: O2 Flow Rate (L/min): 2 L/min  Intake/output summary:   Intake/Output Summary (Last 24 hours) at 03/18/2021 1840 Last data filed at 03/18/2021 1445 Gross per 24 hour  Intake 780 ml  Output 20 ml  Net 760 ml   LBM: Last BM Date: 03/17/21 Baseline Weight: Weight: 98 kg Most recent weight: Weight: 124.4 kg       Palliative Assessment/Data:      Patient Active Problem List   Diagnosis Date Noted  . Acute liver failure without hepatic coma   . Malignant neoplasm metastatic to both lungs (HCC)   . Malignant neoplasm of pancreas (HCC)   . Acidosis   . Shock circulatory (HCC)   . Obstructive jaundice due to malignant neoplasm (HCC) 03/10/2021  . Acute kidney injury (HCC) 03/04/2021  . Hyperkalemia 03/07/2021  . DM type 2 (diabetes mellitus, type 2) (HCC) 02/20/2021  . Coagulopathy (HCC) 02/11/2021  . Aortic atherosclerosis (HCC) 02/13/2021  . Hypercalcemia 02/15/2021    Palliative Care Assessment & Plan   Patient Profile: 70 year old female admitted with obstructive jaundice and found to have metastatic adenocarcinoma with involvement of the liver, peritoneum, lung, and mediastinal adenopathy.  Her clinical course has worsened with renal failure.  Palliative consulted for goals of care.  Recommendations/Plan:  DNR/DNI  Continue current interventions.  Overall, family is working to process the fact that she has newly diagnosed terminal disease.  While they understand she is critically ill and this is not curable, they also want to continue with medical interventions that may allow her to be well enough to be out of the hospital to have time at home with family.  We will continue to discuss based on her clinical course of the next couple of days.  At this point, they are open to all interventions up until point of  cardiac or respiratory arrest.  If things such as renal replacement offer potential for longer-term improvement in kidney function that would allow her to have more quality time at home, they would want these interventions.  Goals of Care and Additional Recommendations:  Limitations on Scope of Treatment: Full Scope Treatment until point of cardiac or respiratory arrest  Code Status:    Code Status Orders  (From admission, onward)         Start     Ordered   03/18/21 0847  Do not attempt resuscitation (DNR)  Continuous       Question Answer Comment  In the event of cardiac or respiratory ARREST Do not call a "code blue"   In the event of cardiac or respiratory ARREST Do not perform Intubation, CPR, defibrillation or ACLS   In the event of cardiac or respiratory ARREST Use medication by any route, position, wound care, and other measures to relive pain and suffering. May use oxygen, suction and manual treatment of airway obstruction as needed for comfort.      03/18/21 0846        Code Status History    Date Active Date Inactive Code Status Order ID Comments User Context   03/29/2021 0903 03/18/2021 0846 Full Code [id number]  Little Ishikawa, MD Inpatient   03/09/2021 1913 03/17/2021 0902 DNR [id number]  Samuella Cota, MD Inpatient   Advance Care Planning Activity       Prognosis:  Guarded/Poor  Discharge Planning:  To Be Determined  Care plan was discussed with patient, daughter  Thank you for allowing the Palliative Medicine Team to assist in the care of this patient.   Time In: 0900 Time Out: 0950 Total Time 50 Prolonged Time Billed No      Greater than 50%  of this time was spent counseling and coordinating care related to the above assessment and plan.  Micheline Rough, MD  Please contact Palliative Medicine Team phone at [phone] for questions and concerns.

## 2021-03-18 NOTE — Consult Note (Signed)
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  Chief Complaint  Patient presents with  . Weakness    HPI: Very pleasant 70 patient with past medical history significant for breast cancer and endometrial cancer in remission, currently admitted with obstructive jaundice, fatigue and weight loss. She has been making hospital, CT chest abdomen pelvis with contrast which suggested multiple pulmonary nodules consistent with metastatic disease, multiple ill-defined hypoechoic lesion is noted Dependent, concerning for metastatic disease, largest lesion measuring 5.2 cm in the right hepatic lobe, possibly well-defined low-density seen in pancreatic head which measures 2.7 cm and is concerning for possible pancreatic malignancy.  Reviewed the metastatic disease.  Mild intrahepatic biliary dilatation noted which is due to obstruction secondary to this mass.  She had endoscopic ultrasound-guided FNA biopsy of the pancreatic mass and liver mass on 03/31/2021 and pathology from this showed malignant cells consistent with adenocarcinoma of the pancreatic lesion and liver lesion.  She had an ERCP with stent placement on 03/31/2021 with moderate hypotension and respiratory distress and transferred to ICU for further care.  She has needed intermittent pressors for hypotension and currently has AKI on CKD. Her jaundice has improved status post stent placement which continues to significant jaundice. Her daughter [name] was at bedside today,  I also spoke to Mr. Laura Sherman her husband on the phone. Patient is alert but keeps her eyes closed, answers appropriately to some questions.  She remained silent for some of the questions. She denies any pain but tells me that she is very tired.  She understands that she has metastatic pancreatic cancer.  No nausea, vomiting, diarrhea,  fevers. According to the daughter, patient was doing well up until a week or 2 prior to admission and she was found to have jaundice and severe fatigue which prompted a hospital admission.   She is independent prior to this hospitalization but has please including diabetes, hypertension and limited mobility.  Rest of the pertinent review of systems negative.   Past Medical History:  Diagnosis Date  . Arthritis   . Asthma   . Breast cancer ([place]) 2005   chemo, XRT  . Diabetes mellitus without complication ([place])   . Environmental allergies   . GERD (gastroesophageal reflux disease)   . Hypertension   . Uterine cancer ([place]) 2010   total hysterectomy, no chemo or XRT  :  Past Surgical History:  Procedure Laterality Date  . ABDOMINAL HYSTERECTOMY    . BACK SURGERY    . BILIARY STENT PLACEMENT N/A 03/28/2021   Procedure: BILIARY STENT PLACEMENT;  Surgeon: Clarene Essex, MD;  Location: [facility];  Service: Endoscopy;  Laterality: N/A;  . BREAST SURGERY  2005   chemo, XRT  . ENDOSCOPIC RETROGRADE CHOLANGIOPANCREATOGRAPHY (ERCP) WITH PROPOFOL N/A 03/27/2021   Procedure: ENDOSCOPIC RETROGRADE CHOLANGIOPANCREATOGRAPHY (ERCP) WITH PROPOFOL;  Surgeon: Arta Silence, MD;  Location: [facility];  Service: Endoscopy;  Laterality: N/A;  . ENDOSCOPIC RETROGRADE CHOLANGIOPANCREATOGRAPHY (ERCP) WITH PROPOFOL N/A 04/06/2021   Procedure: ENDOSCOPIC RETROGRADE CHOLANGIOPANCREATOGRAPHY (ERCP) WITH PROPOFOL;  Surgeon: Ronnette Juniper, MD;  Location: [facility];  Service: Gastroenterology;  Laterality: N/A;  . ENDOSCOPIC RETROGRADE CHOLANGIOPANCREATOGRAPHY (ERCP) WITH PROPOFOL N/A 03/24/2021   Procedure: ENDOSCOPIC RETROGRADE CHOLANGIOPANCREATOGRAPHY (ERCP) WITH PROPOFOL;  Surgeon: Clarene Essex, MD;  [facility]: [facility];  Service: Endoscopy;  Laterality: N/A;  . ESOPHAGOGASTRODUODENOSCOPY (EGD) WITH PROPOFOL N/A 03/19/2021   Procedure: ESOPHAGOGASTRODUODENOSCOPY (EGD) WITH  PROPOFOL;  Surgeon: Arta Silence, MD;   Location: [facility];  Service: Endoscopy;  Laterality: N/A;  . EUS N/A 03/13/2021   Procedure: FULL UPPER ENDOSCOPIC ULTRASOUND (EUS) RADIAL;  Surgeon: Arta Silence, MD;  Location: [facility];  Service: Endoscopy;  Laterality: N/A;  . FINE NEEDLE ASPIRATION  04/07/2021   Procedure: FINE NEEDLE ASPIRATION;  Surgeon: Arta Silence, MD;  Location: [facility];  Service: Endoscopy;;  . Joan Mayans  03/27/2021   Procedure: SPHINCTEROTOMY;  Surgeon: Arta Silence, MD;  Location: [facility];  Service: Endoscopy;;  pre-cut  . SPHINCTEROTOMY  03/20/2021   Procedure: SPHINCTEROTOMY;  Surgeon: Clarene Essex, MD;  Location: [facility];  Service: Endoscopy;;  . TONSILLECTOMY    :  Current Facility-Administered Medications  Medication Dose Route Frequency Provider Last Rate Last Admin  . acetaminophen (TYLENOL) tablet 650 mg  650 mg Oral Q6H PRN Clarene Essex, MD      . albuterol (PROVENTIL) (2.5 MG/3ML) 0.083% nebulizer solution 2.5 mg  2.5 mg Nebulization Q4H PRN Clarene Essex, MD   2.5 mg at 03/12/21 1350  . albuterol (PROVENTIL) (2.5 MG/3ML) 0.083% nebulizer solution 2.5 mg  2.5 mg Nebulization BID Clarene Essex, MD   2.5 mg at 03/18/21 [id number]  . B-complex with vitamin C tablet 1 tablet  1 tablet Oral Daily Clarene Essex, MD   1 tablet at 03/18/21 [phone]  . budesonide (PULMICORT) nebulizer solution 0.5 mg  0.5 mg Nebulization BID Alfredo Martinez, Brandi L, NP   0.5 mg at 03/18/21 [id number]  . calcium carbonate (OS-CAL - dosed in mg of elemental calcium) tablet 500 mg of elemental calcium  500 mg of elemental calcium Oral Q breakfast Clarene Essex, MD   500 mg of elemental calcium at 03/18/21 0822  . ceFEPIme (MAXIPIME) 2 g in sodium chloride 0.9 % 100 mL IVPB  2 g Intravenous Q24H Merlene Laughter F, NP   Stopped at 03/17/21 1738  . chlorhexidine (PERIDEX) 0.12 % solution 15 mL  15 mL Mouth Rinse BID Little Ishikawa, MD   15 mL at 03/18/21 0957  . Chlorhexidine Gluconate Cloth 2 % PADS 6 each  6 each Topical Daily  Little Ishikawa, MD   6 each at 03/18/21 1045  . diltiazem (CARDIZEM) tablet 90 mg  90 mg Oral Q6H Clarene Essex, MD   90 mg at 03/18/21 0700  . fluticasone (FLONASE) 50 MCG/ACT nasal spray 1 spray  1 spray Each Nare BID Clarene Essex, MD   1 spray at 03/18/21 [phone]  . guaiFENesin (MUCINEX) 12 hr tablet 600 mg  600 mg Oral BID Clarene Essex, MD   600 mg at 03/18/21 0939  . guaiFENesin-dextromethorphan (ROBITUSSIN DM) 100-10 MG/5ML syrup 5 mL  5 mL Oral Q4H PRN Clarene Essex, MD   5 mL at 03/10/21 1815  . insulin aspart (novoLOG) injection 0-15 Units  0-15 Units Subcutaneous TID WC Clarene Essex, MD   3 Units at 03/18/21 [phone]  . insulin aspart (novoLOG) injection 0-5 Units  0-5 Units Subcutaneous QHS Clarene Essex, MD   2 Units at 03/17/21 2224  . insulin glargine (LANTUS) injection 12 Units  12 Units Subcutaneous Daily Clarene Essex, MD   12 Units at 03/18/21 [phone]  . loratadine (CLARITIN) tablet 10 mg  10 mg Oral Daily Clarene Essex, MD   10 mg at 03/18/21 [id number]  . MEDLINE mouth rinse  15 mL Mouth Rinse q12n4p Little Ishikawa, MD   15 mL at 03/16/21 1224  . metroNIDAZOLE (FLAGYL) IVPB 500 mg  500  mg Intravenous Q8H Merlene Laughter F, NP   Stopped at 03/18/21 0957  . midodrine (PROAMATINE) tablet 10 mg  10 mg Oral BID WC Roney Jaffe, MD   10 mg at 03/18/21 5409  . montelukast (SINGULAIR) tablet 10 mg  10 mg Oral Daily Clarene Essex, MD   10 mg at 03/18/21 0940  . multivitamin with minerals tablet 1 tablet  1 tablet Oral Daily Clarene Essex, MD   1 tablet at 03/18/21 [phone]  . norepinephrine (LEVOPHED) 4mg  in 276mL premix infusion  0-40 mcg/min Intravenous Titrated Anders Simmonds, MD   Stopped at 03/17/21 [phone]  . ondansetron (ZOFRAN) tablet 4 mg  4 mg Oral Q6H PRN Clarene Essex, MD       Or  . ondansetron [facility]) injection 4 mg  4 mg Intravenous Q6H PRN Clarene Essex, MD      . pantoprazole (PROTONIX) EC tablet 40 mg  40 mg Oral Daily Clarene Essex, MD   40 mg at 03/18/21 2956  . pseudoephedrine (SUDAFED) 12 hr  tablet 120 mg  120 mg Oral Q12H PRN Clarene Essex, MD      . sodium chloride flush (NS) 0.9 % injection 10-40 mL  10-40 mL Intracatheter Q12H Little Ishikawa, MD   10 mL at 03/17/21 2113  . sodium chloride flush (NS) 0.9 % injection 10-40 mL  10-40 mL Intracatheter PRN Little Ishikawa, MD      . sodium chloride flush (NS) 0.9 % injection 3 mL  3 mL Intravenous Q12H Clarene Essex, MD   3 mL at 03/17/21 2113  . sodium zirconium cyclosilicate (LOKELMA) packet 10 g  10 g Oral BID Roney Jaffe, MD   10 g at 03/18/21 0933  . [START ON 03/26/2021] Vitamin D (Ergocalciferol) (DRISDOL) capsule 50,000 Units  50,000 Units Oral Q14 Days Clarene Essex, MD      . vitamin E capsule 100 Units  100 Units Oral Daily Clarene Essex, MD   100 Units at 03/18/21 2130     Allergies  Allergen Reactions  . Erythromycin Nausea Only    Other Reactions: GI Upset Other Reactions: GI Upset   . Penicillins Rash  . Sulfa Antibiotics Nausea Only    Other Reactions: GI Upset  . Tape Rash    Bandaid  :  Family History  Problem Relation Age of Onset  . COPD Mother   :  Social History   Socioeconomic History  . Marital status: Married    Spouse name: Not on file  . Number of children: Not on file  . Years of education: Not on file  . Highest education level: Not on file  Occupational History  . Not on file  Tobacco Use  . Smoking status: Never Smoker  . Smokeless tobacco: Never Used  Vaping Use  . Vaping Use: Never used  Substance and Sexual Activity  . Alcohol use: No  . Drug use: No  . Sexual activity: Not on file  Other Topics Concern  . Not on file  Social History Narrative  . Not on file   Social Determinants of Health   Financial Resource Strain: Not on file  Food Insecurity: Not on file  Transportation Needs: Not on file  Physical Activity: Not on file  Stress: Not on file  Social Connections: Not on file  Intimate Partner Violence: Not on file  :   Exam: Patient Vitals for the  past 24 hrs:  BP Temp Temp src Pulse Resp SpO2 Weight  03/18/21  0900 118/65 -- -- 79 (!) 22 94 % --  03/18/21 0800 (!) 109/53 -- -- 78 (!) 25 92 % --  03/18/21 0739 -- 97.6 F (36.4 C) Oral -- -- -- --  03/18/21 0400 (!) 107/52 98.3 F (36.8 C) Axillary 77 (!) 24 94 % --  03/18/21 0300 110/61 -- -- 79 (!) 27 96 % --  03/18/21 0200 (!) 112/56 -- -- 82 (!) 26 96 % --  03/18/21 0100 (!) 110/59 -- -- 80 (!) 26 95 % --  03/18/21 0000 (!) 104/52 98.8 F (37.1 C) Axillary 75 (!) 25 96 % --  03/17/21 2300 (!) 100/56 -- -- 80 (!) 22 96 % --  03/17/21 2200 (!) 104/54 -- -- 81 (!) 26 95 % --  03/17/21 2100 (!) 107/58 -- -- 83 (!) 28 94 % 276 lb 7.3 oz (125.4 kg)  03/17/21 2000 (!) 99/53 97.6 F (36.4 C) Oral 79 (!) 28 94 % --  03/17/21 1930 -- -- -- -- -- 97 % --  03/17/21 1925 -- -- -- -- -- 95 % --  03/17/21 1800 (!) 108/57 -- -- 83 (!) 29 93 % --  03/17/21 1746 -- 98.4 F (36.9 C) Oral -- -- -- --  03/17/21 1704 (!) 121/56 -- -- -- -- -- --  03/17/21 1600 (!) 112/52 -- -- (!) 115 20 97 % --  03/17/21 1405 118/74 -- -- -- -- -- --  03/17/21 1400 -- -- -- 99 (!) 24 97 % --  03/17/21 1213 -- 98.1 F (36.7 C) Oral -- -- -- --  03/17/21 1200 (!) 118/59 -- -- 98 (!) 24 93 % --    Patient is alert, keeps her eyes closed, answers questions intermittently. She is very jaundiced to appearance, anasarca noted. No respiratory distress at the time of my visit.  Lab Results  Component Value Date   WBC 15.1 (H) 03/18/2021   HGB 7.3 (L) 03/18/2021   HCT 23.6 (L) 03/18/2021   PLT 230 03/18/2021   GLUCOSE 190 (H) 03/18/2021   ALT 108 (H) 03/18/2021   AST 54 (H) 03/18/2021   NA 131 (L) 03/18/2021   K 5.6 (H) 03/18/2021   CL 102 03/18/2021   CREATININE 4.72 (H) 03/18/2021   BUN 92 (H) 03/18/2021   CO2 18 (L) 03/18/2021    US RENAL  Result Date: 03/18/2021 CLINICAL DATA:  Acute kidney injury EXAM: RENAL / URINARY TRACT ULTRASOUND COMPLETE COMPARISON:  MRI abdomen 03/09/2021. FINDINGS: Right  Kidney: Renal measurements: 11.3 x 5.1 x 4.9 cm = volume: 149 mL. Echogenicity within normal limits. No mass or hydronephrosis visualized. Left Kidney: Renal measurements: 12.1 x 5.0 x 4.7 cm = volume: 147 mL. Echogenicity within normal limits. No mass or hydronephrosis visualized. Bladder: Foley catheter present within the decompressed bladder. Other: Partial hypoechoic solid imaging of the liver demonstrates multiple lesions and biliary ductal dilatation. Correlation with prior MR imaging confirms known metastatic disease. IMPRESSION: 1. Unremarkable sonographic appearance of the kidneys. Specifically, no evidence of hydronephrosis. 2. Incompletely imaged hepatic metastatic disease in biliary ductal dilatation. Findings are known from prior MRI imaging dated 03/09/2021. 3. Moderate perihepatic ascites. Electronically Signed   By: Jacqulynn Cadet M.D.   On: 03/18/2021 09:50   MR 3D Recon At Scanner  Result Date: 03/12/2021 CLINICAL DATA:  High of is 4% a correspond down there insert no Radian that and rat I was still at so ileal little blue Christmas tree with a syringe the all a  EM least Conray at yes not use deformity in 90s knee cell identity is a few years ago and asked the technologist to upstream catheters of attic at both hands tied right and I can be of the paddle with initial exam she had say none of the images medially in the that guys like stones image from the ulnar FX. The pancreatic head lesion on CT scan. EXAM: MRI ABDOMEN WITHOUT AND WITH CONTRAST (INCLUDING MRCP) TECHNIQUE: Multiplanar multisequence MR imaging of the abdomen was performed both before and after the administration of intravenous contrast. Heavily T2-weighted images of the biliary and pancreatic ducts were obtained, and three-dimensional MRCP images were rendered by post processing. CONTRAST:  48mL GADAVIST GADOBUTROL 1 MMOL/ML IV SOLN COMPARISON:  CT scan 03/06/2021 FINDINGS: Lower chest: As seen on recent CT scan, pulmonary  nodules are noted in the lung bases bilaterally. Hepatobiliary: Multiple rim enhancing liver lesions are evident, including a dominant irregular segment 4 lesion measuring 3.7 x 5.1 cm. These hepatic lesions demonstrate peripheral rim enhancement and restricted diffusion, consistent with metastatic disease. Gallbladder is contracted with numerous intraluminal stones. Diffuse intrahepatic biliary duct dilatation is noted in the right and left hepatic lobes. MRCP imaging is markedly motion degraded but the level of biliary obstruction appears to be in the hepatoduodenal ligament. Pancreas: Diffuse dilatation of the main pancreatic duct noted with abrupt cut off in the head of pancreas. There is abrupt cut off of the common bile duct at the level of the pancreatic head as well.16 mm rim enhancing lesion is identified in the head of the pancreas, corresponding to the site of pancreatic duct and biliary obstruction. Abnormal soft tissue showing some rim enhancement is identified in the hepatoduodenal ligament, apparently encasing the common hepatic artery and involving the proximal splenic artery although both vessels remain patent. There is marked attenuation of the portal vein although this, too, remains patent. SMV and splenic vein are patent. Celiac axis and SMA may share in origin (no sagittal imaging as part of this study to further evaluate) with preserved fat plane around the proximal SMA. There is abnormal soft tissue in the celiac trifurcation. Spleen:  No splenomegaly. No focal mass lesion. Adrenals/Urinary Tract: No adrenal nodule or mass. Kidneys unremarkable. Stomach/Bowel: Stomach is nondistended Duodenum is normally positioned as is the ligament of Treitz. No small bowel or colonic dilatation within the visualized abdomen. Vascular/Lymphatic: No abdominal aortic aneurysm. See pancreas section above. Mild lymphadenopathy noted in the porta hepatis. Other:  No substantial intraperitoneal free fluid.  Musculoskeletal: No focal suspicious marrow enhancement within the visualized bony anatomy. IMPRESSION: 1. Multiple rim enhancing liver lesions consistent with metastatic disease. This finding is associated with biliary and pancreatic duct obstruction in the pancreatic head region. There is a small rim enhancing lesion in the head of pancreas. Imaging features likely reflect pancreatic adenocarcinoma with metastatic disease. Central cholangiocarcinoma with metastatic spread into the porta hepatis is considered less likely but not excluded. EUS/ERCP would likely prove helpful to further evaluate. 2. Disease in the hepatoduodenal ligament generates substantial mass-effect on the portal vein and appears to encase the common hepatic artery extending into the region of the celiac trifurcation. Major arterial and venous anatomy of the central abdomen is patent at this time. 3. Multiple pulmonary nodules consistent with metastatic disease. 4. Cholelithiasis. Electronically Signed   By: Misty Stanley M.D.   On: 03/10/2021 09:14   DG Chest Port 1 View  Result Date: 04/09/2021 CLINICAL DATA:  CVL placement ERCP EXAM: PORTABLE  CHEST 1 VIEW COMPARISON:  03/31/2021, CT 02/14/2021, chest x-ray 03/13/2021 FINDINGS: Right-sided central venous catheter tip over the SVC. No pneumothorax. Multiple bilateral pulmonary nodules consistent with metastatic disease. Stable cardiomediastinal silhouette. Probable vascular congestion. Small left effusion with airspace disease at left lung base. No pneumothorax. IMPRESSION: 1. Right IJ central venous catheter tip over the SVC. No pneumothorax. 2. Pulmonary metastatic disease. Similar small left effusion and basilar airspace disease. Electronically Signed   By: Donavan Foil M.D.   On: 2021/04/08 17:59   DG CHEST PORT 1 VIEW  Result Date: 04/03/2021 CLINICAL DATA:  70 year old female with history of wheezing. EXAM: PORTABLE CHEST 1 VIEW COMPARISON:  Chest x-ray 03/13/2021. FINDINGS: Lung  volumes are low. Numerous pulmonary nodules are again noted scattered throughout the lungs bilaterally. However, today's study demonstrates more ill-defined opacities and areas of interstitial prominence. New blunting of the left costophrenic sulcus indicative of a new small left pleural effusion. No definite right pleural effusion. Heart size appears normal. The patient is rotated to the right on today's exam, resulting in distortion of the mediastinal contours and reduced diagnostic sensitivity and specificity for mediastinal pathology. Aortic atherosclerosis. IMPRESSION: 1. Widespread metastatic disease to the lungs redemonstrated with new small left pleural effusion. 2. Interval development of ill-defined opacities in the lungs and areas of interstitial prominence. Given the widespread metastatic disease, this may simply reflect developing lymphangitic spread. Alternatively, noncardiogenic pulmonary edema or multifocal atypical infection could be considered. Electronically Signed   By: Vinnie Langton M.D.   On: 04/01/2021 21:07   DG CHEST PORT 1 VIEW  Result Date: 03/13/2021 CLINICAL DATA:  Pancreatic carcinoma EXAM: PORTABLE CHEST 1 VIEW COMPARISON:  Chest CT March 08, 2021 FINDINGS: Multiple nodular opacities throughout the lungs consistent with metastatic disease, similar to recent CT examination. No edema or airspace opacity. Heart is upper normal in size with pulmonary vascularity normal. Adenopathy in the mediastinum better appreciated by CT but suggested by radiography. No bone lesions. IMPRESSION: Findings indicative of metastatic disease, similar to recent CT. No edema or airspace opacity. Heart upper normal in size. Electronically Signed   By: Lowella Grip III M.D.   On: 03/13/2021 11:41   DG Chest Port 1 View  Result Date: 02/20/2021 CLINICAL DATA:  Cough.  Fatigue. EXAM: PORTABLE CHEST 1 VIEW COMPARISON:  Chest x-ray 12/24/2015. FINDINGS: Mediastinum and hilar structures normal.  Cardiomegaly. No pulmonary venous congestion. Pulmonary nodular opacities are noted bilaterally. An infectious process or metastatic disease cannot be excluded. Contrast-enhanced CT of the chest suggested for further evaluation. Mild bibasilar atelectasis. No pleural effusion or pneumothorax. Degenerative change thoracic spine. Surgical clips right chest. IMPRESSION: Nodular opacities are noted over both lungs. An infectious process or metastatic disease cannot be excluded. Contrast-enhanced CT of the chest suggested for further evaluation. Electronically Signed   By: Marcello Moores  Register   On: 03/10/2021 11:50   DG Knee Right Port  Result Date: 03/04/2021 CLINICAL DATA:  Fall with knee pain EXAM: PORTABLE RIGHT KNEE - 1-2 VIEW COMPARISON:  12/24/2015 FINDINGS: Small knee joint effusion. Advanced degenerative change of the patellofemoral joint. Degenerative change also of the weight-bearing compartments with marginal osteophytes, worse lateral than medial. Multiple intra-articular loose bodies. No acute traumatic finding. IMPRESSION: No acute traumatic finding. 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 Small joint effusion. Electronically Signed   By: Nelson Chimes M.D.   On: 02/12/2021 11:58   DG ERCP BILIARY & PANCREATIC DUCTS  Result Date: Apr 08, 2021 CLINICAL DATA:  Pancreatic cancer. EXAM: ERCP  TECHNIQUE: Multiple spot images obtained with the fluoroscopic device and submitted for interpretation post-procedure. COMPARISON:  ERCP-03/15/2019; MRCP-03/09/2021 FLUOROSCOPY TIME:  14 minutes, 33 seconds FINDINGS: Multiple spot fluoroscopic images the right upper abdominal quadrant are provided for review. Initial image demonstrates an ERCP probe overlying the right upper abdominal quadrant. Subsequent images demonstrate opacification of the descending duodenum, ultimately with successful cannulation and opacification of the CBD which appears markedly abnormal with beaded  irregularity. There is minimal opacification of the intrahepatic biliary tree which appears at least moderately dilated centrally. Completion image demonstrates placement of an internal biliary stent overlying the expected location of the CBD. IMPRESSION: ERCP with internal biliary stent placement as above. These images were submitted for radiologic interpretation only. Please see the procedural report for the amount of contrast and the fluoroscopy time utilized. Electronically Signed   By: Sandi Mariscal M.D.   On: 03/27/2021 15:05   DG ERCP BILIARY & PANCREATIC DUCTS  Result Date: 03/11/2021 CLINICAL DATA:  Pancreatic mass with liver lesions on MRI. EXAM: ERCP TECHNIQUE: Multiple spot images obtained with the fluoroscopic device and submitted for interpretation post-procedure. COMPARISON:  MRCP 03/09/2021 FINDINGS: A series of fluoroscopic spot images are submitted documenting endoscopic cannulation with only small volume contrast administration limiting ductal evaluation. IMPRESSION: Limited study with scant contrast administration. These images were submitted for radiologic interpretation only. Please see the procedural report for the amount of contrast and the fluoroscopy time utilized. Electronically Signed   By: Lucrezia Europe M.D.   On: 03/29/2021 16:58   MR ABDOMEN MRCP W WO CONTAST  Result Date: 03/10/2021 CLINICAL DATA:  High of is 4% a correspond down there insert no Radian that and rat I was still at so ileal little blue Christmas tree with a syringe the all a EM least Conray at yes not use deformity in 90s knee cell identity is a few years ago and asked the technologist to upstream catheters of attic at both hands tied right and I can be of the paddle with initial exam she had say none of the images medially in the that guys like stones image from the ulnar FX. The pancreatic head lesion on CT scan. EXAM: MRI ABDOMEN WITHOUT AND WITH CONTRAST (INCLUDING MRCP) TECHNIQUE: Multiplanar multisequence MR  imaging of the abdomen was performed both before and after the administration of intravenous contrast. Heavily T2-weighted images of the biliary and pancreatic ducts were obtained, and three-dimensional MRCP images were rendered by post processing. CONTRAST:  24mL GADAVIST GADOBUTROL 1 MMOL/ML IV SOLN COMPARISON:  CT scan 03/10/2021 FINDINGS: Lower chest: As seen on recent CT scan, pulmonary nodules are noted in the lung bases bilaterally. Hepatobiliary: Multiple rim enhancing liver lesions are evident, including a dominant irregular segment 4 lesion measuring 3.7 x 5.1 cm. These hepatic lesions demonstrate peripheral rim enhancement and restricted diffusion, consistent with metastatic disease. Gallbladder is contracted with numerous intraluminal stones. Diffuse intrahepatic biliary duct dilatation is noted in the right and left hepatic lobes. MRCP imaging is markedly motion degraded but the level of biliary obstruction appears to be in the hepatoduodenal ligament. Pancreas: Diffuse dilatation of the main pancreatic duct noted with abrupt cut off in the head of pancreas. There is abrupt cut off of the common bile duct at the level of the pancreatic head as well.16 mm rim enhancing lesion is identified in the head of the pancreas, corresponding to the site of pancreatic duct and biliary obstruction. Abnormal soft tissue showing some rim enhancement is identified in the hepatoduodenal  ligament, apparently encasing the common hepatic artery and involving the proximal splenic artery although both vessels remain patent. There is marked attenuation of the portal vein although this, too, remains patent. SMV and splenic vein are patent. Celiac axis and SMA may share in origin (no sagittal imaging as part of this study to further evaluate) with preserved fat plane around the proximal SMA. There is abnormal soft tissue in the celiac trifurcation. Spleen:  No splenomegaly. No focal mass lesion. Adrenals/Urinary Tract: No  adrenal nodule or mass. Kidneys unremarkable. Stomach/Bowel: Stomach is nondistended Duodenum is normally positioned as is the ligament of Treitz. No small bowel or colonic dilatation within the visualized abdomen. Vascular/Lymphatic: No abdominal aortic aneurysm. See pancreas section above. Mild lymphadenopathy noted in the porta hepatis. Other:  No substantial intraperitoneal free fluid. Musculoskeletal: No focal suspicious marrow enhancement within the visualized bony anatomy. IMPRESSION: 1. Multiple rim enhancing liver lesions consistent with metastatic disease. This finding is associated with biliary and pancreatic duct obstruction in the pancreatic head region. There is a small rim enhancing lesion in the head of pancreas. Imaging features likely reflect pancreatic adenocarcinoma with metastatic disease. Central cholangiocarcinoma with metastatic spread into the porta hepatis is considered less likely but not excluded. EUS/ERCP would likely prove helpful to further evaluate. 2. Disease in the hepatoduodenal ligament generates substantial mass-effect on the portal vein and appears to encase the common hepatic artery extending into the region of the celiac trifurcation. Major arterial and venous anatomy of the central abdomen is patent at this time. 3. Multiple pulmonary nodules consistent with metastatic disease. 4. Cholelithiasis. Electronically Signed   By: Misty Stanley M.D.   On: 03/10/2021 09:14   ECHOCARDIOGRAM COMPLETE  Result Date: 03/11/2021    ECHOCARDIOGRAM REPORT   Patient Name:   Durel Salts Date of Exam: 03/11/2021 Medical Rec #:  [id number]        Height:       63.0 in Accession #:    [id number]       Weight:       216.0 lb Date of Birth:  06/17/1951        BSA:          1.998 m Patient Age:    64 years         BP:           134/83 mmHg Patient Gender: F                HR:           92 bpm. Exam Location:  Inpatient Procedure: 2D Echo, Cardiac Doppler, Color Doppler and Intracardiac             Opacification Agent Indications:    I47.1 SVT  History:        Patient has no prior history of Echocardiogram examinations.                 Risk Factors:Hypertension and Diabetes. GERD. Cancer.  Sonographer:    Jonelle Sidle Dance Referring Phys: Rockwood Derwood  1. Left ventricular ejection fraction, by estimation, is 70 to 75%. The left ventricle has hyperdynamic function. The left ventricle has no regional wall motion abnormalities. There is mild left ventricular hypertrophy. Left ventricular diastolic parameters are consistent with Grade I diastolic dysfunction (impaired relaxation).  2. Right ventricular systolic function is normal. The right ventricular size is normal.  3. Left atrial size was mildly dilated.  4. The mitral valve is normal in  structure. No evidence of mitral valve regurgitation. No evidence of mitral stenosis. Moderate mitral annular calcification.  5. The aortic valve is normal in structure. Aortic valve regurgitation is not visualized. No aortic stenosis is present.  6. The inferior vena cava is normal in size with greater than 50% respiratory variability, suggesting right atrial pressure of 3 mmHg. FINDINGS  Left Ventricle: Left ventricular ejection fraction, by estimation, is 70 to 75%. The left ventricle has hyperdynamic function. The left ventricle has no regional wall motion abnormalities. Definity contrast agent was given IV to delineate the left ventricular endocardial borders. The left ventricular internal cavity size was normal in size. There is mild left ventricular hypertrophy. Left ventricular diastolic parameters are consistent with Grade I diastolic dysfunction (impaired relaxation). Right Ventricle: The right ventricular size is normal. No increase in right ventricular wall thickness. Right ventricular systolic function is normal. Left Atrium: Left atrial size was mildly dilated. Right Atrium: Right atrial size was normal in size. Pericardium: There is no  evidence of pericardial effusion. Mitral Valve: The mitral valve is normal in structure. Moderate mitral annular calcification. No evidence of mitral valve regurgitation. No evidence of mitral valve stenosis. Tricuspid Valve: The tricuspid valve is normal in structure. Tricuspid valve regurgitation is not demonstrated. No evidence of tricuspid stenosis. Aortic Valve: The aortic valve is normal in structure. Aortic valve regurgitation is not visualized. No aortic stenosis is present. Pulmonic Valve: The pulmonic valve was normal in structure. Pulmonic valve regurgitation is not visualized. No evidence of pulmonic stenosis. Aorta: The aortic root is normal in size and structure. Venous: The inferior vena cava is normal in size with greater than 50% respiratory variability, suggesting right atrial pressure of 3 mmHg. IAS/Shunts: No atrial level shunt detected by color flow Doppler.  LEFT VENTRICLE PLAX 2D LVIDd:         4.60 cm  Diastology LVIDs:         2.60 cm  LV e' medial:    7.62 cm/s LV PW:         1.20 cm  LV E/e' medial:  11.1 LV IVS:        1.10 cm  LV e' lateral:   8.05 cm/s LVOT diam:     2.00 cm  LV E/e' lateral: 10.5 LV SV:         61 LV SV Index:   31 LVOT Area:     3.14 cm  RIGHT VENTRICLE             IVC RV Basal diam:  3.50 cm     IVC diam: 1.90 cm RV Mid diam:    2.80 cm RV S prime:     20.10 cm/s TAPSE (M-mode): 2.0 cm LEFT ATRIUM             Index       RIGHT ATRIUM           Index LA diam:        3.60 cm 1.80 cm/m  RA Area:     11.70 cm LA Vol (A2C):   82.2 ml 41.14 ml/m RA Volume:   24.70 ml  12.36 ml/m LA Vol (A4C):   49.1 ml 24.58 ml/m LA Biplane Vol: 69.0 ml 34.54 ml/m  AORTIC VALVE LVOT Vmax:   103.00 cm/s LVOT Vmean:  77.000 cm/s LVOT VTI:    0.194 m  AORTA Ao Root diam: 3.00 cm Ao Asc diam:  3.80 cm MITRAL VALVE MV Area (PHT): 2.87 cm  SHUNTS MV Decel Time: 264 msec     Systemic VTI:  0.19 m MV E velocity: 84.60 cm/s   Systemic Diam: 2.00 cm MV A velocity: 117.00 cm/s MV E/A  ratio:  0.72 Donato Schultz MD Electronically signed by Donato Schultz MD Signature Date/Time: 03/11/2021/12:06:10 PM    Final    CT CHEST ABDOMEN PELVIS WO CONTRAST  Result Date: 02/20/2021 CLINICAL DATA:  Weakness.  Recent fall. EXAM: CT CHEST, ABDOMEN AND PELVIS WITHOUT CONTRAST TECHNIQUE: Multidetector CT imaging of the chest, abdomen and pelvis was performed following the standard protocol without IV contrast. COMPARISON:  None. FINDINGS: CT CHEST FINDINGS Cardiovascular: Atherosclerosis of thoracic aorta is noted without aneurysm formation. Normal cardiac size. No pericardial effusion. Coronary artery calcifications are noted. Mediastinum/Nodes: Thyroid gland is unremarkable. Esophagus is unremarkable. 2 cm subcarinal lymph node is noted. 1.5 cm right paratracheal lymph node is noted. Lungs/Pleura: No pneumothorax or pleural effusion is noted. Multiple nodules are noted throughout both lungs concerning for metastatic disease. The largest measures 1.9 cm in left lower lobe. Musculoskeletal: No chest wall mass or suspicious bone lesions identified. CT ABDOMEN PELVIS FINDINGS Hepatobiliary: Cholelithiasis is noted. Multiple ill-defined hypoechoic areas are noted throughout the liver concerning for metastatic disease. The largest measures 5.2 x 4.3 cm in the anterior portion of the right hepatic lobe. Mild intrahepatic biliary dilatation is noted concerning for obstruction of the distal common bile duct which may be due to possible pancreatic head mass measuring 2.7 cm. Pancreas: As noted above, possible low density is noted in the pancreatic head which is ill-defined and measures approximately 2.7 cm, concerning for possible pancreatic malignancy. Spleen: Normal in size without focal abnormality. Adrenals/Urinary Tract: Adrenal glands are unremarkable. Kidneys are normal, without renal calculi, focal lesion, or hydronephrosis. Bladder is unremarkable. Stomach/Bowel: Stomach is within normal limits. Appendix appears  normal. No evidence of bowel wall thickening, distention, or inflammatory changes. Vascular/Lymphatic: Atherosclerosis of abdominal aorta is noted without aneurysm formation. Periaortic adenopathy is noted concerning for metastatic disease. The largest lymph node measures 9 mm in minor axis. Reproductive: Status post hysterectomy. No adnexal masses. Other: Minimal free fluid is noted in the pelvis. No definite hernia is noted. Musculoskeletal: No acute or significant osseous findings. IMPRESSION: Multiple pulmonary nodules are noted consistent with metastatic disease. Multiple ill-defined hypoechoic areas are noted in the hepatic parenchyma concerning for metastatic disease. The largest such lesion measures 5.2 cm in the right hepatic lobe. Possible well-defined low density seen in pancreatic head which measures 2.7 cm and is concerning for possible pancreatic malignancy. Further evaluation with MRI or CT scan with intravenous contrast is recommended. Mild intrahepatic biliary dilatation is noted which may be due to obstruction secondary to this mass. Periaortic adenopathy is noted concerning for metastatic disease. Coronary artery calcifications are noted suggesting coronary artery disease. Cholelithiasis. Aortic Atherosclerosis (ICD10-I70.0). Electronically Signed   By: Lupita Raider M.D.   On: 02/22/2021 12:55    Pathology: Reviewed.  US RENAL  Result Date: 03/18/2021 CLINICAL DATA:  Acute kidney injury EXAM: RENAL / URINARY TRACT ULTRASOUND COMPLETE COMPARISON:  MRI abdomen 03/09/2021. FINDINGS: Right Kidney: Renal measurements: 11.3 x 5.1 x 4.9 cm = volume: 149 mL. Echogenicity within normal limits. No mass or hydronephrosis visualized. Left Kidney: Renal measurements: 12.1 x 5.0 x 4.7 cm = volume: 147 mL. Echogenicity within normal limits. No mass or hydronephrosis visualized. Bladder: Foley catheter present within the decompressed bladder. Other: Partial hypoechoic solid imaging of the liver  demonstrates multiple lesions and biliary  ductal dilatation. Correlation with prior MR imaging confirms known metastatic disease. IMPRESSION: 1. Unremarkable sonographic appearance of the kidneys. Specifically, no evidence of hydronephrosis. 2. Incompletely imaged hepatic metastatic disease in biliary ductal dilatation. Findings are known from prior MRI imaging dated 03/09/2021. 3. Moderate perihepatic ascites. Electronically Signed   By: Malachy MoanHeath  McCullough M.D.   On: 03/18/2021 09:50   MR 3D Recon At Scanner  Result Date: 03/12/2021 CLINICAL DATA:  High of is 4% a correspond down there insert no Radian that and rat I was still at so ileal little blue Christmas tree with a syringe the all a EM least Conray at yes not use deformity in 90s knee cell identity is a few years ago and asked the technologist to upstream catheters of attic at both hands tied right and I can be of the paddle with initial exam she had say none of the images medially in the that guys like stones image from the ulnar FX. The pancreatic head lesion on CT scan. EXAM: MRI ABDOMEN WITHOUT AND WITH CONTRAST (INCLUDING MRCP) TECHNIQUE: Multiplanar multisequence MR imaging of the abdomen was performed both before and after the administration of intravenous contrast. Heavily T2-weighted images of the biliary and pancreatic ducts were obtained, and three-dimensional MRCP images were rendered by post processing. CONTRAST:  10mL GADAVIST GADOBUTROL 1 MMOL/ML IV SOLN COMPARISON:  CT scan 02/28/2021 FINDINGS: Lower chest: As seen on recent CT scan, pulmonary nodules are noted in the lung bases bilaterally. Hepatobiliary: Multiple rim enhancing liver lesions are evident, including a dominant irregular segment 4 lesion measuring 3.7 x 5.1 cm. These hepatic lesions demonstrate peripheral rim enhancement and restricted diffusion, consistent with metastatic disease. Gallbladder is contracted with numerous intraluminal stones. Diffuse intrahepatic biliary duct  dilatation is noted in the right and left hepatic lobes. MRCP imaging is markedly motion degraded but the level of biliary obstruction appears to be in the hepatoduodenal ligament. Pancreas: Diffuse dilatation of the main pancreatic duct noted with abrupt cut off in the head of pancreas. There is abrupt cut off of the common bile duct at the level of the pancreatic head as well.16 mm rim enhancing lesion is identified in the head of the pancreas, corresponding to the site of pancreatic duct and biliary obstruction. Abnormal soft tissue showing some rim enhancement is identified in the hepatoduodenal ligament, apparently encasing the common hepatic artery and involving the proximal splenic artery although both vessels remain patent. There is marked attenuation of the portal vein although this, too, remains patent. SMV and splenic vein are patent. Celiac axis and SMA may share in origin (no sagittal imaging as part of this study to further evaluate) with preserved fat plane around the proximal SMA. There is abnormal soft tissue in the celiac trifurcation. Spleen:  No splenomegaly. No focal mass lesion. Adrenals/Urinary Tract: No adrenal nodule or mass. Kidneys unremarkable. Stomach/Bowel: Stomach is nondistended Duodenum is normally positioned as is the ligament of Treitz. No small bowel or colonic dilatation within the visualized abdomen. Vascular/Lymphatic: No abdominal aortic aneurysm. See pancreas section above. Mild lymphadenopathy noted in the porta hepatis. Other:  No substantial intraperitoneal free fluid. Musculoskeletal: No focal suspicious marrow enhancement within the visualized bony anatomy. IMPRESSION: 1. Multiple rim enhancing liver lesions consistent with metastatic disease. This finding is associated with biliary and pancreatic duct obstruction in the pancreatic head region. There is a small rim enhancing lesion in the head of pancreas. Imaging features likely reflect pancreatic adenocarcinoma with  metastatic disease. Central cholangiocarcinoma with metastatic spread  into the porta hepatis is considered less likely but not excluded. EUS/ERCP would likely prove helpful to further evaluate. 2. Disease in the hepatoduodenal ligament generates substantial mass-effect on the portal vein and appears to encase the common hepatic artery extending into the region of the celiac trifurcation. Major arterial and venous anatomy of the central abdomen is patent at this time. 3. Multiple pulmonary nodules consistent with metastatic disease. 4. Cholelithiasis. Electronically Signed   By: Misty Stanley M.D.   On: 03/10/2021 09:14   DG Chest Port 1 View  Result Date: 03/27/2021 CLINICAL DATA:  CVL placement ERCP EXAM: PORTABLE CHEST 1 VIEW COMPARISON:  04/01/2021, CT 02/17/2021, chest x-ray 03/13/2021 FINDINGS: Right-sided central venous catheter tip over the SVC. No pneumothorax. Multiple bilateral pulmonary nodules consistent with metastatic disease. Stable cardiomediastinal silhouette. Probable vascular congestion. Small left effusion with airspace disease at left lung base. No pneumothorax. IMPRESSION: 1. Right IJ central venous catheter tip over the SVC. No pneumothorax. 2. Pulmonary metastatic disease. Similar small left effusion and basilar airspace disease. Electronically Signed   By: Donavan Foil M.D.   On: 03/19/2021 17:59   DG CHEST PORT 1 VIEW  Result Date: 04/04/2021 CLINICAL DATA:  70 year old female with history of wheezing. EXAM: PORTABLE CHEST 1 VIEW COMPARISON:  Chest x-ray 03/13/2021. FINDINGS: Lung volumes are low. Numerous pulmonary nodules are again noted scattered throughout the lungs bilaterally. However, today's study demonstrates more ill-defined opacities and areas of interstitial prominence. New blunting of the left costophrenic sulcus indicative of a new small left pleural effusion. No definite right pleural effusion. Heart size appears normal. The patient is rotated to the right on today's  exam, resulting in distortion of the mediastinal contours and reduced diagnostic sensitivity and specificity for mediastinal pathology. Aortic atherosclerosis. IMPRESSION: 1. Widespread metastatic disease to the lungs redemonstrated with new small left pleural effusion. 2. Interval development of ill-defined opacities in the lungs and areas of interstitial prominence. Given the widespread metastatic disease, this may simply reflect developing lymphangitic spread. Alternatively, noncardiogenic pulmonary edema or multifocal atypical infection could be considered. Electronically Signed   By: Vinnie Langton M.D.   On: 03/20/2021 21:07   DG CHEST PORT 1 VIEW  Result Date: 03/13/2021 CLINICAL DATA:  Pancreatic carcinoma EXAM: PORTABLE CHEST 1 VIEW COMPARISON:  Chest CT March 08, 2021 FINDINGS: Multiple nodular opacities throughout the lungs consistent with metastatic disease, similar to recent CT examination. No edema or airspace opacity. Heart is upper normal in size with pulmonary vascularity normal. Adenopathy in the mediastinum better appreciated by CT but suggested by radiography. No bone lesions. IMPRESSION: Findings indicative of metastatic disease, similar to recent CT. No edema or airspace opacity. Heart upper normal in size. Electronically Signed   By: Lowella Grip III M.D.   On: 03/13/2021 11:41   DG Chest Port 1 View  Result Date: 03/09/2021 CLINICAL DATA:  Cough.  Fatigue. EXAM: PORTABLE CHEST 1 VIEW COMPARISON:  Chest x-ray 12/24/2015. FINDINGS: Mediastinum and hilar structures normal. Cardiomegaly. No pulmonary venous congestion. Pulmonary nodular opacities are noted bilaterally. An infectious process or metastatic disease cannot be excluded. Contrast-enhanced CT of the chest suggested for further evaluation. Mild bibasilar atelectasis. No pleural effusion or pneumothorax. Degenerative change thoracic spine. Surgical clips right chest. IMPRESSION: Nodular opacities are noted over both lungs.  An infectious process or metastatic disease cannot be excluded. Contrast-enhanced CT of the chest suggested for further evaluation. Electronically Signed   By: Marcello Moores  Register   On: 03/09/2021 11:50   DG Knee  Right Port  Result Date: 02/16/2021 CLINICAL DATA:  Fall with knee pain EXAM: PORTABLE RIGHT KNEE - 1-2 VIEW COMPARISON:  12/24/2015 FINDINGS: Small knee joint effusion. Advanced degenerative change of the patellofemoral joint. Degenerative change also of the weight-bearing compartments with marginal osteophytes, worse lateral than medial. Multiple intra-articular loose bodies. No acute traumatic finding. IMPRESSION: No acute traumatic finding. 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 Small joint effusion. Electronically Signed   By: Nelson Chimes M.D.   On: 02/17/2021 11:58   DG ERCP BILIARY & PANCREATIC DUCTS  Result Date: 04/10/2021 CLINICAL DATA:  Pancreatic cancer. EXAM: ERCP TECHNIQUE: Multiple spot images obtained with the fluoroscopic device and submitted for interpretation post-procedure. COMPARISON:  ERCP-03/15/2019; MRCP-03/09/2021 FLUOROSCOPY TIME:  14 minutes, 33 seconds FINDINGS: Multiple spot fluoroscopic images the right upper abdominal quadrant are provided for review. Initial image demonstrates an ERCP probe overlying the right upper abdominal quadrant. Subsequent images demonstrate opacification of the descending duodenum, ultimately with successful cannulation and opacification of the CBD which appears markedly abnormal with beaded irregularity. There is minimal opacification of the intrahepatic biliary tree which appears at least moderately dilated centrally. Completion image demonstrates placement of an internal biliary stent overlying the expected location of the CBD. IMPRESSION: ERCP with internal biliary stent placement as above. These images were submitted for radiologic interpretation only. Please see the procedural report for the amount of  contrast and the fluoroscopy time utilized. Electronically Signed   By: Sandi Mariscal M.D.   On: 04/02/2021 15:05   DG ERCP BILIARY & PANCREATIC DUCTS  Result Date: 04/06/2021 CLINICAL DATA:  Pancreatic mass with liver lesions on MRI. EXAM: ERCP TECHNIQUE: Multiple spot images obtained with the fluoroscopic device and submitted for interpretation post-procedure. COMPARISON:  MRCP 03/09/2021 FINDINGS: A series of fluoroscopic spot images are submitted documenting endoscopic cannulation with only small volume contrast administration limiting ductal evaluation. IMPRESSION: Limited study with scant contrast administration. These images were submitted for radiologic interpretation only. Please see the procedural report for the amount of contrast and the fluoroscopy time utilized. Electronically Signed   By: Lucrezia Europe M.D.   On: 03/24/2021 16:58   MR ABDOMEN MRCP W WO CONTAST  Result Date: 03/10/2021 CLINICAL DATA:  High of is 4% a correspond down there insert no Radian that and rat I was still at so ileal little blue Christmas tree with a syringe the all a EM least Conray at yes not use deformity in 90s knee cell identity is a few years ago and asked the technologist to upstream catheters of attic at both hands tied right and I can be of the paddle with initial exam she had say none of the images medially in the that guys like stones image from the ulnar FX. The pancreatic head lesion on CT scan. EXAM: MRI ABDOMEN WITHOUT AND WITH CONTRAST (INCLUDING MRCP) TECHNIQUE: Multiplanar multisequence MR imaging of the abdomen was performed both before and after the administration of intravenous contrast. Heavily T2-weighted images of the biliary and pancreatic ducts were obtained, and three-dimensional MRCP images were rendered by post processing. CONTRAST:  54mL GADAVIST GADOBUTROL 1 MMOL/ML IV SOLN COMPARISON:  CT scan 02/16/2021 FINDINGS: Lower chest: As seen on recent CT scan, pulmonary nodules are noted in the lung  bases bilaterally. Hepatobiliary: Multiple rim enhancing liver lesions are evident, including a dominant irregular segment 4 lesion measuring 3.7 x 5.1 cm. These hepatic lesions demonstrate peripheral rim enhancement and restricted diffusion, consistent with metastatic disease. Gallbladder is contracted with numerous intraluminal  stones. Diffuse intrahepatic biliary duct dilatation is noted in the right and left hepatic lobes. MRCP imaging is markedly motion degraded but the level of biliary obstruction appears to be in the hepatoduodenal ligament. Pancreas: Diffuse dilatation of the main pancreatic duct noted with abrupt cut off in the head of pancreas. There is abrupt cut off of the common bile duct at the level of the pancreatic head as well.16 mm rim enhancing lesion is identified in the head of the pancreas, corresponding to the site of pancreatic duct and biliary obstruction. Abnormal soft tissue showing some rim enhancement is identified in the hepatoduodenal ligament, apparently encasing the common hepatic artery and involving the proximal splenic artery although both vessels remain patent. There is marked attenuation of the portal vein although this, too, remains patent. SMV and splenic vein are patent. Celiac axis and SMA may share in origin (no sagittal imaging as part of this study to further evaluate) with preserved fat plane around the proximal SMA. There is abnormal soft tissue in the celiac trifurcation. Spleen:  No splenomegaly. No focal mass lesion. Adrenals/Urinary Tract: No adrenal nodule or mass. Kidneys unremarkable. Stomach/Bowel: Stomach is nondistended Duodenum is normally positioned as is the ligament of Treitz. No small bowel or colonic dilatation within the visualized abdomen. Vascular/Lymphatic: No abdominal aortic aneurysm. See pancreas section above. Mild lymphadenopathy noted in the porta hepatis. Other:  No substantial intraperitoneal free fluid. Musculoskeletal: No focal suspicious  marrow enhancement within the visualized bony anatomy. IMPRESSION: 1. Multiple rim enhancing liver lesions consistent with metastatic disease. This finding is associated with biliary and pancreatic duct obstruction in the pancreatic head region. There is a small rim enhancing lesion in the head of pancreas. Imaging features likely reflect pancreatic adenocarcinoma with metastatic disease. Central cholangiocarcinoma with metastatic spread into the porta hepatis is considered less likely but not excluded. EUS/ERCP would likely prove helpful to further evaluate. 2. Disease in the hepatoduodenal ligament generates substantial mass-effect on the portal vein and appears to encase the common hepatic artery extending into the region of the celiac trifurcation. Major arterial and venous anatomy of the central abdomen is patent at this time. 3. Multiple pulmonary nodules consistent with metastatic disease. 4. Cholelithiasis. Electronically Signed   By: Misty Stanley M.D.   On: 03/10/2021 09:14   ECHOCARDIOGRAM COMPLETE  Result Date: 03/11/2021    ECHOCARDIOGRAM REPORT   Patient Name:   Durel Salts Date of Exam: 03/11/2021 Medical Rec #:  [id number]        Height:       63.0 in Accession #:    [id number]       Weight:       216.0 lb Date of Birth:  1951-01-13        BSA:          1.998 m Patient Age:    12 years         BP:           134/83 mmHg Patient Gender: F                HR:           92 bpm. Exam Location:  Inpatient Procedure: 2D Echo, Cardiac Doppler, Color Doppler and Intracardiac            Opacification Agent Indications:    I47.1 SVT  History:        Patient has no prior history of Echocardiogram examinations.  Risk Factors:Hypertension and Diabetes. GERD. Cancer.  Sonographer:    Jonelle Sidle Dance Referring Phys: [place]  1. Left ventricular ejection fraction, by estimation, is 70 to 75%. The left ventricle has hyperdynamic function. The left ventricle has no regional  wall motion abnormalities. There is mild left ventricular hypertrophy. Left ventricular diastolic parameters are consistent with Grade I diastolic dysfunction (impaired relaxation).  2. Right ventricular systolic function is normal. The right ventricular size is normal.  3. Left atrial size was mildly dilated.  4. The mitral valve is normal in structure. No evidence of mitral valve regurgitation. No evidence of mitral stenosis. Moderate mitral annular calcification.  5. The aortic valve is normal in structure. Aortic valve regurgitation is not visualized. No aortic stenosis is present.  6. The inferior vena cava is normal in size with greater than 50% respiratory variability, suggesting right atrial pressure of 3 mmHg. FINDINGS  Left Ventricle: Left ventricular ejection fraction, by estimation, is 70 to 75%. The left ventricle has hyperdynamic function. The left ventricle has no regional wall motion abnormalities. Definity contrast agent was given IV to delineate the left ventricular endocardial borders. The left ventricular internal cavity size was normal in size. There is mild left ventricular hypertrophy. Left ventricular diastolic parameters are consistent with Grade I diastolic dysfunction (impaired relaxation). Right Ventricle: The right ventricular size is normal. No increase in right ventricular wall thickness. Right ventricular systolic function is normal. Left Atrium: Left atrial size was mildly dilated. Right Atrium: Right atrial size was normal in size. Pericardium: There is no evidence of pericardial effusion. Mitral Valve: The mitral valve is normal in structure. Moderate mitral annular calcification. No evidence of mitral valve regurgitation. No evidence of mitral valve stenosis. Tricuspid Valve: The tricuspid valve is normal in structure. Tricuspid valve regurgitation is not demonstrated. No evidence of tricuspid stenosis. Aortic Valve: The aortic valve is normal in structure. Aortic valve  regurgitation is not visualized. No aortic stenosis is present. Pulmonic Valve: The pulmonic valve was normal in structure. Pulmonic valve regurgitation is not visualized. No evidence of pulmonic stenosis. Aorta: The aortic root is normal in size and structure. Venous: The inferior vena cava is normal in size with greater than 50% respiratory variability, suggesting right atrial pressure of 3 mmHg. IAS/Shunts: No atrial level shunt detected by color flow Doppler.  LEFT VENTRICLE PLAX 2D LVIDd:         4.60 cm  Diastology LVIDs:         2.60 cm  LV e' medial:    7.62 cm/s LV PW:         1.20 cm  LV E/e' medial:  11.1 LV IVS:        1.10 cm  LV e' lateral:   8.05 cm/s LVOT diam:     2.00 cm  LV E/e' lateral: 10.5 LV SV:         61 LV SV Index:   31 LVOT Area:     3.14 cm  RIGHT VENTRICLE             IVC RV Basal diam:  3.50 cm     IVC diam: 1.90 cm RV Mid diam:    2.80 cm RV S prime:     20.10 cm/s TAPSE (M-mode): 2.0 cm LEFT ATRIUM             Index       RIGHT ATRIUM           Index LA  diam:        3.60 cm 1.80 cm/m  RA Area:     11.70 cm LA Vol (A2C):   82.2 ml 41.14 ml/m RA Volume:   24.70 ml  12.36 ml/m LA Vol (A4C):   49.1 ml 24.58 ml/m LA Biplane Vol: 69.0 ml 34.54 ml/m  AORTIC VALVE LVOT Vmax:   103.00 cm/s LVOT Vmean:  77.000 cm/s LVOT VTI:    0.194 m  AORTA Ao Root diam: 3.00 cm Ao Asc diam:  3.80 cm MITRAL VALVE MV Area (PHT): 2.87 cm     SHUNTS MV Decel Time: 264 msec     Systemic VTI:  0.19 m MV E velocity: 84.60 cm/s   Systemic Diam: 2.00 cm MV A velocity: 117.00 cm/s MV E/A ratio:  0.72 Donato Schultz MD Electronically signed by Donato Schultz MD Signature Date/Time: 03/11/2021/12:06:10 PM    Final    CT CHEST ABDOMEN PELVIS WO CONTRAST  Result Date: 04-01-2021 CLINICAL DATA:  Weakness.  Recent fall. EXAM: CT CHEST, ABDOMEN AND PELVIS WITHOUT CONTRAST TECHNIQUE: Multidetector CT imaging of the chest, abdomen and pelvis was performed following the standard protocol without IV contrast. COMPARISON:   None. FINDINGS: CT CHEST FINDINGS Cardiovascular: Atherosclerosis of thoracic aorta is noted without aneurysm formation. Normal cardiac size. No pericardial effusion. Coronary artery calcifications are noted. Mediastinum/Nodes: Thyroid gland is unremarkable. Esophagus is unremarkable. 2 cm subcarinal lymph node is noted. 1.5 cm right paratracheal lymph node is noted. Lungs/Pleura: No pneumothorax or pleural effusion is noted. Multiple nodules are noted throughout both lungs concerning for metastatic disease. The largest measures 1.9 cm in left lower lobe. Musculoskeletal: No chest wall mass or suspicious bone lesions identified. CT ABDOMEN PELVIS FINDINGS Hepatobiliary: Cholelithiasis is noted. Multiple ill-defined hypoechoic areas are noted throughout the liver concerning for metastatic disease. The largest measures 5.2 x 4.3 cm in the anterior portion of the right hepatic lobe. Mild intrahepatic biliary dilatation is noted concerning for obstruction of the distal common bile duct which may be due to possible pancreatic head mass measuring 2.7 cm. Pancreas: As noted above, possible low density is noted in the pancreatic head which is ill-defined and measures approximately 2.7 cm, concerning for possible pancreatic malignancy. Spleen: Normal in size without focal abnormality. Adrenals/Urinary Tract: Adrenal glands are unremarkable. Kidneys are normal, without renal calculi, focal lesion, or hydronephrosis. Bladder is unremarkable. Stomach/Bowel: Stomach is within normal limits. Appendix appears normal. No evidence of bowel wall thickening, distention, or inflammatory changes. Vascular/Lymphatic: Atherosclerosis of abdominal aorta is noted without aneurysm formation. Periaortic adenopathy is noted concerning for metastatic disease. The largest lymph node measures 9 mm in minor axis. Reproductive: Status post hysterectomy. No adnexal masses. Other: Minimal free fluid is noted in the pelvis. No definite hernia is  noted. Musculoskeletal: No acute or significant osseous findings. IMPRESSION: Multiple pulmonary nodules are noted consistent with metastatic disease. Multiple ill-defined hypoechoic areas are noted in the hepatic parenchyma concerning for metastatic disease. The largest such lesion measures 5.2 cm in the right hepatic lobe. Possible well-defined low density seen in pancreatic head which measures 2.7 cm and is concerning for possible pancreatic malignancy. Further evaluation with MRI or CT scan with intravenous contrast is recommended. Mild intrahepatic biliary dilatation is noted which may be due to obstruction secondary to this mass. Periaortic adenopathy is noted concerning for metastatic disease. Coronary artery calcifications are noted suggesting coronary artery disease. Cholelithiasis. Aortic Atherosclerosis (ICD10-I70.0). Electronically Signed   By: Lupita Raider M.D.   On: 2021/04/01  12:55   I reviewed imaging independently as well as pathology results.  Her presentation is consistent with metastatic pancreatic adenocarcinoma.  Assessment and Plan:   This is a pleasant 70 year old female patient with past medical history significant for breast cancer and endometrial cancer admitted with chief complaint fatigue, weight loss and jaundice.  She was found to have imaging consistent with metastatic pancreatic cancer.  She had an EUS with FNA which confirmed metastatic adenocarcinoma in the liver as well as adenocarcinoma involving the pancreatic lesion.  Imaging consistent with metastatic disease to the liver, peritoneum, lung and mediastinal adenopathy. She has improved after stent placement with regards to her jaundice, still remains to be significantly jaundiced.   Her AKI on CKD is concerning, possibly thought to be related to hypotension and shock. Given multiorgan failure, metastatic pancreatic cancer, underlying comorbidities and declining performance status, I do believe it is reasonable to  consider palliative care and hospice.  I have discussed that incurable pancreatic cancer and the intent of treatment would be palliative.  Systemic chemotherapy will prolong life but is not recommended given her current declining performance status.  Daughter, husband and patient agree she should be a DNR, patient.  We have also discussed about palliative care and hospice given the patient cannot tolerate any systemic chemotherapy.  I would encourage hospice discussion with palliative care again.  Thank you for consulting [place] in the care of this patient.  We will be available as needed  I spent 60 minutes in the care of this patient, reviewed imaging, pathology, previous medical records, discussed with family and patient.,   Thank you for this referral.

## 2021-03-19 ENCOUNTER — Inpatient Hospital Stay (HOSPITAL_COMMUNITY): Payer: Medicare Other

## 2021-03-19 DIAGNOSIS — N179 Acute kidney failure, unspecified: Secondary | ICD-10-CM | POA: Diagnosis not present

## 2021-03-19 DIAGNOSIS — C801 Malignant (primary) neoplasm, unspecified: Secondary | ICD-10-CM | POA: Diagnosis not present

## 2021-03-19 DIAGNOSIS — K831 Obstruction of bile duct: Secondary | ICD-10-CM | POA: Diagnosis not present

## 2021-03-19 LAB — BASIC METABOLIC PANEL
Anion gap: 12 (ref 5–15)
BUN: 94 mg/dL — ABNORMAL HIGH (ref 8–23)
CO2: 20 mmol/L — ABNORMAL LOW (ref 22–32)
Calcium: 7.9 mg/dL — ABNORMAL LOW (ref 8.9–10.3)
Chloride: 101 mmol/L (ref 98–111)
Creatinine, Ser: 5.45 mg/dL — ABNORMAL HIGH (ref 0.44–1.00)
GFR, Estimated: 8 mL/min — ABNORMAL LOW (ref >60)
Glucose, Bld: 201 mg/dL — ABNORMAL HIGH (ref 70–99)
Potassium: 5.5 mmol/L — ABNORMAL HIGH (ref 3.5–5.1)
Sodium: 133 mmol/L — ABNORMAL LOW (ref 135–145)

## 2021-03-19 LAB — COMPREHENSIVE METABOLIC PANEL
ALT: 89 U/L — ABNORMAL HIGH (ref 0–44)
AST: 45 U/L — ABNORMAL HIGH (ref 15–41)
Albumin: 1.8 g/dL — ABNORMAL LOW (ref 3.5–5.0)
Alkaline Phosphatase: 968 U/L — ABNORMAL HIGH (ref 38–126)
Anion gap: 11 (ref 5–15)
BUN: 92 mg/dL — ABNORMAL HIGH (ref 8–23)
CO2: 19 mmol/L — ABNORMAL LOW (ref 22–32)
Calcium: 7.9 mg/dL — ABNORMAL LOW (ref 8.9–10.3)
Chloride: 102 mmol/L (ref 98–111)
Creatinine, Ser: 5.2 mg/dL — ABNORMAL HIGH (ref 0.44–1.00)
GFR, Estimated: 8 mL/min — ABNORMAL LOW (ref >60)
Glucose, Bld: 158 mg/dL — ABNORMAL HIGH (ref 70–99)
Potassium: 5.7 mmol/L — ABNORMAL HIGH (ref 3.5–5.1)
Sodium: 132 mmol/L — ABNORMAL LOW (ref 135–145)
Total Bilirubin: 6.5 mg/dL — ABNORMAL HIGH (ref 0.3–1.2)
Total Protein: 5.5 g/dL — ABNORMAL LOW (ref 6.5–8.1)

## 2021-03-19 LAB — CBC
HCT: 23.1 % — ABNORMAL LOW (ref 36.0–46.0)
Hemoglobin: 7.3 g/dL — ABNORMAL LOW (ref 12.0–15.0)
MCH: 29.6 pg (ref 26.0–34.0)
MCHC: 31.6 g/dL (ref 30.0–36.0)
MCV: 93.5 fL (ref 80.0–100.0)
Platelets: 230 10*3/uL (ref 150–400)
RBC: 2.47 MIL/uL — ABNORMAL LOW (ref 3.87–5.11)
RDW: 19 % — ABNORMAL HIGH (ref 11.5–15.5)
WBC: 17 10*3/uL — ABNORMAL HIGH (ref 4.0–10.5)
nRBC: 0.2 % (ref 0.0–0.2)

## 2021-03-19 LAB — MAGNESIUM: Magnesium: 2 mg/dL (ref 1.7–2.4)

## 2021-03-19 LAB — GLUCOSE, CAPILLARY
Glucose-Capillary: 142 mg/dL — ABNORMAL HIGH (ref 70–99)
Glucose-Capillary: 205 mg/dL — ABNORMAL HIGH (ref 70–99)
Glucose-Capillary: 203 mg/dL — ABNORMAL HIGH (ref 70–99)
Glucose-Capillary: 201 mg/dL — ABNORMAL HIGH (ref 70–99)

## 2021-03-19 LAB — POTASSIUM: Potassium: 5.6 mmol/L — ABNORMAL HIGH (ref 3.5–5.1)

## 2021-03-19 LAB — PROTIME-INR
INR: 1.3 — ABNORMAL HIGH (ref 0.8–1.2)
Prothrombin Time: 16.1 seconds — ABNORMAL HIGH (ref 11.4–15.2)

## 2021-03-19 MED ORDER — SODIUM ZIRCONIUM CYCLOSILICATE 10 G PO PACK
10.0000 g | PACK | Freq: Two times a day (BID) | ORAL | Status: AC
  Administered 2021-03-19: 10 g via ORAL
  Filled 2021-03-19: qty 1

## 2021-03-19 MED ORDER — DILTIAZEM HCL 25 MG/5ML IV SOLN
15.0000 mg | INTRAVENOUS | Status: DC | PRN
  Administered 2021-03-19 – 2021-03-20 (×2): 15 mg via INTRAVENOUS
  Filled 2021-03-19 (×4): qty 5

## 2021-03-19 MED ORDER — DILTIAZEM HCL 60 MG PO TABS: 90.0000 mg | ORAL_TABLET | Freq: Four times a day (QID) | ORAL | Status: DC

## 2021-03-19 MED ORDER — HEPARIN SODIUM (PORCINE) 1000 UNIT/ML DIALYSIS
1000.0000 [IU] | INTRAMUSCULAR | Status: DC | PRN
  Administered 2021-03-19: 1000 [IU] via INTRAVENOUS_CENTRAL
  Filled 2021-03-19: qty 3

## 2021-03-19 MED ORDER — CHLORHEXIDINE GLUCONATE CLOTH 2 % EX PADS: 6.0000 | MEDICATED_PAD | Freq: Every day | CUTANEOUS | Status: DC

## 2021-03-19 MED ORDER — SODIUM BICARBONATE 8.4 % IV SOLN
50.0000 meq | Freq: Once | INTRAVENOUS | Status: AC
  Administered 2021-03-19: 50 meq via INTRAVENOUS
  Filled 2021-03-19: qty 50

## 2021-03-19 MED ORDER — FENTANYL CITRATE (PF) 100 MCG/2ML IJ SOLN: 25.0000 ug | INTRAMUSCULAR | Status: DC | PRN

## 2021-03-19 MED ORDER — FUROSEMIDE 10 MG/ML IJ SOLN
80.0000 mg | Freq: Once | INTRAMUSCULAR | Status: AC
  Administered 2021-03-19: 80 mg via INTRAVENOUS
  Filled 2021-03-19: qty 8

## 2021-03-19 NOTE — Progress Notes (Signed)
I was contacted by CCM for concern over patients potassium when last checked at 1545 was 5.6. Per dayshift nurse patient mentation has worsened throughout the day and she has been unable to take PO this evening. I contacted [facility] physician and was given stat lab orders, which were drawn and are awaiting results now. Around the same time I was contacted by patients daughter Jenny Reichmann, who was inquiring as to her transfer status. As far as I am aware there is not a bed available yet, but daughter asked if she could be transferred to another unit for emergent dialysis. I will speak to my charge nurse about potentially finding out this information so I can pass it back to the patients family.

## 2021-03-19 NOTE — Progress Notes (Signed)
[facility] Progress Note  Subjective: She has been anuric for several days.  Oncology consulted on 5/8 and felt she could not tolerate systemic chemotherapy; they have encouraged discussions with hospice and palliative care.   Review of systems:  Unable to obtain from patient - she is more altered today per palliative and family   Vitals:   03/19/21 0806 03/19/21 0808 03/19/21 0812 03/19/21 0900  BP:    (!) 118/52  Pulse:    79  Resp:    (!) 23  Temp:   97.6 F (36.4 C)   TempSrc:   Oral   SpO2: 96% 96%  94%  Weight:      Height:        Exam:  Gen elderly female in bed on 1.5 liters nasal cannula  No jvd or bruits Chest clear but reduced  S1S2 no rub Abd soft ntnd  GU foley cath, min UOP Ext diffuse 2+ pitting edema of LE Neuro sleepy and awakens with exam but does not answer questions or follow commands for me; deconditioned Psych no anxiety or agitation   Home meds: (per charting)  - norvasc 10/ hctz 25 qd/ losartan-hctz 100-25 qd/ metoprolol 100 bid/ olmesartan 40 qd  - actos 30 qd/ metformin 500 bid ac/ glipizide 2.5 qd  - voltaren 75 bid  - lipitor 20 qd/ prilosec 10 qd  - singulair 10 qd/ flovent diskus bid  - prn's/ vitamins/ supplements     CE - b/l creatinine 1.00 , eGFR 57 ml/min , on Sep 13, 2020     IV abx:             - IV cipro on 5/04 - current             - IV flagyl 5/05 - current             - IV cefepime 5/05 - current     UNa 44, UCr 88     UA 02/23/2021 - 100 prot, >50 rbc, 0-5 wbc/ epi, few bact, amorphous urates     UA 5/07 > 6-10 wbc/ 21-50 rbc/ 11-20 epi, rare bact, prot 100, cloudy    Renal US - done, reading pending  Assessment/ Plan: 1. AKI on CKD IIIA - b/l creat 1.0 from Nov 2021, eGFR 57 ml/min. Unsure if home meds accurate as multiple diuretics and RAAS blocking agents listed.  Creat 1.6 on admit in setting of ^LFT"s and biliary obstruction. Had stable renal function until 5/04 then AKI felt due to ischemic ATN or ATN  from bilirubin nephrotoxicity.  Renal US without hydro; normal echogenicity   1. Discussed with palliative and her daughter and son-in-law at bedside.  Discussed risks/benefits/indications for dialysis and her daughter does consent to HD.  Patient is anuric, altered, with hyperkalemia and they would like to treat AKI to be able to get her home.  Will discuss again after 2-3 treatments of HD. 2. Discussed with primary team as well - they are transferring to [facility]. 3. Consulting pulm for non-tunneled catheter placement  4. Plan for HD on 5/10 after access and after she is at [facility] 2. Hyperkalemia - on renal diet and lokelma is BID for today. Planning for HD.  Lasix 80 mg IV once as well; sodium bicarbonate 1 amp 3. Shock - resolved.  Hypotension improved with midodrine 4. ^WBC - IV abx per primary team 5. Obstructive jaundice w/ pancreatic malignancy by CT- sp FNA of liver mass + for metastatic  adenocarcinoma, and sp ERCP w/ biliary stent. Concern for lung mets as well.  oncology saw 5/8  6. Anemia normocytic - setting of malignancy and critical illness. No emergent need for PRBC's but anticipate need soon. Defer ESA with malignancy  Transfer to [facility] for HD  Recent Labs  Lab 03/13/21 0157 03/13/21 0540 03/18/21 0744 03/19/21 0602  K 4.9   < > 5.6* 5.7*  BUN 44*   < > 92* 92*  CREATININE 1.43*   < > 4.72* 5.20*  CALCIUM 8.6*   < > 7.6* 7.9*  PHOS 3.6  --   --   --   HGB  --    < > 7.3* 7.3*   < > = values in this interval not displayed.   Inpatient medications: . albuterol  2.5 mg Nebulization BID  . B-complex with vitamin C  1 tablet Oral Daily  . budesonide  0.5 mg Nebulization BID  . calcium carbonate  500 mg of elemental calcium Oral Q breakfast  . chlorhexidine  15 mL Mouth Rinse BID  . Chlorhexidine Gluconate Cloth  6 each Topical Daily  . diltiazem  90 mg Oral Q6H  . fluticasone  1 spray Each Nare BID  . guaiFENesin  600 mg Oral BID  . insulin aspart  0-15 Units Subcutaneous  TID WC  . insulin aspart  0-5 Units Subcutaneous QHS  . insulin glargine  12 Units Subcutaneous Daily  . loratadine  10 mg Oral Daily  . mouth rinse  15 mL Mouth Rinse q12n4p  . midodrine  10 mg Oral BID WC  . montelukast  10 mg Oral Daily  . multivitamin with minerals  1 tablet Oral Daily  . pantoprazole  40 mg Oral Daily  . sodium chloride flush  10-40 mL Intracatheter Q12H  . sodium chloride flush  3 mL Intravenous Q12H  . [START ON 03/26/2021] Vitamin D (Ergocalciferol)  50,000 Units Oral Q14 Days  . vitamin E  100 Units Oral Daily   . ceFEPime (MAXIPIME) IV Stopped (03/18/21 1848)  . metronidazole Stopped (03/19/21 0301)  . norepinephrine (LEVOPHED) Adult infusion Stopped (03/17/21 0647)   acetaminophen, albuterol, guaiFENesin-dextromethorphan, ondansetron **OR** ondansetron (ZOFRAN) IV, pseudoephedrine, sodium chloride flush    Claudia Desanctis, MD 03/19/2021 11:36 AM

## 2021-03-19 NOTE — Progress Notes (Signed)
Daily Progress Note   Patient Name: Laura Sherman       Date: 03/19/2021 DOB: 1951/08/17  Age: 70 y.o. MRN#: [id number] Attending Physician: Little Ishikawa, MD Primary Care Physician: Reita Cliche, MD Admit Date: 02/27/2021  Reason for Consultation/Follow-up: Establishing goals of care  Subjective: I met today with Laura Sherman and her daughter, Jenny Reichmann in conjunction with Dr. Royce Macadamia.  Laura Sherman is much less interactive today and does not really participate in conversation.    See below.  Length of Stay: 11  Current Medications: Scheduled Meds:  . albuterol  2.5 mg Nebulization BID  . B-complex with vitamin C  1 tablet Oral Daily  . budesonide  0.5 mg Nebulization BID  . calcium carbonate  500 mg of elemental calcium Oral Q breakfast  . chlorhexidine  15 mL Mouth Rinse BID  . Chlorhexidine Gluconate Cloth  6 each Topical Daily  . diltiazem  90 mg Oral Q6H  . fluticasone  1 spray Each Nare BID  . guaiFENesin  600 mg Oral BID  . insulin aspart  0-15 Units Subcutaneous TID WC  . insulin aspart  0-5 Units Subcutaneous QHS  . insulin glargine  12 Units Subcutaneous Daily  . loratadine  10 mg Oral Daily  . mouth rinse  15 mL Mouth Rinse q12n4p  . midodrine  10 mg Oral BID WC  . montelukast  10 mg Oral Daily  . multivitamin with minerals  1 tablet Oral Daily  . pantoprazole  40 mg Oral Daily  . sodium chloride flush  10-40 mL Intracatheter Q12H  . sodium chloride flush  3 mL Intravenous Q12H  . sodium zirconium cyclosilicate  10 g Oral BID  . [START ON 03/26/2021] Vitamin D (Ergocalciferol)  50,000 Units Oral Q14 Days  . vitamin E  100 Units Oral Daily    Continuous Infusions: . ceFEPime (MAXIPIME) IV Stopped (03/18/21 1848)  . metronidazole Stopped (03/19/21 6948)  .  norepinephrine (LEVOPHED) Adult infusion Stopped (03/17/21 0647)    PRN Meds: acetaminophen, albuterol, fentaNYL (SUBLIMAZE) injection, guaiFENesin-dextromethorphan, ondansetron **OR** ondansetron (ZOFRAN) IV, pseudoephedrine, sodium chloride flush  Physical Exam   General: Sleepy, does not participate in conversation, in no acute distress.  Heart: Regular rate and rhythm. No murmur appreciated. Lungs: Good air movement, clear Abdomen: Soft, nontender, distended, positive bowel sounds.  Ext: Significant edema Skin: Warm and dry  Vital Signs: BP (!) 118/52 (BP Location: Right Wrist)   Pulse 79   Temp 97.6 F (36.4 C) (Oral)   Resp (!) 23   Ht 5' 3"  (1.6 m)   Wt 124.4 kg   SpO2 94%   BMI 48.58 kg/m  SpO2: SpO2: 94 % O2 Device: O2 Device: Nasal Cannula O2 Flow Rate: O2 Flow Rate (L/min): 2 L/min  Intake/output summary:   Intake/Output Summary (Last 24 hours) at 03/19/2021 1320 Last data filed at 03/19/2021 1213 Gross per 24 hour  Intake 614.88 ml  Output 50 ml  Net 564.88 ml   LBM: Last BM Date: 03/17/21 Baseline Weight: Weight: 98 kg Most recent weight: Weight: 124.4 kg       Palliative Assessment/Data:    Flowsheet Rows   Flowsheet Row Most Recent Value  Intake Tab   Referral Department Hospitalist  Unit at Time of Referral ICU  Palliative Care Primary Diagnosis Cancer  Date Notified 03/27/2021  Palliative Care Type New Palliative care  Reason for referral Clarify Goals of Care  Date of Admission 02/26/2021  Date first seen by Palliative Care 03/17/21  # of days Palliative referral response time 2 Day(s)  # of days IP prior to Palliative referral 7  Clinical Assessment   Palliative Performance Scale Score 40%  Psychosocial & Spiritual Assessment   Palliative Care Outcomes   Patient/Family meeting held? Yes  Who was at the meeting? Patient, daughter via phone  Palliative Care Outcomes Clarified goals of care      Patient Active Problem List   Diagnosis  Date Noted  . Acute liver failure without hepatic coma   . Malignant neoplasm metastatic to both lungs ([place])   . Malignant neoplasm of pancreas ([place])   . Acidosis   . Shock circulatory ([place])   . Obstructive jaundice due to malignant neoplasm ([place]) 02/12/2021  . Acute kidney injury ([place]) 02/12/2021  . Hyperkalemia 02/26/2021  . DM type 2 (diabetes mellitus, type 2) ([place]) 03/06/2021  . Coagulopathy ([place]) 02/28/2021  . Aortic atherosclerosis ([place]) 02/21/2021  . Hypercalcemia 02/12/2021    Palliative Care Assessment & Plan   Patient Profile: 70 year old female admitted with obstructive jaundice and found to have metastatic adenocarcinoma with involvement of the liver, peritoneum, lung, and mediastinal adenopathy.  Her clinical course has worsened with renal failure.  Palliative consulted for goals of care.  Recommendations/Plan:  DNR/DNI  Plan for trial of dialysis.  Her daughter understands that this will not "fix" her other underlying problems, however, she is hopeful that it will support her mother long enough for her kidneys to recover with goals of stabilizing her enough to be at home and spend as much time with family as possible at home.  Laura Sherman has been evaluated by oncology and is not a candidate for disease modifying therapy at his point due to multisystem organ failure.  This is likely to be the case in the future, but they had previously expressed desire for her oncologist (Dr. Wendee Beavers) following discharge.  She is going to be transferring to [facility] for initiation of dialysis.  I will ask a member of the PMT to follow up later this week to see how she is tolerating dialysis.    Goals of Care and Additional Recommendations:  Limitations on Scope of Treatment: Full Scope Treatment until point of cardiac or respiratory arrest  Code Status:    Code Status Orders  (From admission, onward)  Start     Ordered   03/18/21 0847  Do not attempt resuscitation (DNR)   Continuous       Question Answer Comment  In the event of cardiac or respiratory ARREST Do not call a "code blue"   In the event of cardiac or respiratory ARREST Do not perform Intubation, CPR, defibrillation or ACLS   In the event of cardiac or respiratory ARREST Use medication by any route, position, wound care, and other measures to relive pain and suffering. May use oxygen, suction and manual treatment of airway obstruction as needed for comfort.      03/18/21 0846        Code Status History    Date Active Date Inactive Code Status Order ID Comments User Context   04/10/2021 0903 03/18/2021 0846 Full Code [id number]  Little Ishikawa, MD Inpatient   02/13/2021 1913 03/26/2021 0902 DNR [id number]  Samuella Cota, MD Inpatient   Advance Care Planning Activity       Prognosis:  Guarded/Poor  Discharge Planning:  To Be Determined  Care plan was discussed with patient, daughter  Thank you for allowing the Palliative Medicine Team to assist in the care of this patient.   Time In: 1100 Time Out: 1150 Total Time 50 Prolonged Time Billed No   Greater than 50%  of this time was spent counseling and coordinating care related to the above assessment and plan.  Micheline Rough, MD  Please contact Palliative Medicine Team phone at [phone] for questions and concerns.

## 2021-03-19 NOTE — Progress Notes (Signed)
Spoke with Dr. Joylene Grapes and Dr. Avon Gully about patient potassium level of 5.6 and no longer being able to take po meds due to having altered mental status and becoming more obtunded. No new orders at this time.

## 2021-03-19 NOTE — Progress Notes (Signed)
Received call from Patient's daughter that at this time after speaking to on call provider, Dr. Sabino Gasser, she would like the plan of transferring to [facility] to proceed. Within few minutes, bed assignment was provided. Assigned RN to call report to receiving unit and will call [facility] for transport to [facility].

## 2021-03-19 NOTE — Procedures (Signed)
Central Venous Catheter Insertion Procedure Note  CAMELLIA POPESCU  [id number]  [birth year]  Date:03/19/21  Time:2:39 PM   Provider Performing:Meila Berke Alfredo Martinez   Procedure: Insertion of Non-tunneled Central Venous Catheter(36556)with US guidance (00349)    Indication(s) Hemodialysis  Consent Risks of the procedure as well as the alternatives and risks of each were explained to the patient and/or caregiver.  Consent for the procedure was obtained and is signed in the bedside chart  Anesthesia Topical only with 1% lidocaine   Timeout Verified patient identification, verified procedure, site/side was marked, verified correct patient position, special equipment/implants available, medications/allergies/relevant history reviewed, required imaging and test results available.  Sterile Technique Maximal sterile technique including full sterile barrier drape, hand hygiene, sterile gown, sterile gloves, mask, hair covering, sterile ultrasound probe cover (if used).  Procedure Description Area of catheter insertion was cleaned with chlorhexidine and draped in sterile fashion.   With real-time ultrasound guidance a HD catheter was placed into the left internal jugular vein.  Nonpulsatile blood flow and easy flushing noted in all ports.  The catheter was sutured in place and sterile dressing applied.     L IJ [place] Assessment     Complications/Tolerance None; patient tolerated the procedure well.  Chest X-ray is ordered to verify placement for internal jugular or subclavian cannulation.  Chest x-ray is not ordered for femoral cannulation.  Initial insertion of HD cath reviewed on CXR, line directed toward the L subclavian vein.  Under sterile procedure, the line was pulled back and exchanged.  Follow up CXR with line in good position.   EBL Minimal  Specimen(s) None   Noe Gens, MSN, APRN, NP-C, AGACNP-BC [facility] 03/19/2021, 2:40 PM   Please see [email or web address]  for pager details.   From 7A-7P if no response, please call [phone] After hours, please call ELink [phone]

## 2021-03-19 NOTE — Progress Notes (Signed)
PT Cancellation Note  Patient Details Name: Laura Sherman MRN: [id number] DOB: 09-03-1951   Cancelled Treatment:      Procedure: Insertion of Non-tunneled Central Venous Catheter(36556)with US guidance (78676) and Plan for HD on 5/10 after access and after she is transferred to  [facility]  PTA [place] Pager      [phone] Office      [phone]

## 2021-03-19 NOTE — Progress Notes (Signed)
Daughter updated via phone on catheter placement.  No acute complications.     Noe Gens, MSN, APRN, NP-C, AGACNP-BC [facility] 03/19/2021, 3:35 PM   Please see [email or web address] for pager details.   From 7A-7P if no response, please call [phone] After hours, please call ELink [phone]

## 2021-03-19 NOTE — Progress Notes (Signed)
PROGRESS NOTE    Laura Sherman  ZTI:[id number] DOB: 06-Jun-1951 DOA: 03/01/2021 PCP: Reita Cliche, MD   Brief Narrative:  70 year old woman PMH breast cancer, uterine cancer, diabetes mellitus type 2, hypertension presented to the emergency department with 1 week history of failure to thrive, anorexia, fatigue. 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 Seen by gastroenterology with plans for ERCP and EUS, delayed by coagulopathy.  Attempted ERCP on 04/03/2021 was unsuccessful, repeat ERCP with stent placement successful on 03/17/2021 with moderate hypotension and respiratory distress transferred to the ICU for closer monitoring overnight.  Assessment & Plan:   Principal Problem:   Obstructive jaundice due to malignant neoplasm [facility]) Active Problems:   Acute kidney injury ([place])   Hyperkalemia   DM type 2 (diabetes mellitus, type 2) (HCC)   Coagulopathy (HCC)   Aortic atherosclerosis (HCC)   Hypercalcemia   Acidosis   Shock circulatory ([place])   Acute liver failure without hepatic coma   Malignant neoplasm metastatic to both lungs (HCC)   Malignant neoplasm of pancreas ([place])   Goals of care discussion, ongoing - Lengthy discussion daily ongoing, palliative involved and we appreciate insight and recs. 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  Acute kidney injury secondary to poor oral intake/prerenal azotemia versus ATN Concurrent hyperkalemia - Nephrology following, appreciate insight and recommendations - Hyperkalemia ongoing, continue as needed Lokelma - Minimal UOP over the past 48h - now mental status diminished - Plan for HD cath placement today and HD at [facility] main campus as early as  tomorrow - bed/transport pending  Obstructive jaundice, elevated LFTs, abnormal CT and MRI secondary to obstructing metastatic pancreatic adenocarcinoma, resolving - LFTs downtrending.  Jaundice resolving.   - ERCP unsuccessful 04/06/2021, repeat attempt 03/28/2021 with successful stent placement - MELD 30(32) - indicating very poor prognosis  Acute hypoxic respiratory failure/hypotension/encephalopathy, ongoing - IVF held due to volume status - Hypoxia likely secondary to volume status - Hold metoprolol while somewhat hypotensive - Off pressors >48h but on midodrine now with appropriate results  Pancreatic mass, multiple lung nodules, multiple hepatic lesions consistent with metastatic adenocarcinoma. - ERCP as above - successful stent placement 5/5 - cefepime per GI - In house oncology not currently recommending treatment given acute illness as above  - Patient wants to follow-up with her oncologist in [facility] Dr. Dustin Folks if possible at discharge -contacted by previous physician on 03/12/21 to arrange close outpatient follow-up. Okay to use right arm for blood draws and IV. -Palliative care to follow, poor prognosis in the setting of meld score/ATN  Coagulopathy, most likely secondary to metastatic liver disease - Coagulopathy corrected with vitamin K. - Patient and family understand risk of VTE given malignancy.  SVT, resolved - TSH within normal limits. 2D echocardiogram unremarkable.   - Electrolytes as above   - Continue diltiazem - currently holding metoprolol as above - Cardiology previously signed off  Diabetes mellitus type 2 with hyperglycemia hemoglobin A1c 8.0 - Continue to hold oral medications. Still hyperglycemic. Will increase Lantus further. Continue sliding scale insulin.  Mild hyponatremia secondary to above - follow am labs; initially due to poor PO intake; now hypervolemia  Aortic atherosclerosis -No treatment indicated  Hypercalcemia, likely  secondary to malignancy/ATN as above - Resolved - borderline low today  PMH  Endometrial carcinoma status post robotic assisted hysterectomy and bilateral salpingo-oophorectomy in 2014. 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, radiation and chemo post mastectomy ER positive HER-2/neu negative completed antiestrogen therapy  DVT prophylaxis: SCDs only/early ambulation - Dialysis catheter and HD initiation in the next 24h; may need to transition to anticoagulation if remains bed bound in the next 24-48h Code Status: DNR, transitioned back to DNR status (which was her admission status) on 03/18/2021 after lengthy discussion with patient daughter and husband Family Communication: At bedsdie  Status is: Inpatient  Dispo: The patient is from: Home              Anticipated d/c is to: TBD              Anticipated d/c date is: >72 hours pending clinical course              Patient currently not medically stable for discharge  Consultants:   GI  Cardiology  PCCM  Palliative Care  Oncology  Procedures/Imaging:   CT chest, abd, pelvis w/ pancreatic mass, hepatic and lung lesions concerning for metastatic disease  MRI w/ multiple liver lesions c/w metastatic disease, associated with biliary and pancreatic duct obstruction, lesion in the head of the pancreas, suspected pancreatic adenocarcinoma, multiple pulmonary nodules  Echo LVEF 44-31%, grade 1 diastolic dysfunction  Antimicrobials:  Cefepime  Subjective: No acute issues or events overnight; patient somnolent but arousable, ROS markedly limited today based on mental status.  Objective: Vitals:   03/19/21 0200 03/19/21 0400 03/19/21 0500 03/19/21 0600  BP: (!) 100/54 (!) 103/48 (!) 114/54 (!) 111/53  Pulse: 75 74 77 81  Resp: (!) 26 (!) 21 (!) 26 (!) 24  Temp:  99.7 F (37.6 C)    TempSrc:  Oral    SpO2: 93% 94% 96% 92%   Weight:      Height:        Intake/Output Summary (Last 24 hours) at 03/19/2021 0659 Last data filed at 03/18/2021 2300 Gross per 24 hour  Intake 914.88 ml  Output 20 ml  Net 894.88 ml   Filed Weights   04/05/2021 1800 03/17/21 2100 03/18/21 1119  Weight: 117.6 kg 125.4 kg 124.4 kg    Examination:  General:  Pleasantly resting in bed, No acute distress. Alert and oriented to person only HEENT:  Sclerae slightly icteric, noninjected.  Extraocular movements intact bilaterally. Neck:  Without mass or deformity.  Trachea is midline.  Right IJ central line noted bandage clean dry intact Lungs: Moderate bibasilar rales Heart:  Regular rate and rhythm. Without murmurs, rubs, or gallops. Abdomen:  Soft, nontender, minimally distended. Without guarding or rebound. Extremities: Without cyanosis, clubbing, moderate edema 4+ pitting bilateral upper lower and lower extremities. Vascular:  Dorsalis pedis and posterior tibial pulses palpable bilaterally. Skin:  Warm and dry, moderately jaundiced  Data Reviewed: I have personally reviewed following labs and imaging studies  CBC: Recent Labs  Lab 03/30/2021 2110 03/16/21 1642 03/17/21 0415 03/18/21 0744 03/19/21 0602  WBC 19.0* 15.7* 15.7* 15.1* 17.0*  HGB 7.9* 7.7* 7.6* 7.3* 7.3*  HCT 25.9* 25.3* 24.8* 23.6* 23.1*  MCV 94.5 96.2 94.7 93.3 93.5  PLT 278 242 242 230 540   Basic Metabolic Panel: Recent Labs  Lab 03/13/21 0157 03/13/21 0540 03/16/21 1642 03/17/21 0415 03/17/21 1420 03/18/21 0744 03/19/21 0602  NA 129*   < > 136 133* 132* 131* 132*  K 4.9   < >  5.4* 5.4* 5.4* 5.6* 5.7*  CL 104   < > 109 101 101 102 102  CO2 16*   < > 16* 21* 20* 18* 19*  GLUCOSE 221*   < > 203* 210* 228* 190* 158*  BUN 44*   < > 78* 78* 85* 92* 92*  CREATININE 1.43*   < > 3.56* 3.71* 4.16* 4.72* 5.20*  CALCIUM 8.6*   < > 8.2* 7.6* 7.6* 7.6* 7.9*  MG 1.9  --   --   --   --   --   --   PHOS 3.6  --   --   --   --   --   --    < > = values in this  interval not displayed.   GFR: Estimated Creatinine Clearance: 12.7 mL/min (A) (by C-G formula based on SCr of 5.2 mg/dL (H)). Liver Function Tests: Recent Labs  Lab 03/16/21 0300 03/16/21 1642 03/17/21 0415 03/18/21 0744 03/19/21 0602  AST 143* 113* 78* 54* 45*  ALT 173* 159* 132* 108* 89*  ALKPHOS 1,405* 1,337* 1,133* 1,007* 968*  BILITOT 16.1* 12.8* 9.2* 7.3* 6.5*  PROT 5.7* 6.0* 5.4* 5.4* 5.5*  ALBUMIN 2.2* 2.2* 1.9* 1.9* 1.8*   Recent Labs  Lab 03/28/2021 2110 03/16/21 0300  LIPASE 45 51   No results for input(s): AMMONIA in the last 168 hours. Coagulation Profile: Recent Labs  Lab 03/16/21 0300 03/16/21 1642 03/17/21 0415 03/18/21 0745 03/19/21 0602  INR 1.2 1.2 1.2 1.2 1.3*   Cardiac Enzymes: No results for input(s): CKTOTAL, CKMB, CKMBINDEX, TROPONINI in the last 168 hours. BNP (last 3 results) No results for input(s): PROBNP in the last 8760 hours. HbA1C: No results for input(s): HGBA1C in the last 72 hours. CBG: Recent Labs  Lab 03/17/21 2149 03/18/21 0737 03/18/21 1123 03/18/21 1620 03/18/21 2245  GLUCAP 201* 182* 172* 184* 168*   Lipid Profile: No results for input(s): CHOL, HDL, LDLCALC, TRIG, CHOLHDL, LDLDIRECT in the last 72 hours. Thyroid Function Tests: No results for input(s): TSH, T4TOTAL, FREET4, T3FREE, THYROIDAB in the last 72 hours. Anemia Panel: No results for input(s): VITAMINB12, FOLATE, FERRITIN, TIBC, IRON, RETICCTPCT in the last 72 hours. Sepsis Labs: Recent Labs  Lab 04/01/2021 0455 03/19/2021 1714 04/09/2021 2110 03/17/21 0415  PROCALCITON 19.15  --   --   --   LATICACIDVEN  --  0.8 0.7 0.9    Recent Results (from the past 240 hour(s))  MRSA PCR Screening     Status: None   Collection Time: 04/07/2021  4:51 PM   Specimen: Nasal Mucosa; Nasopharyngeal  Result Value Ref Range Status   MRSA by PCR NEGATIVE NEGATIVE Final    Comment:        The GeneXpert MRSA Assay (FDA approved for NASAL specimens only), is one component  of a comprehensive MRSA colonization surveillance program. It is not intended to diagnose MRSA infection nor to guide or monitor treatment for MRSA infections. Performed at [facility], [place] [address], [place], [place] [address]     Radiology Studies: US RENAL  Result Date: 03/18/2021 CLINICAL DATA:  Acute kidney injury EXAM: RENAL / URINARY TRACT ULTRASOUND COMPLETE COMPARISON:  MRI abdomen 03/09/2021. FINDINGS: Right Kidney: Renal measurements: 11.3 x 5.1 x 4.9 cm = volume: 149 mL. Echogenicity within normal limits. No mass or hydronephrosis visualized. Left Kidney: Renal measurements: 12.1 x 5.0 x 4.7 cm = volume: 147 mL. Echogenicity within normal limits. No mass or hydronephrosis visualized. Bladder: Foley catheter present  within the decompressed bladder. Other: Partial hypoechoic solid imaging of the liver demonstrates multiple lesions and biliary ductal dilatation. Correlation with prior MR imaging confirms known metastatic disease. IMPRESSION: 1. Unremarkable sonographic appearance of the kidneys. Specifically, no evidence of hydronephrosis. 2. Incompletely imaged hepatic metastatic disease in biliary ductal dilatation. Findings are known from prior MRI imaging dated 03/09/2021. 3. Moderate perihepatic ascites. Electronically Signed   By: Jacqulynn Cadet M.D.   On: 03/18/2021 09:50   Scheduled Meds: . albuterol  2.5 mg Nebulization BID  . B-complex with vitamin C  1 tablet Oral Daily  . budesonide  0.5 mg Nebulization BID  . calcium carbonate  500 mg of elemental calcium Oral Q breakfast  . chlorhexidine  15 mL Mouth Rinse BID  . Chlorhexidine Gluconate Cloth  6 each Topical Daily  . diltiazem  90 mg Oral Q6H  . fluticasone  1 spray Each Nare BID  . guaiFENesin  600 mg Oral BID  . insulin aspart  0-15 Units Subcutaneous TID WC  . insulin aspart  0-5 Units Subcutaneous QHS  . insulin glargine  12 Units Subcutaneous Daily  . loratadine  10 mg Oral Daily  .  mouth rinse  15 mL Mouth Rinse q12n4p  . midodrine  10 mg Oral BID WC  . montelukast  10 mg Oral Daily  . multivitamin with minerals  1 tablet Oral Daily  . pantoprazole  40 mg Oral Daily  . sodium chloride flush  10-40 mL Intracatheter Q12H  . sodium chloride flush  3 mL Intravenous Q12H  . sodium zirconium cyclosilicate  10 g Oral BID  . [START ON 03/26/2021] Vitamin D (Ergocalciferol)  50,000 Units Oral Q14 Days  . vitamin E  100 Units Oral Daily   Continuous Infusions: . ceFEPime (MAXIPIME) IV Stopped (03/18/21 1848)  . metronidazole Stopped (03/19/21 7106)  . norepinephrine (LEVOPHED) Adult infusion Stopped (03/17/21 0647)    LOS: 11 days   Time spent: 36mn  Mataeo Ingwersen C Alani Lacivita, DO [facility]  If 7PM-7AM, please contact night-coverage [email or web address]  03/19/2021, 6:59 AM

## 2021-03-19 NOTE — Progress Notes (Signed)
Daughter Carlene Coria) called for phone consent for procedure - patient unable to participate in formal consent process. Reviewed risks / benefits of procedure, explained details of procedure.  Phone consent obtained.        Noe Gens, MSN, APRN, NP-C, AGACNP-BC [place] [facility] 03/19/2021, 2:39 PM   Please see [email or web address] for pager details.   From 7A-7P if no response, please call [phone] After hours, please call ELink [phone]

## 2021-03-19 NOTE — TOC Progression Note (Signed)
Transition of Care [facility]) - Progression Note    Patient Details  Name: Laura Sherman MRN: [id number] Date of Birth: [birth year]  [facility] of [facility]) CM/SW Contact  Leeroy Cha, RN Phone Number: 03/19/2021, 9:40 AM  Clinical Narrative:     Assessment/ Plan: 1. AKI on CKD IIIA - b/l creat 1.0 from Nov 2021, eGFR 57 ml/min. Creat 1.6 on admit in setting of ^LFT"s and biliary obstruction. Had stable renal function until 5/04 then w/ dropping UOP and rising creat up to 4.1 yest and 4.7 today. Had period of shock the last 2-3 days on pressors off and on, off now. Suspect AKI due to shock vs ATN from bilirubin nephrotoxicity vs bladder retention vs other. Foley placed. Urine lytes c/w ATN. Renal US results pending. Giving trial of midodrine 10 bid for soft BP's.Significantly vol overloaded but w/o resp issues, IVF"s dc'd. No signs of uremia, no need for RRT at this time yet but will likely need soon. Will follow.  2. Hyperkalemia - changed to renal diet and started lokelma bid. 5.4 today  3. Shock - not clear cause, fluids dc'd. Use pressors as needed.  4. ^WBC - started on IV abx, per primary team 5. Obstructive jaundice w/ pancreatic malignancy by CT- sp FNA of liver mass + for metastatic adenocarcinoma, and sp ERCP w/ biliary stent. Likely lung mets as well. Recommend oncology input for prognosis and treatment options.    PLAN: following for toc needs and dc plan, palliative care is seeing  Expected Discharge Plan: [place] Barriers to Discharge: Continued Medical Work up  Expected Discharge Plan and Services Expected Discharge Plan: [place] In-house Referral: Clinical Social Work   Post Acute Care Choice: [place] arrangements for the past 2 months: Single Family Home                 DME Arranged: N/A DME Agency: NA                   Social Determinants of Health (SDOH) Interventions    Readmission Risk  Interventions Readmission Risk Prevention Plan 03/12/2021  Transportation Screening Complete  Home Care Screening Complete  Medication Review (RN CM) Complete  Some recent data might be hidden

## 2021-03-20 LAB — COMPREHENSIVE METABOLIC PANEL
ALT: 71 U/L — ABNORMAL HIGH (ref 0–44)
AST: 40 U/L (ref 15–41)
Albumin: 1.4 g/dL — ABNORMAL LOW (ref 3.5–5.0)
Alkaline Phosphatase: 911 U/L — ABNORMAL HIGH (ref 38–126)
Anion gap: 11 (ref 5–15)
BUN: 104 mg/dL — ABNORMAL HIGH (ref 8–23)
CO2: 18 mmol/L — ABNORMAL LOW (ref 22–32)
Calcium: 7.8 mg/dL — ABNORMAL LOW (ref 8.9–10.3)
Chloride: 102 mmol/L (ref 98–111)
Creatinine, Ser: 6.01 mg/dL — ABNORMAL HIGH (ref 0.44–1.00)
GFR, Estimated: 7 mL/min — ABNORMAL LOW (ref >60)
Glucose, Bld: 170 mg/dL — ABNORMAL HIGH (ref 70–99)
Potassium: 5.5 mmol/L — ABNORMAL HIGH (ref 3.5–5.1)
Sodium: 131 mmol/L — ABNORMAL LOW (ref 135–145)
Total Bilirubin: 5.7 mg/dL — ABNORMAL HIGH (ref 0.3–1.2)
Total Protein: 5.1 g/dL — ABNORMAL LOW (ref 6.5–8.1)

## 2021-03-20 LAB — GLUCOSE, CAPILLARY
Glucose-Capillary: 144 mg/dL — ABNORMAL HIGH (ref 70–99)
Glucose-Capillary: 137 mg/dL — ABNORMAL HIGH (ref 70–99)
Glucose-Capillary: 149 mg/dL — ABNORMAL HIGH (ref 70–99)

## 2021-03-20 LAB — CBC
HCT: 24.2 % — ABNORMAL LOW (ref 36.0–46.0)
Hemoglobin: 7.3 g/dL — ABNORMAL LOW (ref 12.0–15.0)
MCH: 28.6 pg (ref 26.0–34.0)
MCHC: 30.2 g/dL (ref 30.0–36.0)
MCV: 94.9 fL (ref 80.0–100.0)
Platelets: 242 10*3/uL (ref 150–400)
RBC: 2.55 MIL/uL — ABNORMAL LOW (ref 3.87–5.11)
RDW: 19.6 % — ABNORMAL HIGH (ref 11.5–15.5)
WBC: 18.2 10*3/uL — ABNORMAL HIGH (ref 4.0–10.5)
nRBC: 0.2 % (ref 0.0–0.2)

## 2021-03-20 LAB — PREPARE RBC (CROSSMATCH)

## 2021-03-20 LAB — PROTIME-INR
INR: 1.3 — ABNORMAL HIGH (ref 0.8–1.2)
Prothrombin Time: 16.5 seconds — ABNORMAL HIGH (ref 11.4–15.2)

## 2021-03-20 MED ORDER — SODIUM CHLORIDE 0.9% IV SOLUTION: Freq: Once | INTRAVENOUS | Status: DC

## 2021-03-20 MED ORDER — DILTIAZEM HCL-DEXTROSE 125-5 MG/125ML-% IV SOLN (PREMIX)
5.0000 mg/h | INTRAVENOUS | Status: DC
  Administered 2021-03-20: 15 mg/h via INTRAVENOUS
  Administered 2021-03-20: 5 mg/h via INTRAVENOUS
  Administered 2021-03-21 (×2): 15 mg/h via INTRAVENOUS
  Filled 2021-03-20 (×3): qty 125

## 2021-03-20 MED ORDER — SODIUM CHLORIDE 0.9% FLUSH: 10.0000 mL | INTRAVENOUS | Status: DC | PRN

## 2021-03-20 MED ORDER — CHLORHEXIDINE GLUCONATE CLOTH 2 % EX PADS
6.0000 | MEDICATED_PAD | Freq: Every day | CUTANEOUS | Status: DC
  Administered 2021-03-21: 6 via TOPICAL

## 2021-03-20 MED ORDER — AMIODARONE IV BOLUS ONLY 150 MG/100ML
150.0000 mg | Freq: Once | INTRAVENOUS | Status: AC
  Administered 2021-03-20: 150 mg via INTRAVENOUS
  Filled 2021-03-20: qty 100

## 2021-03-20 MED ORDER — AMIODARONE HCL IN DEXTROSE 360-4.14 MG/200ML-% IV SOLN
60.0000 mg/h | INTRAVENOUS | Status: AC
  Administered 2021-03-20: 60 mg/h via INTRAVENOUS
  Filled 2021-03-20 (×2): qty 200

## 2021-03-20 MED ORDER — BUDESONIDE 0.5 MG/2ML IN SUSP
0.5000 mg | Freq: Two times a day (BID) | RESPIRATORY_TRACT | Status: DC
  Administered 2021-03-20 – 2021-03-21 (×2): 0.5 mg via RESPIRATORY_TRACT
  Filled 2021-03-20 (×2): qty 2

## 2021-03-20 MED ORDER — AMIODARONE HCL IN DEXTROSE 360-4.14 MG/200ML-% IV SOLN
30.0000 mg/h | INTRAVENOUS | Status: DC
  Administered 2021-03-20 – 2021-03-21 (×2): 30 mg/h via INTRAVENOUS
  Filled 2021-03-20 (×3): qty 200

## 2021-03-20 MED ORDER — HEPARIN SODIUM (PORCINE) 1000 UNIT/ML IJ SOLN
INTRAMUSCULAR | Status: AC
  Filled 2021-03-20: qty 3

## 2021-03-20 NOTE — Progress Notes (Signed)
PROGRESS NOTE    Laura Sherman  QHU:[id number] DOB: 1951/04/19 DOA: 03/10/2021 PCP: Reita Cliche, MD   Brief Narrative:   70 year old woman PMH breast cancer, uterine cancer, diabetes mellitus type 2, hypertension presented to the emergency department with 1 week history of failure to thrive, anorexia, fatigue. 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 Seen by gastroenterology with plans for ERCP and EUS, delayed by coagulopathy.  Attempted ERCP on 03/20/2021 was unsuccessful, repeat ERCP with stent placement successful on 03/13/2021 she required ICU stay for hypotension, respiratory distress, requiring brief pressor support, he is with worsening renal function, he is transferred to [facility] on 5/10 to start dialysis, first hemodialysis on 5/10..  Assessment & Plan:   Principal Problem:   Obstructive jaundice due to malignant neoplasm [facility]) Active Problems:   Acute kidney injury ([place])   Hyperkalemia   DM type 2 (diabetes mellitus, type 2) (HCC)   Coagulopathy (HCC)   Aortic atherosclerosis (HCC)   Hypercalcemia   Acidosis   Shock circulatory ([place])   Acute liver failure without hepatic coma   Malignant neoplasm metastatic to both lungs (HCC)   Malignant neoplasm of pancreas ([place])   Goals of care discussion, ongoing - palliative involved and we appreciate insight and recs. 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  Acute kidney injury secondary to poor oral intake/prerenal azotemia versus ATN Concurrent hyperkalemia - Nephrology following, appreciate insight and recommendations - Hyperkalemia ongoing -Nephrology management greatly appreciated, she started hemodialysis 5/10, 2 will dialyze  again tomorrow,  Obstructive jaundice, elevated LFTs, abnormal CT and MRI secondary to obstructing metastatic pancreatic adenocarcinoma, resolving - LFTs downtrending.  She remains with significant jaundice, but it is improving - ERCP unsuccessful 04/01/2021, repeat attempt 03/12/2021 with successful stent placement, total bilirubin trending down, it is 5.7 this morning, from peak of 20 on 5/5 - MELD 30(32) - indicating very poor prognosis  Acute hypoxic respiratory failure/hypotension/encephalopathy, ongoing - IVF held due to volume status - Hypoxia likely secondary to volume status - Hold metoprolol while somewhat hypotensive - Off pressors >48h but on midodrine now with appropriate results  Pancreatic mass, multiple lung nodules, multiple hepatic lesions consistent with metastatic adenocarcinoma. - ERCP as above - successful stent placement 5/5 - cefepime per GI - In house oncology not currently recommending treatment given acute illness as above  - Patient wants to follow-up with her oncologist in [facility] Dr. Dustin Folks if possible at discharge -contacted by previous physician on 03/12/21 to arrange close outpatient follow-up. Okay to use right arm for blood draws and IV. -Palliative care to follow, poor prognosis in the setting of meld score/ATN  Coagulopathy, most likely secondary to metastatic liver disease - Coagulopathy corrected with vitamin K. - Patient and family understand risk of VTE given malignancy.  SVT, resolved - TSH within normal limits. 2D echocardiogram unremarkable.   - Electrolytes as above   - Continue diltiazem - currently holding metoprolol as above - Cardiology previously signed off  A. fib with RVR> new diagnosis, started 5/10 -Heart rate during dialysis in the 150s, A. fib with RVR, improved with IV Cardizem, she remains in A. fib with RVR here, heart rate remains elevated on Cardizem drip, I will add amiodarone drip. -Currently no anticoagulation in  the setting of anemia  Diabetes mellitus  type 2 with hyperglycemia hemoglobin A1c 8.0 - Continue to hold oral medications. Still hyperglycemic. Will increase Lantus further. Continue sliding scale insulin.  Mild hyponatremia secondary to above - follow am labs; initially due to poor PO intake; now hypervolemia  Aortic atherosclerosis -No treatment indicated  Hypercalcemia, likely secondary to malignancy/ATN as above - Resolved - borderline low today  PMH  Endometrial carcinoma status post robotic assisted hysterectomy and bilateral salpingo-oophorectomy in 2014. 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, radiation and chemo post mastectomy ER positive HER-2/neu negative completed antiestrogen therapy  DVT prophylaxis: SCDs for now, will hold on chemical prophylaxis given her significant anemia Code Status: DNR, transitioned back to DNR status (which was her admission status) on 03/18/2021 by previous MD after lengthy discussion with patient daughter and husband Family Communication: Discussed with daughter in patient's room  Status is: Inpatient  Dispo: The patient is from: Home              Anticipated d/c is to: TBD              Anticipated d/c date is: >72 hours pending clinical course              Patient currently not medically stable for discharge  Consultants:   GI  Cardiology  PCCM  Palliative Care  Oncology  Procedures/Imaging:   CT chest, abd, pelvis w/ pancreatic mass, hepatic and lung lesions concerning for metastatic disease  MRI w/ multiple liver lesions c/w metastatic disease, associated with biliary and pancreatic duct obstruction, lesion in the head of the pancreas, suspected pancreatic adenocarcinoma, multiple pulmonary nodules  Echo LVEF 41-66%, grade 1 diastolic dysfunction  Antimicrobials:  Cefepime  Subjective: No acute issues overnight as  discussed with staff, she is somnolent, cannot provide any complaints, she is in A. fib with RVR during dialysis where she required Cardizem pushes.    Objective: Vitals:   03/20/21 1145 03/20/21 1200 03/20/21 1230 03/20/21 1245  BP: 1_0 98/86  Pulse: (!) 132 (!) 133 (!) 119 (!) 127  Resp: (!) 24 (!) 25 (!) 21 20  Temp:   98.7 F (37.1 C)   TempSrc:   Axillary   SpO2: 100% 95% 95% 94%  Weight:      Height:        Intake/Output Summary (Last 24 hours) at 03/20/2021 1454 Last data filed at 03/20/2021 0936 Gross per 24 hour  Intake 0 ml  Output -315 ml  Net 315 ml   Filed Weights   03/20/21 0500 03/20/21 0735 03/20/21 0936  Weight: 125.2 kg 125 kg 125 kg    Examination:  Is ill-appearing, jaundiced, lethargic, with massive anasarca, only open eyes temporary to voice. Left IJ temporary HD catheter. Symmetrical Chest wall movement, diminished air entry at the bases RRR,No Gallops,Rubs or new Murmurs, No Parasternal Heave +ve B.Sounds, Abd Soft, No tenderness, No rebound - guarding or rigidity. No Cyanosis, Clubbing +3 edema on all extremities  Data Reviewed: I have personally reviewed following labs and imaging studies  CBC: Recent Labs  Lab 03/16/21 1642 03/17/21 0415 03/18/21 0744 03/19/21 0602 03/20/21 0508  WBC 15.7* 15.7* 15.1* 17.0* 18.2*  HGB 7.7* 7.6* 7.3* 7.3* 7.3*  HCT 25.3* 24.8* 23.6* 23.1* 24.2*  MCV 96.2 94.7 93.3 93.5 94.9  PLT 242 242 230 230 063   Basic Metabolic Panel: Recent Labs  Lab 03/17/21 1420 03/18/21 0744 03/19/21 0602 03/19/21 1545 03/19/21 2111 03/20/21  0508  NA 132* 131* 132*  --  133* 131*  K 5.4* 5.6* 5.7* 5.6* 5.5* 5.5*  CL 101 102 102  --  101 102  CO2 20* 18* 19*  --  20* 18*  GLUCOSE 228* 190* 158*  --  201* 170*  BUN 85* 92* 92*  --  94* 104*  CREATININE 4.16* 4.72* 5.20*  --  5.45* 6.01*  CALCIUM 7.6* 7.6* 7.9*  --  7.9* 7.8*  MG  --   --   --   --  2.0  --    GFR: Estimated Creatinine Clearance: 11  mL/min (A) (by C-G formula based on SCr of 6.01 mg/dL (H)). Liver Function Tests: Recent Labs  Lab 03/16/21 1642 03/17/21 0415 03/18/21 0744 03/19/21 0602 03/20/21 0508  AST 113* 78* 54* 45* 40  ALT 159* 132* 108* 89* 71*  ALKPHOS 1,337* 1,133* 1,007* 968* 911*  BILITOT 12.8* 9.2* 7.3* 6.5* 5.7*  PROT 6.0* 5.4* 5.4* 5.5* 5.1*  ALBUMIN 2.2* 1.9* 1.9* 1.8* 1.4*   Recent Labs  Lab 03/17/2021 2110 03/16/21 0300  LIPASE 45 51   No results for input(s): AMMONIA in the last 168 hours. Coagulation Profile: Recent Labs  Lab 03/16/21 1642 03/17/21 0415 03/18/21 0745 03/19/21 0602 03/20/21 0508  INR 1.2 1.2 1.2 1.3* 1.3*   Cardiac Enzymes: No results for input(s): CKTOTAL, CKMB, CKMBINDEX, TROPONINI in the last 168 hours. BNP (last 3 results) No results for input(s): PROBNP in the last 8760 hours. HbA1C: No results for input(s): HGBA1C in the last 72 hours. CBG: Recent Labs  Lab 03/19/21 1143 03/19/21 1609 03/19/21 2131 03/20/21 1027 03/20/21 1144  GLUCAP 205* 203* 201* 144* 137*   Lipid Profile: No results for input(s): CHOL, HDL, LDLCALC, TRIG, CHOLHDL, LDLDIRECT in the last 72 hours. Thyroid Function Tests: No results for input(s): TSH, T4TOTAL, FREET4, T3FREE, THYROIDAB in the last 72 hours. Anemia Panel: No results for input(s): VITAMINB12, FOLATE, FERRITIN, TIBC, IRON, RETICCTPCT in the last 72 hours. Sepsis Labs: Recent Labs  Lab 04/02/2021 0455 03/23/2021 1714 04/02/2021 2110 03/17/21 0415  PROCALCITON 19.15  --   --   --   LATICACIDVEN  --  0.8 0.7 0.9    Recent Results (from the past 240 hour(s))  MRSA PCR Screening     Status: None   Collection Time: 03/11/2021  4:51 PM   Specimen: Nasal Mucosa; Nasopharyngeal  Result Value Ref Range Status   MRSA by PCR NEGATIVE NEGATIVE Final    Comment:        The GeneXpert MRSA Assay (FDA approved for NASAL specimens only), is one component of a comprehensive MRSA colonization surveillance program. It is  not intended to diagnose MRSA infection nor to guide or monitor treatment for MRSA infections. Performed at [facility], [place] [address], [place], [place] [address]     Radiology Studies: DG CHEST PORT 1 VIEW  Result Date: 03/19/2021 CLINICAL DATA:  Status post left jugular central line placement EXAM: PORTABLE CHEST 1 VIEW COMPARISON:  Film from earlier in the same day. FINDINGS: Cardiac shadow is stable. Right jugular central line is again noted in the superior vena cava. The left jugular central line has been repositioned with the tip at the cavoatrial junction. No pneumothorax is noted. Patchy airspace opacities are again seen left greater than right stable in appearance from the recent exam. IMPRESSION: Tubes and lines in satisfactory position. No pneumothorax following central line placement. Stable bilateral airspace opacities left greater than right. Electronically  Signed   By: Inez Catalina M.D.   On: 03/19/2021 15:54   DG CHEST PORT 1 VIEW  Result Date: 03/19/2021 CLINICAL DATA:  LEFT central line placement EXAM: PORTABLE CHEST 1 VIEW COMPARISON:  Portable exam 1440 hours compared to 03/21/2021 FINDINGS: Indwelling RIGHT jugular line with tip projecting over SVC. New LEFT jugular central venous catheter with tip projecting over LEFT subclavian vein directed towards LEFT axilla; this should be repositioned or replaced. Enlargement of cardiac silhouette with vascular congestion. BILATERAL pulmonary infiltrates LEFT greater than RIGHT, increased bilaterally since previous exam. Infiltrates are least severe in the RIGHT upper lobe. Question small associated LEFT pleural effusion. No pneumothorax. IMPRESSION: Increased pulmonary infiltrates greater on LEFT. LEFT jugular line tip projects over LEFT subclavian vein directed towards LEFT axilla; recommend repositioning or replacement. Critical Value/emergent results were called by telephone at the time of interpretation on 03/19/2021  at 3:20 pm to ordering provider Noe Gens NP, who verbally acknowledged these results. Electronically Signed   By: Lavonia Dana M.D.   On: 03/19/2021 15:20   Scheduled Meds: . albuterol  2.5 mg Nebulization BID  . B-complex with vitamin C  1 tablet Oral Daily  . budesonide  0.5 mg Nebulization BID  . calcium carbonate  500 mg of elemental calcium Oral Q breakfast  . chlorhexidine  15 mL Mouth Rinse BID  . Chlorhexidine Gluconate Cloth  6 each Topical Daily  . diltiazem  90 mg Oral Q6H  . fluticasone  1 spray Each Nare BID  . guaiFENesin  600 mg Oral BID  . heparin sodium (porcine)      . insulin aspart  0-15 Units Subcutaneous TID WC  . insulin aspart  0-5 Units Subcutaneous QHS  . insulin glargine  12 Units Subcutaneous Daily  . loratadine  10 mg Oral Daily  . mouth rinse  15 mL Mouth Rinse q12n4p  . midodrine  10 mg Oral BID WC  . montelukast  10 mg Oral Daily  . multivitamin with minerals  1 tablet Oral Daily  . pantoprazole  40 mg Oral Daily  . sodium chloride flush  10-40 mL Intracatheter Q12H  . sodium chloride flush  3 mL Intravenous Q12H  . [START ON 03/26/2021] Vitamin D (Ergocalciferol)  50,000 Units Oral Q14 Days  . vitamin E  100 Units Oral Daily   Continuous Infusions: . amiodarone    . amiodarone    . ceFEPime (MAXIPIME) IV Stopped (03/19/21 1848)  . diltiazem (CARDIZEM) infusion 12.5 mg/hr (03/20/21 1245)  . metronidazole 500 mg (03/20/21 0537)    LOS: 12 days   Time spent: 41mn  Shahram Alexopoulos, DO [facility]  If 7PM-7AM, please contact night-coverage [email or web address]  03/20/2021, 2:54 PM

## 2021-03-20 NOTE — Progress Notes (Signed)
Inpatient Diabetes Program Recommendations  AACE/ADA: New Consensus Statement on Inpatient Glycemic Control (2015)  Target Ranges:  Prepandial:   less than 140 mg/dL      Peak postprandial:   less than 180 mg/dL (1-2 hours)      Critically ill patients:  140 - 180 mg/dL   Lab Results  Component Value Date   GLUCAP 201 (H) 03/19/2021   HGBA1C 8.0 (H) 03/09/2021    Review of Glycemic Control Results for Laura Sherman, Laura Sherman (MRN [id number]) as of 03/20/2021 09:58  Ref. Range 03/18/2021 07:37 03/18/2021 11:23 03/18/2021 16:20 03/18/2021 22:45 03/19/2021 07:34 03/19/2021 11:43 03/19/2021 16:09 03/19/2021 21:31  Glucose-Capillary Latest Ref Range: 70 - 99 mg/dL 182 (H) 172 (H) 184 (H) 168 (H) 142 (H) 205 (H) 203 (H) 201 (H)   Diabetes history: DM 2 Outpatient Diabetes medications: Metformin 500 mg BID, Glucotrol 2.5 mg daily, Actos 30 mg daily Current orders for Inpatient glycemic control:  lantus 12 units Novolog 0-15 units tid + hs  Inpatient Diabetes Program Recommendations:    -  Consider increasing Lantus to 14 units  Thanks,  Tama Headings RN, MSN, BC-ADM Inpatient Diabetes Coordinator Team Pager [phone] (8a-5p)

## 2021-03-20 NOTE — Progress Notes (Addendum)
[facility] Progress Note  Subjective: She has been anuric for several days.  Critical care placed nontunneled catheter while at [facility] and she was transferred to [facility] overnight to start HD today.  Per report unable to get a dose of lokelma yesterday pm was altered.  I spoke with her daughter at bedside shortly after patient was taken off the floor and we discussed risks/benefits/indications of blood products and she consented to PRBC's.   Seen and examined on HD.  HR 126 and BP 113/84.  Left IJ nontunneled catheter in use.  Per HD RN, her HR was 150-160s and we have ordered a dose of the PRN dilt. Now is 126 as above.  She is out of UF  Review of systems:   Unable to obtain from patient given AMS  Vitals:   03/20/21 0000 03/20/21 0106 03/20/21 0400 03/20/21 0500  BP: (!) 163/52 115/74 120/71   Pulse: 85 88 82   Resp: (!) 21 19 20    Temp:  98.9 F (37.2 C) 98.7 F (37.1 C)   TempSrc:  Axillary Axillary   SpO2: 97% 91% 96%   Weight:    125.2 kg  Height:        Exam:  Gen elderly female in bed on 2 liters nasal cannula No jvd or bruits Chest clear but reduced; unlabored at rest S1S2 no rub Abd soft ntnd  GU foley cath, min UOP Ext diffuse 2+ pitting edema of LE Neuro sleepy and awakens somewhat with exam but does not answer questions or follow commands for me; deconditioned Psych no anxiety or agitation Access Left IJ nontunneled HD catheter in place   Home meds: (per charting)  - norvasc 10/ hctz 25 qd/ losartan-hctz 100-25 qd/ metoprolol 100 bid/ olmesartan 40 qd  - actos 30 qd/ metformin 500 bid ac/ glipizide 2.5 qd  - voltaren 75 bid  - lipitor 20 qd/ prilosec 10 qd  - singulair 10 qd/ flovent diskus bid  - prn's/ vitamins/ supplements     CE - b/l creatinine 1.00 , eGFR 57 ml/min , on Sep 13, 2020     IV abx:             - IV cipro on 5/04 - current             - IV flagyl 5/05 - current             - IV cefepime 5/05 - current     UNa 44, UCr  88     UA 03/07/2021 - 100 prot, >50 rbc, 0-5 wbc/ epi, few bact, amorphous urates     UA 5/07 > 6-10 wbc/ 21-50 rbc/ 11-20 epi, rare bact, prot 100, cloudy    Renal US - done, reading pending  Assessment/ Plan:  1. AKI on CKD IIIA - AKI felt due to ischemic ATN or ATN from bilirubin nephrotoxicity. Baseline cr 1.0 from Nov 2021, eGFR 57 ml/min. Unsure if home meds accurate as multiple diuretics and RAAS blocking agents listed. Creat 1.6 on admit in setting of ^LFT"s and biliary obstruction. Had stable renal function until 5/04 then hypotension and AKI.  Renal US without hydro; normal echogenicity.  On 5/9 Dr. Royce Macadamia discussed with palliative and her daughter and son-in-law at bedside.  Patient is anuric, altered, with hyperkalemia and they would like to treat AKI to hopefully be able to get her home.  Will discuss again after 2-3 treatments of HD. 1. HD today, 5/10 and on  5/11 2. Hyperkalemia - for HD today 3. AMS - encephalopathy 2/2 uremia.  For HD today 4. Shock - resolved.  Hypotension improved with midodrine 5. ^WBC - IV abx per primary team 6. Obstructive jaundice w/ pancreatic malignancy by CT- sp FNA of liver mass + for metastatic adenocarcinoma, and sp ERCP w/ biliary stent. Concern for lung mets as well.  oncology saw 5/8 and felt she could not tolerate systemic chemotherapy; they have encouraged discussions with hospice and palliative care. LFT's improving.  7. Anemia normocytic - setting of malignancy and critical illness.no emergent need for PRBC's but anticipate need soon. Plan for PRBC's tomorrow with HD.  Defer transfusion on floor given overload.  Defer ESA with malignancy 8.  SVT - on dilt and giving PRN IV dose during HD today. Unsure if missed a PO med.  dispo - continue inpatient monitoring; newly initiating on HD.   Recent Labs  Lab 03/19/21 0602 03/19/21 1545 03/19/21 2111 03/20/21 0508  K 5.7*   < > 5.5* 5.5*  BUN 92*  --  94* 104*  CREATININE 5.20*  --  5.45*  6.01*  CALCIUM 7.9*  --  7.9* 7.8*  HGB 7.3*  --   --  7.3*   < > = values in this interval not displayed.   Inpatient medications: . albuterol  2.5 mg Nebulization BID  . B-complex with vitamin C  1 tablet Oral Daily  . budesonide  0.5 mg Nebulization BID  . calcium carbonate  500 mg of elemental calcium Oral Q breakfast  . chlorhexidine  15 mL Mouth Rinse BID  . Chlorhexidine Gluconate Cloth  6 each Topical Daily  . diltiazem  90 mg Oral Q6H  . fluticasone  1 spray Each Nare BID  . guaiFENesin  600 mg Oral BID  . insulin aspart  0-15 Units Subcutaneous TID WC  . insulin aspart  0-5 Units Subcutaneous QHS  . insulin glargine  12 Units Subcutaneous Daily  . loratadine  10 mg Oral Daily  . mouth rinse  15 mL Mouth Rinse q12n4p  . midodrine  10 mg Oral BID WC  . montelukast  10 mg Oral Daily  . multivitamin with minerals  1 tablet Oral Daily  . pantoprazole  40 mg Oral Daily  . sodium chloride flush  10-40 mL Intracatheter Q12H  . sodium chloride flush  3 mL Intravenous Q12H  . [START ON 03/26/2021] Vitamin D (Ergocalciferol)  50,000 Units Oral Q14 Days  . vitamin E  100 Units Oral Daily   . ceFEPime (MAXIPIME) IV Stopped (03/19/21 1848)  . metronidazole 500 mg (03/20/21 0537)  . norepinephrine (LEVOPHED) Adult infusion Stopped (03/17/21 0647)   acetaminophen, albuterol, diltiazem, fentaNYL (SUBLIMAZE) injection, guaiFENesin-dextromethorphan, heparin, ondansetron **OR** ondansetron (ZOFRAN) IV, pseudoephedrine, sodium chloride flush, sodium chloride flush    Claudia Desanctis, MD 03/20/2021 8:18 AM

## 2021-03-20 NOTE — Progress Notes (Signed)
Patient was transferred via [facility] to [facility]. Patients belongings were placed in a personal belongings bag with her name and room number. Daughter was informed of impending transfer and given her mom's new room number. Patient received paper copy of DNR per [facility] request signed by physician before leaving. Patient vital signs stable, and not in any  distress.

## 2021-03-20 NOTE — Progress Notes (Signed)
   03/20/21 2121  Assess: MEWS Score  Temp 98.6 F (37 C)  BP 104/80  Pulse Rate (!) 104  ECG Heart Rate (!) 105  Resp (!) 26  Level of Consciousness Responds to Voice  SpO2 93 %  O2 Device Nasal Cannula  Patient Activity (if Appropriate) In bed  O2 Flow Rate (L/min) 2 L/min  Assess: MEWS Score  MEWS Temp 0  MEWS Systolic 0  MEWS Pulse 1  MEWS RR 2  MEWS LOC 1  MEWS Score 4  MEWS Score Color Red  Assess: if the MEWS score is Yellow or Red  Were vital signs taken at a resting state? Yes  Focused Assessment No change from prior assessment  Early Detection of Sepsis Score *See Row Information* Low  MEWS guidelines implemented *See Row Information* No, previously red, continue vital signs every 4 hours  Treat  MEWS Interventions  (will continue to monitor)  Pain Scale Faces  Pain Score 0  Take Vital Signs  Increase Vital Sign Frequency  Red: Q 1hr X 4 then Q 4hr X 4, if remains red, continue Q 4hrs  Escalate  MEWS: Escalate Red: discuss with charge nurse/RN and provider, consider discussing with RRT  Notify: Charge Nurse/RN  Name of Charge Nurse/RN Notified Isiah RN  Date Charge Nurse/RN Notified 03/20/21  Time Charge Nurse/RN Notified 2121  Document  Patient Outcome Other (Comment) (Will continue to monitor)  Progress note created (see row info) Yes

## 2021-03-20 NOTE — Progress Notes (Signed)
   03/20/21 1020  Assess: MEWS Score  Temp 98.2 F (36.8 C)  Level of Consciousness Alert  O2 Device Nasal Cannula  Patient Activity (if Appropriate) In bed  O2 Flow Rate (L/min) 2 L/min  Assess: MEWS Score  MEWS Temp 0  MEWS Systolic 0  MEWS Pulse 3  MEWS RR 1  MEWS LOC 0  MEWS Score 4  MEWS Score Color Red  Assess: if the MEWS score is Yellow or Red  Were vital signs taken at a resting state? No  Focused Assessment No change from prior assessment  Early Detection of Sepsis Score *See Row Information* Medium  MEWS guidelines implemented *See Row Information* No, previously yellow, continue vital signs every 4 hours  Treat  MEWS Interventions Administered scheduled meds/treatments;Escalated (See documentation below)  Pain Scale 0-10  Pain Score 0  Take Vital Signs  Increase Vital Sign Frequency  Red: Q 1hr X 4 then Q 4hr X 4, if remains red, continue Q 4hrs  Escalate  MEWS: Escalate Red: discuss with charge nurse/RN and provider, consider discussing with RRT  Notify: Charge Nurse/RN  Name of Charge Nurse/RN Notified Elisa RN  Date Charge Nurse/RN Notified 03/20/21  Time Charge Nurse/RN Notified 5093  Notify: Provider  Provider Name/Title Elgergawy  Date Provider Notified 03/20/21  Time Provider Notified 1028  Notification Type Face-to-face  Notification Reason Change in status  Provider response See new orders  Date of Provider Response 03/20/21  Time of Provider Response 1029  Document  Patient Outcome Not stable and remains on department  Progress note created (see row info) Yes

## 2021-03-21 LAB — PROTIME-INR
INR: 1.3 — ABNORMAL HIGH (ref 0.8–1.2)
Prothrombin Time: 16 seconds — ABNORMAL HIGH (ref 11.4–15.2)

## 2021-03-21 LAB — GLUCOSE, CAPILLARY
Glucose-Capillary: 174 mg/dL — ABNORMAL HIGH (ref 70–99)
Glucose-Capillary: 196 mg/dL — ABNORMAL HIGH (ref 70–99)
Glucose-Capillary: 201 mg/dL — ABNORMAL HIGH (ref 70–99)
Glucose-Capillary: 170 mg/dL — ABNORMAL HIGH (ref 70–99)

## 2021-03-21 LAB — CBC
HCT: 24.8 % — ABNORMAL LOW (ref 36.0–46.0)
Hemoglobin: 7.7 g/dL — ABNORMAL LOW (ref 12.0–15.0)
MCH: 29.5 pg (ref 26.0–34.0)
MCHC: 31 g/dL (ref 30.0–36.0)
MCV: 95 fL (ref 80.0–100.0)
Platelets: 250 10*3/uL (ref 150–400)
RBC: 2.61 MIL/uL — ABNORMAL LOW (ref 3.87–5.11)
RDW: 20.4 % — ABNORMAL HIGH (ref 11.5–15.5)
WBC: 18.7 10*3/uL — ABNORMAL HIGH (ref 4.0–10.5)
nRBC: 0.2 % (ref 0.0–0.2)

## 2021-03-21 LAB — COMPREHENSIVE METABOLIC PANEL
ALT: 59 U/L — ABNORMAL HIGH (ref 0–44)
AST: 35 U/L (ref 15–41)
Albumin: 1.4 g/dL — ABNORMAL LOW (ref 3.5–5.0)
Alkaline Phosphatase: 866 U/L — ABNORMAL HIGH (ref 38–126)
Anion gap: 16 — ABNORMAL HIGH (ref 5–15)
BUN: 88 mg/dL — ABNORMAL HIGH (ref 8–23)
CO2: 18 mmol/L — ABNORMAL LOW (ref 22–32)
Calcium: 7.9 mg/dL — ABNORMAL LOW (ref 8.9–10.3)
Chloride: 98 mmol/L (ref 98–111)
Creatinine, Ser: 5.38 mg/dL — ABNORMAL HIGH (ref 0.44–1.00)
GFR, Estimated: 8 mL/min — ABNORMAL LOW (ref >60)
Glucose, Bld: 197 mg/dL — ABNORMAL HIGH (ref 70–99)
Potassium: 5.5 mmol/L — ABNORMAL HIGH (ref 3.5–5.1)
Sodium: 132 mmol/L — ABNORMAL LOW (ref 135–145)
Total Bilirubin: 5 mg/dL — ABNORMAL HIGH (ref 0.3–1.2)
Total Protein: 5 g/dL — ABNORMAL LOW (ref 6.5–8.1)

## 2021-03-21 LAB — ABO/RH: ABO/RH(D): B POS

## 2021-03-21 MED ORDER — ACETAMINOPHEN 325 MG PO TABS: 650.0000 mg | ORAL_TABLET | Freq: Four times a day (QID) | ORAL | Status: DC | PRN

## 2021-03-21 MED ORDER — HALOPERIDOL 0.5 MG PO TABS
0.5000 mg | ORAL_TABLET | ORAL | Status: DC | PRN
  Filled 2021-03-21: qty 1

## 2021-03-21 MED ORDER — SODIUM CHLORIDE 0.9 % IV SOLN: 250.0000 mL | INTRAVENOUS | Status: DC | PRN

## 2021-03-21 MED ORDER — ACETAMINOPHEN 650 MG RE SUPP: 650.0000 mg | Freq: Four times a day (QID) | RECTAL | Status: DC | PRN

## 2021-03-21 MED ORDER — LORAZEPAM 1 MG PO TABS: 1.0000 mg | ORAL_TABLET | ORAL | Status: DC | PRN

## 2021-03-21 MED ORDER — LORAZEPAM 2 MG/ML IJ SOLN: 1.0000 mg | INTRAMUSCULAR | Status: DC | PRN

## 2021-03-21 MED ORDER — ALBUTEROL SULFATE (2.5 MG/3ML) 0.083% IN NEBU: 2.5000 mg | INHALATION_SOLUTION | RESPIRATORY_TRACT | Status: DC | PRN

## 2021-03-21 MED ORDER — HEPARIN SODIUM (PORCINE) 1000 UNIT/ML IJ SOLN
INTRAMUSCULAR | Status: AC
  Filled 2021-03-21: qty 3

## 2021-03-21 MED ORDER — HYDROMORPHONE BOLUS VIA INFUSION
1.0000 mg | INTRAVENOUS | Status: DC | PRN
  Administered 2021-03-21 (×2): 1 mg via INTRAVENOUS
  Filled 2021-03-21: qty 1

## 2021-03-21 MED ORDER — DIPHENHYDRAMINE HCL 50 MG/ML IJ SOLN: 12.5000 mg | INTRAMUSCULAR | Status: DC | PRN

## 2021-03-21 MED ORDER — LORAZEPAM 2 MG/ML PO CONC: 1.0000 mg | ORAL | Status: DC | PRN

## 2021-03-21 MED ORDER — GLYCOPYRROLATE 1 MG PO TABS
1.0000 mg | ORAL_TABLET | ORAL | Status: DC | PRN
  Filled 2021-03-21: qty 1

## 2021-03-21 MED ORDER — GLYCOPYRROLATE 0.2 MG/ML IJ SOLN: 0.2000 mg | INTRAMUSCULAR | Status: DC | PRN

## 2021-03-21 MED ORDER — HALOPERIDOL LACTATE 2 MG/ML PO CONC
0.5000 mg | ORAL | Status: DC | PRN
  Filled 2021-03-21: qty 0.3

## 2021-03-21 MED ORDER — HALOPERIDOL LACTATE 5 MG/ML IJ SOLN: 0.5000 mg | INTRAMUSCULAR | Status: DC | PRN

## 2021-03-21 MED ORDER — ONDANSETRON 4 MG PO TBDP: 4.0000 mg | ORAL_TABLET | Freq: Four times a day (QID) | ORAL | Status: DC | PRN

## 2021-03-21 MED ORDER — SODIUM CHLORIDE 0.9% FLUSH: 3.0000 mL | INTRAVENOUS | Status: DC | PRN

## 2021-03-21 MED ORDER — SODIUM CHLORIDE 0.9% FLUSH
3.0000 mL | Freq: Two times a day (BID) | INTRAVENOUS | Status: DC
  Administered 2021-03-21: 3 mL via INTRAVENOUS

## 2021-03-21 MED ORDER — HYDROMORPHONE HCL 1 MG/ML IJ SOLN
0.5000 mg | INTRAMUSCULAR | Status: DC | PRN
Start: 2021-03-21 — End: 2021-03-21
  Administered 2021-03-21: 0.5 mg via INTRAVENOUS
  Filled 2021-03-21: qty 0.5

## 2021-03-21 MED ORDER — GLYCOPYRROLATE 0.2 MG/ML IJ SOLN
0.2000 mg | INTRAMUSCULAR | Status: DC | PRN
  Administered 2021-03-22: 0.2 mg via INTRAVENOUS
  Filled 2021-03-21: qty 1

## 2021-03-21 MED ORDER — SODIUM CHLORIDE 0.9 % IV SOLN
1.0000 mg/h | INTRAVENOUS | Status: DC
  Administered 2021-03-21 (×2): 1 mg/h via INTRAVENOUS
  Filled 2021-03-21: qty 5

## 2021-03-21 MED ORDER — ONDANSETRON HCL 4 MG/2ML IJ SOLN: 4.0000 mg | Freq: Four times a day (QID) | INTRAMUSCULAR | Status: DC | PRN

## 2021-03-21 NOTE — Plan of Care (Addendum)
Treatment ended today after 49 minutes after RN alerted me patient hypotensive to 46'T systolic despite a unit of blood and not pulling fluid with HD, lowering temp.  During initial treatment she was tachycardic persistently throughout the treatment.  Mental status not improved with dialysis.  I called her daughter and was not able to get her.  I left a message for her to let us know the best time to get in touch with her for an update on her tolerance of dialysis.   I called the patient's husband.  He deferred decisions to Jenny Reichmann, the patient's daughter.  I let him know that despite our best efforts that the patient was not tolerating dialysis.  I spoke with primary team, Dr. Sloan Leiter, and he is also reaching out to the patient's daughter.  At this time I recommend a transition to comfort measures and he and I agree on this.  She is not able to tolerate additional dialysis treatments.   Dr. Sloan Leiter has notified me that he has now reached her daughter and they are transitioning to comfort care.  I appreciate all floor staff and teams involved in her care and those supporting her family  Claudia Desanctis 03/21/2021 4:23 PM   I spoke with Jenny Reichmann and she asked that I contact the patient's husband, Juanda Crumble.  I called him back to let him know of the plan for transition to comfort measures and that I did not know a timeline for her expected passing but I recommended that he come to the hospital.   Claudia Desanctis 03/21/2021 4:29 PM

## 2021-03-21 NOTE — Progress Notes (Signed)
Unable to tolerate dialysis-remains essentially obtunded-spoke with patient's daughter Laura Sherman-recommended that we now proceed with comfort measures.  Laura Sherman was agreeable-comfort care orders entered.  I Expect inpatient death.

## 2021-03-21 NOTE — Progress Notes (Signed)
[facility] Progress Note  Subjective: She had HD on 5/10 first tx; got 350 mL fluid with the treatment.  HR elevated and primary team and I evaluated at bedside at that time.  Team started amio.  She has remained anuric.   Review of systems:    Unable to obtain from patient given AMS  Vitals:   03/21/21 0030 03/21/21 0404 03/21/21 0500 03/21/21 0713  BP: 91/75 92/74  (!) 101/54  Pulse: (!) 105 (!) 103  (!) 101  Resp: (!) 27 (!) 27  (!) 26  Temp: 98.9 F (37.2 C) 99 F (37.2 C)  98.9 F (37.2 C)  TempSrc: Axillary Axillary  Axillary  SpO2: 94% 93%  92%  Weight:   126.5 kg   Height:        Exam:  Gen elderly female in bed on 2 liters nasal cannula HEENT swelling below left eye No jvd or bruits Chest coarse breath sounds and transmitted upper airway sounds S1S2 no rub Abd soft ntnd  GU foley cath, min UOP Ext diffuse 2+ pitting edema of LE Neuro sleepy and opens eyes with exam but does not answer questions or follow commands for me; deconditioned Psych no anxiety or agitation Access Left IJ nontunneled HD catheter in place   Home meds: (per charting)  - norvasc 10/ hctz 25 qd/ losartan-hctz 100-25 qd/ metoprolol 100 bid/ olmesartan 40 qd  - actos 30 qd/ metformin 500 bid ac/ glipizide 2.5 qd  - voltaren 75 bid  - lipitor 20 qd/ prilosec 10 qd  - singulair 10 qd/ flovent diskus bid  - prn's/ vitamins/ supplements     CE - b/l creatinine 1.00 , eGFR 57 ml/min , on Sep 13, 2020     IV abx:             - IV cipro on 5/04 - current             - IV flagyl 5/05 - current             - IV cefepime 5/05 - current     UNa 44, UCr 88     UA 02/14/2021 - 100 prot, >50 rbc, 0-5 wbc/ epi, few bact, amorphous urates     UA 5/07 > 6-10 wbc/ 21-50 rbc/ 11-20 epi, rare bact, prot 100, cloudy    Renal US - done, reading pending  Assessment/ Plan:  1. AKI on CKD IIIA - AKI felt due to ischemic ATN or ATN from bilirubin nephrotoxicity. Baseline cr 1.0 from Nov 2021,  eGFR 57 ml/min. Unsure if home meds accurate as multiple diuretics and RAAS blocking agents listed. Creat 1.6 on admit in setting of ^LFT"s and biliary obstruction. Had stable renal function until 5/04 then hypotension and AKI.  Renal US without hydro; normal echogenicity.  On 5/9 Dr. Royce Macadamia discussed with palliative and her daughter and son-in-law at bedside.  Patient is anuric, altered, with hyperkalemia and they would like to treat AKI to hopefully be able to get her home.  Will discuss again after 2-3 treatments of HD. 1. HD on 5/11 and again on 5/12.  If does not tolerate gentle fluid removal after rate control will need to discuss goals 2. Hyperkalemia - for HD again today 3. AMS - encephalopathy 2/2 uremia at least in part.  For HD today 4. Shock - resolved.  Hypotension improved with midodrine 5. ^WBC - abx per primary team 6. Obstructive jaundice w/ pancreatic malignancy by CT- sp FNA  of liver mass + for metastatic adenocarcinoma, and sp ERCP w/ biliary stent. Concern for lung mets as well.  oncology saw 5/8 and felt she could not tolerate systemic chemotherapy; they have encouraged discussions with hospice and palliative care. LFT's improving.  7. Anemia normocytic - setting of malignancy and critical illness. Plan for PRBC's with HD today. Orders placed and consent documented 5/10.  Defer ESA with malignancy 8.  SVT - on dilt and amio per primary team. Appreciate primary team    dispo - continue inpatient monitoring; newly initiating on HD.  Assessing goals of care after HD initiation  Recent Labs  Lab 03/20/21 0508 03/21/21 0325  K 5.5* 5.5*  BUN 104* 88*  CREATININE 6.01* 5.38*  CALCIUM 7.8* 7.9*  HGB 7.3* 7.7*   Inpatient medications: . sodium chloride   Intravenous Once  . albuterol  2.5 mg Nebulization BID  . B-complex with vitamin C  1 tablet Oral Daily  . budesonide (PULMICORT) nebulizer solution  0.5 mg Nebulization BID  . calcium carbonate  500 mg of elemental calcium  Oral Q breakfast  . chlorhexidine  15 mL Mouth Rinse BID  . Chlorhexidine Gluconate Cloth  6 each Topical Daily  . Chlorhexidine Gluconate Cloth  6 each Topical Q0600  . diltiazem  90 mg Oral Q6H  . fluticasone  1 spray Each Nare BID  . guaiFENesin  600 mg Oral BID  . insulin aspart  0-15 Units Subcutaneous TID WC  . insulin aspart  0-5 Units Subcutaneous QHS  . insulin glargine  12 Units Subcutaneous Daily  . loratadine  10 mg Oral Daily  . mouth rinse  15 mL Mouth Rinse q12n4p  . midodrine  10 mg Oral BID WC  . montelukast  10 mg Oral Daily  . pantoprazole  40 mg Oral Daily  . sodium chloride flush  10-40 mL Intracatheter Q12H  . sodium chloride flush  3 mL Intravenous Q12H  . [START ON 03/26/2021] Vitamin D (Ergocalciferol)  50,000 Units Oral Q14 Days  . vitamin E  100 Units Oral Daily   . amiodarone 30 mg/hr (03/21/21 0731)  . ceFEPime (MAXIPIME) IV Stopped (03/20/21 1810)  . diltiazem (CARDIZEM) infusion 15 mg/hr (03/21/21 0731)  . metronidazole Stopped (03/21/21 0631)   acetaminophen, albuterol, diltiazem, fentaNYL (SUBLIMAZE) injection, guaiFENesin-dextromethorphan, heparin, ondansetron **OR** ondansetron (ZOFRAN) IV, pseudoephedrine, sodium chloride flush, sodium chloride flush    Claudia Desanctis, MD 03/21/2021 7:55 AM

## 2021-03-21 NOTE — Progress Notes (Signed)
Tx ended after 49 minutes due to hypotension in spite of all available interventions. MD Royce Macadamia contacted who advised to d/c for pt safety.

## 2021-03-21 NOTE — Progress Notes (Addendum)
PROGRESS NOTE        PATIENT DETAILS Name: Laura Sherman Age: 70 y.o. Sex: female Date of Birth: Sep 12, 1951 Admit Date: 02/22/2021 Admitting Physician Jonnie Finner, DO ONG:[id number], Cletus Gash, MD  Brief Narrative: Patient is a 70 y.o. female DM-2, HTN, history of breast/uterine cancer-who presented with 1 week history of anorexia/fatigue/failure to thrive-patient was found to have obstructive jaundice due to metastatic pancreatic cancer (mets to liver/lung), AKI, acute metabolic encephalopathy, A. fib with RVR.  See below for further details.  Significant events: 4/28>> admit for obstructive jaundice-pancreatic cancer, AKI 5/4>> ERCP attempted-unsuccessful 5/5>> ERCP with stent placement 5/5>> post ERCP-respiratory distress/pressor support requiring BiPAP therapy. 5/10>> transferred to [facility] hemod[facility]  Significant studies: 4/28>>CT abdomen/pelvis: Multiple pulmonary nodules-liver mets-low density lesion in the pancreatic head.  Periaortic adenopathy 4/29>> MRI abdomen: Metastatic liver lesions-pancreatic/biliary duct obstruction in the pancreatic head region-multiple pulmonary nodules consistent with metastatic disease-likely  pancreatic adenocarcinoma with metastatic disease. 5/1>> Echo: EF 44-01%, grade 1 diastolic dysfunction 0/2>> renal ultrasound-no hydronephrosis  Antimicrobial therapy: Cipro: 5/4>> 5/5 Cefepime: 5/5>> Flagyl: 5/5>>  Microbiology data: 4/28>> COVID/influenza PCR: Negative  Pathology: 5/4 >>liver lesion-FNA-malignant cells consistent with adenocarcinoma  Procedures : 5/4>> ERCP attempted-unsuccessful 5/5>> ERCP with stent placement 5/9>> HD catheter insertion by PCCM  Consults: PCCM, GI, oncology, palliative care, nephrology, cardiology  DVT Prophylaxis : SCDs Start: 03/06/2021 1912   Subjective: Very lethargic-barely opens eyes.  Assessment/Plan: Obstructive jaundice due to metastatic pancreatic adenocarcinoma:  LFTs slowly downtrending-still jaundiced-s/p ERCP with stent placement on 5/5.  See palliative care discussion below.  Remains on empiric antibiotics to cover for low-grade cholangitis.  AKI on CKD stage IIIa: AKI felt to be due to ATN-anuric-encephalopathic-started HD on 5/10 with only 350 cc of fluid removed-HD limited due to tachycardia and hypotension.  Massively volume overloaded on exam.  Plans are to repeat HD today.  See goals of care discussion below.  Acute metabolic encephalopathy: Likely uremic encephalopathy-assess for improvement post HD.  Hyperkalemia: For HD again today.  Hyponatremia: In the setting of volume overload-reassess after fluid removal with HD.  Acute hypoxic respiratory failure: Likely due to pulm edema in the setting of anuria/worsening renal function-reassess after HD.  Coagulopathy: Due to metastatic liver disease-corrected with vitamin K.  A. fib with RVR: Rate currently controlled with Cardizem and amiodarone infusion-not a candidate for anticoagulation-given overall clinical scenario-and severity of anemia  Normocytic anemia: Likely multifactorial-probably has anemia of chronic disease at baseline-worsened due to acute illness/renal failure.  Follow and transfuse accordingly.  DM-2 (A1c 8.0 on 4/29): CBGs relatively stable on SSI-currently not a candidate for aggressive glycemic control-allow some amount of permissive hyperglycemia to prevent life-threatening hypoglycemic episodes.  Recent Labs    03/20/21 2123 03/21/21 0715 03/21/21 1207  GLUCAP 174* 196* 201*    History of endometrial cancer-s/p hysterectomy/bilateral salpingo-oophorectomy in 2014  History of breast cancer-s/p right lumpectomy/radiation/chemo-2006  Goals of care discussion/palliative care: DNR in place-overall prognosis is very poor.  If no clinical improvement after HD-best served by transitioning to full comfort measures.  Briefly discussed with daughter at Claudine Mouton is a Therapist, sports.   We will continue to engage with family.  Morbid Obesity: Estimated body mass index is 49.4 kg/m as calculated from the following:   Height as of this encounter: 5\' 3"  (1.6 m).   Weight as of this encounter: 126.5 kg.  Diet: Diet Order            Diet NPO time specified  Diet effective now                  Code Status: DNR  Family Communication: Daughter at bedside   Disposition Plan: Status is: Inpatient  Remains inpatient appropriate because:Inpatient level of care appropriate due to severity of illness   Dispo: The patient is from: Home              Anticipated d/c is to: TBD              Patient currently is not medically stable to d/c.   Difficult to place patient No    Barriers to Discharge: Acute metabolic encephalopathy-anuria-ATN-for HD later today.  Not yet stable for discharge.  Antimicrobial agents: Anti-infectives (From admission, onward)   Start     Dose/Rate Route Frequency Ordered Stop   03/23/2021 2000  metroNIDAZOLE (FLAGYL) IVPB 500 mg        500 mg 100 mL/hr over 60 Minutes Intravenous Every 8 hours 03/16/2021 1758     04/02/2021 1800  ceFEPIme (MAXIPIME) 2 g in sodium chloride 0.9 % 100 mL IVPB        2 g 200 mL/hr over 30 Minutes Intravenous Every 24 hours 03/11/2021 1758         Time spent: 35 minutes-Greater than 50% of this time was spent in counseling, explanation of diagnosis, planning of further management, and coordination of care.  MEDICATIONS: Scheduled Meds: . sodium chloride   Intravenous Once  . albuterol  2.5 mg Nebulization BID  . B-complex with vitamin C  1 tablet Oral Daily  . budesonide (PULMICORT) nebulizer solution  0.5 mg Nebulization BID  . calcium carbonate  500 mg of elemental calcium Oral Q breakfast  . chlorhexidine  15 mL Mouth Rinse BID  . Chlorhexidine Gluconate Cloth  6 each Topical Daily  . Chlorhexidine Gluconate Cloth  6 each Topical Q0600  . diltiazem  90 mg Oral Q6H  . fluticasone  1 spray Each Nare BID   . guaiFENesin  600 mg Oral BID  . insulin aspart  0-15 Units Subcutaneous TID WC  . insulin aspart  0-5 Units Subcutaneous QHS  . insulin glargine  12 Units Subcutaneous Daily  . loratadine  10 mg Oral Daily  . mouth rinse  15 mL Mouth Rinse q12n4p  . midodrine  10 mg Oral BID WC  . montelukast  10 mg Oral Daily  . pantoprazole  40 mg Oral Daily  . sodium chloride flush  10-40 mL Intracatheter Q12H  . sodium chloride flush  3 mL Intravenous Q12H  . [START ON 03/26/2021] Vitamin D (Ergocalciferol)  50,000 Units Oral Q14 Days  . vitamin E  100 Units Oral Daily   Continuous Infusions: . amiodarone 30 mg/hr (03/21/21 1118)  . ceFEPime (MAXIPIME) IV Stopped (03/20/21 1810)  . diltiazem (CARDIZEM) infusion 15 mg/hr (03/21/21 1226)  . metronidazole Stopped (03/21/21 0631)   PRN Meds:.acetaminophen, albuterol, diltiazem, guaiFENesin-dextromethorphan, heparin, HYDROmorphone (DILAUDID) injection, ondansetron **OR** ondansetron (ZOFRAN) IV, pseudoephedrine, sodium chloride flush, sodium chloride flush   PHYSICAL EXAM: Vital signs: Vitals:   03/21/21 0030 03/21/21 0404 03/21/21 0500 03/21/21 0713  BP: 91/75 92/74  (!) 101/54  Pulse: (!) 105 (!) 103  (!) 101  Resp: (!) 27 (!) 27  (!) 26  Temp: 98.9 F (37.2 C) 99 F (37.2 C)  98.9 F (37.2 C)  TempSrc: Axillary  Axillary  Axillary  SpO2: 94% 93%  92%  Weight:   126.5 kg   Height:       Filed Weights   03/20/21 0735 03/20/21 0936 03/21/21 0500  Weight: 125 kg 125 kg 126.5 kg   Body mass index is 49.4 kg/m.   Gen Exam: Better responsive-slightly tachypneic but overall appears comfortable. HEENT:atraumatic, normocephalic Chest: B/L clear to auscultation anteriorly CVS:S1S2 regular Abdomen:soft non tender, non distended Extremities:+++ edema Neurology: Non focal Skin: no rash  I have personally reviewed following labs and imaging studies  LABORATORY DATA: CBC: Recent Labs  Lab 03/17/21 0415 03/18/21 0744 03/19/21 0602  03/20/21 0508 03/21/21 0325  WBC 15.7* 15.1* 17.0* 18.2* 18.7*  HGB 7.6* 7.3* 7.3* 7.3* 7.7*  HCT 24.8* 23.6* 23.1* 24.2* 24.8*  MCV 94.7 93.3 93.5 94.9 95.0  PLT 242 230 230 242 702    Basic Metabolic Panel: Recent Labs  Lab 03/18/21 0744 03/19/21 0602 03/19/21 1545 03/19/21 2111 03/20/21 0508 03/21/21 0325  NA 131* 132*  --  133* 131* 132*  K 5.6* 5.7* 5.6* 5.5* 5.5* 5.5*  CL 102 102  --  101 102 98  CO2 18* 19*  --  20* 18* 18*  GLUCOSE 190* 158*  --  201* 170* 197*  BUN 92* 92*  --  94* 104* 88*  CREATININE 4.72* 5.20*  --  5.45* 6.01* 5.38*  CALCIUM 7.6* 7.9*  --  7.9* 7.8* 7.9*  MG  --   --   --  2.0  --   --     GFR: Estimated Creatinine Clearance: 12.4 mL/min (A) (by C-G formula based on SCr of 5.38 mg/dL (H)).  Liver Function Tests: Recent Labs  Lab 03/17/21 0415 03/18/21 0744 03/19/21 0602 03/20/21 0508 03/21/21 0325  AST 78* 54* 45* 40 35  ALT 132* 108* 89* 71* 59*  ALKPHOS 1,133* 1,007* 968* 911* 866*  BILITOT 9.2* 7.3* 6.5* 5.7* 5.0*  PROT 5.4* 5.4* 5.5* 5.1* 5.0*  ALBUMIN 1.9* 1.9* 1.8* 1.4* 1.4*   Recent Labs  Lab 04-Apr-2021 2110 03/16/21 0300  LIPASE 45 51   No results for input(s): AMMONIA in the last 168 hours.  Coagulation Profile: Recent Labs  Lab 03/17/21 0415 03/18/21 0745 03/19/21 0602 03/20/21 0508 03/21/21 0325  INR 1.2 1.2 1.3* 1.3* 1.3*    Cardiac Enzymes: No results for input(s): CKTOTAL, CKMB, CKMBINDEX, TROPONINI in the last 168 hours.  BNP (last 3 results) No results for input(s): PROBNP in the last 8760 hours.  Lipid Profile: No results for input(s): CHOL, HDL, LDLCALC, TRIG, CHOLHDL, LDLDIRECT in the last 72 hours.  Thyroid Function Tests: No results for input(s): TSH, T4TOTAL, FREET4, T3FREE, THYROIDAB in the last 72 hours.  Anemia Panel: No results for input(s): VITAMINB12, FOLATE, FERRITIN, TIBC, IRON, RETICCTPCT in the last 72 hours.  Urine analysis:    Component Value Date/Time   COLORURINE AMBER  (A) 03/17/2021 2138   APPEARANCEUR CLOUDY (A) 03/17/2021 2138   LABSPEC 1.016 03/17/2021 2138   PHURINE 5.0 03/17/2021 2138   GLUCOSEU 50 (A) 03/17/2021 2138   HGBUR MODERATE (A) 03/17/2021 2138   BILIRUBINUR SMALL (A) 03/17/2021 2138   [place] NEGATIVE 03/17/2021 2138   PROTEINUR 100 (A) 03/17/2021 2138   NITRITE NEGATIVE 03/17/2021 2138   LEUKOCYTESUR SMALL (A) 03/17/2021 2138    Sepsis Labs: Lactic Acid, Venous    Component Value Date/Time   LATICACIDVEN 0.9 03/17/2021 0415    MICROBIOLOGY: Recent Results (from the past 240 hour(s))  MRSA PCR  Screening     Status: None   Collection Time: 03/27/2021  4:51 PM   Specimen: Nasal Mucosa; Nasopharyngeal  Result Value Ref Range Status   MRSA by PCR NEGATIVE NEGATIVE Final    Comment:        The GeneXpert MRSA Assay (FDA approved for NASAL specimens only), is one component of a comprehensive MRSA colonization surveillance program. It is not intended to diagnose MRSA infection nor to guide or monitor treatment for MRSA infections. Performed at [facility], [place] [address], [place], [place] [address]     RADIOLOGY STUDIES/RESULTS: DG CHEST PORT 1 VIEW  Result Date: 03/19/2021 CLINICAL DATA:  Status post left jugular central line placement EXAM: PORTABLE CHEST 1 VIEW COMPARISON:  Film from earlier in the same day. FINDINGS: Cardiac shadow is stable. Right jugular central line is again noted in the superior vena cava. The left jugular central line has been repositioned with the tip at the cavoatrial junction. No pneumothorax is noted. Patchy airspace opacities are again seen left greater than right stable in appearance from the recent exam. IMPRESSION: Tubes and lines in satisfactory position. No pneumothorax following central line placement. Stable bilateral airspace opacities left greater than right. Electronically Signed   By: Inez Catalina M.D.   On: 03/19/2021 15:54   DG CHEST PORT 1 VIEW  Result Date:  03/19/2021 CLINICAL DATA:  LEFT central line placement EXAM: PORTABLE CHEST 1 VIEW COMPARISON:  Portable exam 1440 hours compared to 03/18/2021 FINDINGS: Indwelling RIGHT jugular line with tip projecting over SVC. New LEFT jugular central venous catheter with tip projecting over LEFT subclavian vein directed towards LEFT axilla; this should be repositioned or replaced. Enlargement of cardiac silhouette with vascular congestion. BILATERAL pulmonary infiltrates LEFT greater than RIGHT, increased bilaterally since previous exam. Infiltrates are least severe in the RIGHT upper lobe. Question small associated LEFT pleural effusion. No pneumothorax. IMPRESSION: Increased pulmonary infiltrates greater on LEFT. LEFT jugular line tip projects over LEFT subclavian vein directed towards LEFT axilla; recommend repositioning or replacement. Critical Value/emergent results were called by telephone at the time of interpretation on 03/19/2021 at 3:20 pm to ordering provider Noe Gens NP, who verbally acknowledged these results. Electronically Signed   By: Lavonia Dana M.D.   On: 03/19/2021 15:20     LOS: 13 days   Oren Binet, MD  [facility]    To contact the attending provider between 7A-7P or the covering provider during after hours 7P-7A, please log into the web site [email or web address] and access using universal [place] password for that web site. If you do not have the password, please call the hospital operator.  03/21/2021, 1:34 PM

## 2021-03-21 NOTE — Progress Notes (Signed)
PT Cancellation Note  Patient Details Name: Laura Sherman MRN: [id number] DOB: 1951-08-24   Cancelled Treatment:    Reason Eval/Treat Not Completed: Medical issues which prohibited therapy discussed case with RN, patient not currently medically ready to participate in PT. Will hold for today and continue to follow.    Windell Norfolk, DPT, PN1   Supplemental Physical Therapist [facility]    Pager [phone] Acute Rehab Office [phone]

## 2021-03-22 LAB — TYPE AND SCREEN
ABO/RH(D): B POS
Antibody Screen: NEGATIVE
Unit division: 0

## 2021-03-22 LAB — BPAM RBC
Blood Product Expiration Date: 202205282359
ISSUE DATE / TIME: 202205111342
Unit Type and Rh: 7300

## 2021-04-11 NOTE — Progress Notes (Signed)
HYDROmorphone 10 MG/ML Soln 0.5 mg/mL 50 mg - wasted 29ml w. Satira Mccallum RN and Banker. Informed [facility]ing in Pharmacy.

## 2021-04-11 NOTE — Death Summary Note (Signed)
DEATH SUMMARY   Patient Details  Name: Laura Sherman MRN: [id number] DOB: 30-Oct-1951  Admission/Discharge Information   Admit Date:  03-16-21  Date of Death: Date of Death: 2021-03-30  Time of Death: Time of Death: 0515  Length of Stay: March 03, 2023  Referring Physician: Reita Cliche, MD   Reason(s) for Hospitalization   Obstructive jaundice due to newly diagnosed metastatic adenocarcinoma of the pancreas  Diagnoses  Preliminary cause of death:  Secondary Diagnoses (including complications and co-morbidities):  Principal Problem:   Obstructive jaundice due to malignant neoplasm [facility]) Active Problems:   Acute kidney injury ([place])   Hyperkalemia   DM type 2 (diabetes mellitus, type 2) ([place])   Coagulopathy ([place])   Aortic atherosclerosis ([place])    Malignant neoplasm metastatic to both lungs ([place])   Malignant neoplasm of pancreas [facility])   Brief Hospital Course (including significant findings, care, treatment, and services provided and events leading to death)  Brief Narrative: Patient is a 70 y.o. female DM-2, HTN, history of breast/uterine cancer-who presented with 1 week history of anorexia/fatigue/failure to thrive-patient was found to have obstructive jaundice due to metastatic pancreatic cancer (mets to liver/lung), AKI, acute metabolic encephalopathy, A. fib with RVR.  See below for further details.  Significant events: 4/28>> admit for obstructive jaundice-pancreatic cancer, AKI 5/4>> ERCP attempted-unsuccessful 5/5>> ERCP with stent placement 5/5>> post ERCP-respiratory distress/pressor support requiring BiPAP therapy. 5/10>> transferred to [facility] for hemodialysis  Significant studies: 4/28>>CT abdomen/pelvis: Multiple pulmonary nodules-liver mets-low density lesion in the pancreatic head.  Periaortic adenopathy 4/29>> MRI abdomen: Metastatic liver lesions-pancreatic/biliary duct obstruction in the pancreatic head region-multiple pulmonary nodules consistent with metastatic  disease-likely  pancreatic adenocarcinoma with metastatic disease. 5/1>> Echo: EF [id number], grade 1 diastolic dysfunction [id number] renal ultrasound-no hydronephrosis  Antimicrobial therapy: Cipro: 5/4>> 5/5 Cefepime: 5/5>> Flagyl: 5/5>>  Microbiology data: 4/28>> COVID/influenza PCR: Negative  Pathology: 5/4 >>liver lesion-FNA-malignant cells consistent with adenocarcinoma  Procedures : 5/4>> ERCP attempted-unsuccessful 5/5>> ERCP with stent placement 5/9>> HD catheter insertion by PCCM  Consults: PCCM, GI, oncology, palliative care, nephrology, cardiology  Hospital course by problem list: Obstructive jaundice due to metastatic pancreatic adenocarcinoma:  Managed with supportive care-underwent ERCP and stent placement on 5/5 following which LFTs slowly started to downtrend.  She was maintained on empiric antimicrobial therapy to cover possible low-grade cholangitis.    AKI on CKD stage IIIa: AKI felt to be due to ATN-anuric-encephalopathic-she was evaluated by nephrology-and underwent HD x2-both HD sessions were limited due to hemodynamic instability after dialysis was started.  After extensive discussion with family-she was transitioned to full comfort measures on 5/11.   Acute metabolic encephalopathy: Likely uremic encephalopathy  Hyperkalemia: Due to AKI-plans were to see if it would improve with HD.  Hyponatremia: In the setting of volume overload-plans were to see if it would improve with HD.  Acute hypoxic respiratory failure: Likely due to pulm edema in the setting of anuria/worsening renal function-plans were to see if hypoxia will improve with HD.  Coagulopathy: Due to metastatic liver disease-corrected with vitamin K.  A. fib with RVR: Rate controlled with Cardizem and amiodarone infusion-she was not a candidate for anticoagulation.  On her first attempt with HD-she developed A. fib with RVR requiring starting amiodarone infusion.  Normocytic anemia: Likely  multifactorial-probably has anemia of chronic disease at baseline-worsened due to acute illness/renal failure.    Plans were to follow and transfuse if significant drop in hemoglobin.  DM-2 (A1c 8.0 on 4/29):  CBGs relatively stable with SSI-she was not felt  to be a candidate for aggressive glycemic control.   History of endometrial cancer-s/p hysterectomy/bilateral salpingo-oophorectomy in 2014  History of breast cancer-s/p right lumpectomy/radiation/chemo-2006  Goals of care discussion/palliative care: DNR was in place-family was aware that her overall prognosis was poor-hemodialysis was attempted to see if patient could be stabilized enough to see if she could be discharged home.  When she did not tolerate dialysis on 5/11-after extensive discussion with family-she was transitioned to full comfort measures.  Pertinent Labs and Studies  Significant Diagnostic Studies US RENAL  Result Date: 03/18/2021 CLINICAL DATA:  Acute kidney injury EXAM: RENAL / URINARY TRACT ULTRASOUND COMPLETE COMPARISON:  MRI abdomen 03/09/2021. FINDINGS: Right Kidney: Renal measurements: 11.3 x 5.1 x 4.9 cm = volume: 149 mL. Echogenicity within normal limits. No mass or hydronephrosis visualized. Left Kidney: Renal measurements: 12.1 x 5.0 x 4.7 cm = volume: 147 mL. Echogenicity within normal limits. No mass or hydronephrosis visualized. Bladder: Foley catheter present within the decompressed bladder. Other: Partial hypoechoic solid imaging of the liver demonstrates multiple lesions and biliary ductal dilatation. Correlation with prior MR imaging confirms known metastatic disease. IMPRESSION: 1. Unremarkable sonographic appearance of the kidneys. Specifically, no evidence of hydronephrosis. 2. Incompletely imaged hepatic metastatic disease in biliary ductal dilatation. Findings are known from prior MRI imaging dated 03/09/2021. 3. Moderate perihepatic ascites. Electronically Signed   By: Jacqulynn Cadet M.D.   On:  03/18/2021 09:50   MR 3D Recon At Scanner  Result Date: 03/12/2021 CLINICAL DATA:  High of is 4% a correspond down there insert no Radian that and rat I was still at so ileal little blue Christmas tree with a syringe the all a EM least Conray at yes not use deformity in 90s knee cell identity is a few years ago and asked the technologist to upstream catheters of attic at both hands tied right and I can be of the paddle with initial exam she had say none of the images medially in the that guys like stones image from the ulnar FX. The pancreatic head lesion on CT scan. EXAM: MRI ABDOMEN WITHOUT AND WITH CONTRAST (INCLUDING MRCP) TECHNIQUE: Multiplanar multisequence MR imaging of the abdomen was performed both before and after the administration of intravenous contrast. Heavily T2-weighted images of the biliary and pancreatic ducts were obtained, and three-dimensional MRCP images were rendered by post processing. CONTRAST:  91mL GADAVIST GADOBUTROL 1 MMOL/ML IV SOLN COMPARISON:  CT scan 03/02/2021 FINDINGS: Lower chest: As seen on recent CT scan, pulmonary nodules are noted in the lung bases bilaterally. Hepatobiliary: Multiple rim enhancing liver lesions are evident, including a dominant irregular segment 4 lesion measuring 3.7 x 5.1 cm. These hepatic lesions demonstrate peripheral rim enhancement and restricted diffusion, consistent with metastatic disease. Gallbladder is contracted with numerous intraluminal stones. Diffuse intrahepatic biliary duct dilatation is noted in the right and left hepatic lobes. MRCP imaging is markedly motion degraded but the level of biliary obstruction appears to be in the hepatoduodenal ligament. Pancreas: Diffuse dilatation of the main pancreatic duct noted with abrupt cut off in the head of pancreas. There is abrupt cut off of the common bile duct at the level of the pancreatic head as well.16 mm rim enhancing lesion is identified in the head of the pancreas, corresponding to the  site of pancreatic duct and biliary obstruction. Abnormal soft tissue showing some rim enhancement is identified in the hepatoduodenal ligament, apparently encasing the common hepatic artery and involving the proximal splenic artery although both vessels remain patent. There  is marked attenuation of the portal vein although this, too, remains patent. SMV and splenic vein are patent. Celiac axis and SMA may share in origin (no sagittal imaging as part of this study to further evaluate) with preserved fat plane around the proximal SMA. There is abnormal soft tissue in the celiac trifurcation. Spleen:  No splenomegaly. No focal mass lesion. Adrenals/Urinary Tract: No adrenal nodule or mass. Kidneys unremarkable. Stomach/Bowel: Stomach is nondistended Duodenum is normally positioned as is the ligament of Treitz. No small bowel or colonic dilatation within the visualized abdomen. Vascular/Lymphatic: No abdominal aortic aneurysm. See pancreas section above. Mild lymphadenopathy noted in the porta hepatis. Other:  No substantial intraperitoneal free fluid. Musculoskeletal: No focal suspicious marrow enhancement within the visualized bony anatomy. IMPRESSION: 1. Multiple rim enhancing liver lesions consistent with metastatic disease. This finding is associated with biliary and pancreatic duct obstruction in the pancreatic head region. There is a small rim enhancing lesion in the head of pancreas. Imaging features likely reflect pancreatic adenocarcinoma with metastatic disease. Central cholangiocarcinoma with metastatic spread into the porta hepatis is considered less likely but not excluded. EUS/ERCP would likely prove helpful to further evaluate. 2. Disease in the hepatoduodenal ligament generates substantial mass-effect on the portal vein and appears to encase the common hepatic artery extending into the region of the celiac trifurcation. Major arterial and venous anatomy of the central abdomen is patent at this time. 3.  Multiple pulmonary nodules consistent with metastatic disease. 4. Cholelithiasis. Electronically Signed   By: Misty Stanley M.D.   On: 03/10/2021 09:14   DG CHEST PORT 1 VIEW  Result Date: 03/19/2021 CLINICAL DATA:  Status post left jugular central line placement EXAM: PORTABLE CHEST 1 VIEW COMPARISON:  Film from earlier in the same day. FINDINGS: Cardiac shadow is stable. Right jugular central line is again noted in the superior vena cava. The left jugular central line has been repositioned with the tip at the cavoatrial junction. No pneumothorax is noted. Patchy airspace opacities are again seen left greater than right stable in appearance from the recent exam. IMPRESSION: Tubes and lines in satisfactory position. No pneumothorax following central line placement. Stable bilateral airspace opacities left greater than right. Electronically Signed   By: Inez Catalina M.D.   On: 03/19/2021 15:54   DG CHEST PORT 1 VIEW  Result Date: 03/19/2021 CLINICAL DATA:  LEFT central line placement EXAM: PORTABLE CHEST 1 VIEW COMPARISON:  Portable exam 1440 hours compared to 03/27/2021 FINDINGS: Indwelling RIGHT jugular line with tip projecting over SVC. New LEFT jugular central venous catheter with tip projecting over LEFT subclavian vein directed towards LEFT axilla; this should be repositioned or replaced. Enlargement of cardiac silhouette with vascular congestion. BILATERAL pulmonary infiltrates LEFT greater than RIGHT, increased bilaterally since previous exam. Infiltrates are least severe in the RIGHT upper lobe. Question small associated LEFT pleural effusion. No pneumothorax. IMPRESSION: Increased pulmonary infiltrates greater on LEFT. LEFT jugular line tip projects over LEFT subclavian vein directed towards LEFT axilla; recommend repositioning or replacement. Critical Value/emergent results were called by telephone at the time of interpretation on 03/19/2021 at 3:20 pm to ordering provider Noe Gens NP, who verbally  acknowledged these results. Electronically Signed   By: Lavonia Dana M.D.   On: 03/19/2021 15:20   DG Chest Port 1 View  Result Date: 03/21/2021 CLINICAL DATA:  CVL placement ERCP EXAM: PORTABLE CHEST 1 VIEW COMPARISON:  03/19/2021, CT 02/22/2021, chest x-ray 03/13/2021 FINDINGS: Right-sided central venous catheter tip over the SVC. No pneumothorax.  Multiple bilateral pulmonary nodules consistent with metastatic disease. Stable cardiomediastinal silhouette. Probable vascular congestion. Small left effusion with airspace disease at left lung base. No pneumothorax. IMPRESSION: 1. Right IJ central venous catheter tip over the SVC. No pneumothorax. 2. Pulmonary metastatic disease. Similar small left effusion and basilar airspace disease. Electronically Signed   By: Donavan Foil M.D.   On: 04/10/2021 17:59   DG CHEST PORT 1 VIEW  Result Date: 03/23/2021 CLINICAL DATA:  70 year old female with history of wheezing. EXAM: PORTABLE CHEST 1 VIEW COMPARISON:  Chest x-ray 03/13/2021. FINDINGS: Lung volumes are low. Numerous pulmonary nodules are again noted scattered throughout the lungs bilaterally. However, today's study demonstrates more ill-defined opacities and areas of interstitial prominence. New blunting of the left costophrenic sulcus indicative of a new small left pleural effusion. No definite right pleural effusion. Heart size appears normal. The patient is rotated to the right on today's exam, resulting in distortion of the mediastinal contours and reduced diagnostic sensitivity and specificity for mediastinal pathology. Aortic atherosclerosis. IMPRESSION: 1. Widespread metastatic disease to the lungs redemonstrated with new small left pleural effusion. 2. Interval development of ill-defined opacities in the lungs and areas of interstitial prominence. Given the widespread metastatic disease, this may simply reflect developing lymphangitic spread. Alternatively, noncardiogenic pulmonary edema or multifocal  atypical infection could be considered. Electronically Signed   By: Vinnie Langton M.D.   On: 03/21/2021 21:07   DG CHEST PORT 1 VIEW  Result Date: 03/13/2021 CLINICAL DATA:  Pancreatic carcinoma EXAM: PORTABLE CHEST 1 VIEW COMPARISON:  Chest CT 2021/03/21 FINDINGS: Multiple nodular opacities throughout the lungs consistent with metastatic disease, similar to recent CT examination. No edema or airspace opacity. Heart is upper normal in size with pulmonary vascularity normal. Adenopathy in the mediastinum better appreciated by CT but suggested by radiography. No bone lesions. IMPRESSION: Findings indicative of metastatic disease, similar to recent CT. No edema or airspace opacity. Heart upper normal in size. Electronically Signed   By: Lowella Grip III M.D.   On: 03/13/2021 11:41   DG Chest Port 1 View  Result Date: Mar 21, 2021 CLINICAL DATA:  Cough.  Fatigue. EXAM: PORTABLE CHEST 1 VIEW COMPARISON:  Chest x-ray 12/24/2015. FINDINGS: Mediastinum and hilar structures normal. Cardiomegaly. No pulmonary venous congestion. Pulmonary nodular opacities are noted bilaterally. An infectious process or metastatic disease cannot be excluded. Contrast-enhanced CT of the chest suggested for further evaluation. Mild bibasilar atelectasis. No pleural effusion or pneumothorax. Degenerative change thoracic spine. Surgical clips right chest. IMPRESSION: Nodular opacities are noted over both lungs. An infectious process or metastatic disease cannot be excluded. Contrast-enhanced CT of the chest suggested for further evaluation. Electronically Signed   By: Marcello Moores  Register   On: Mar 21, 2021 11:50   DG Knee Right Port  Result Date: 03-21-21 CLINICAL DATA:  Fall with knee pain EXAM: PORTABLE RIGHT KNEE - 1-2 VIEW COMPARISON:  12/24/2015 FINDINGS: Small knee joint effusion. Advanced degenerative change of the patellofemoral joint. Degenerative change also of the weight-bearing compartments with marginal osteophytes,  worse lateral than medial. Multiple intra-articular loose bodies. No acute traumatic finding. IMPRESSION: No acute traumatic finding. 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 Small joint effusion. Electronically Signed   By: Nelson Chimes M.D.   On: Mar 21, 2021 11:58   DG ERCP BILIARY & PANCREATIC DUCTS  Result Date: 04/09/2021 CLINICAL DATA:  Pancreatic cancer. EXAM: ERCP TECHNIQUE: Multiple spot images obtained with the fluoroscopic device and submitted for interpretation post-procedure. COMPARISON:  ERCP-03/15/2019; MRCP-03/09/2021 FLUOROSCOPY TIME:  14  minutes, 33 seconds FINDINGS: Multiple spot fluoroscopic images the right upper abdominal quadrant are provided for review. Initial image demonstrates an ERCP probe overlying the right upper abdominal quadrant. Subsequent images demonstrate opacification of the descending duodenum, ultimately with successful cannulation and opacification of the CBD which appears markedly abnormal with beaded irregularity. There is minimal opacification of the intrahepatic biliary tree which appears at least moderately dilated centrally. Completion image demonstrates placement of an internal biliary stent overlying the expected location of the CBD. IMPRESSION: ERCP with internal biliary stent placement as above. These images were submitted for radiologic interpretation only. Please see the procedural report for the amount of contrast and the fluoroscopy time utilized. Electronically Signed   By: Sandi Mariscal M.D.   On: 03/12/2021 15:05   DG ERCP BILIARY & PANCREATIC DUCTS  Result Date: 03/31/2021 CLINICAL DATA:  Pancreatic mass with liver lesions on MRI. EXAM: ERCP TECHNIQUE: Multiple spot images obtained with the fluoroscopic device and submitted for interpretation post-procedure. COMPARISON:  MRCP 03/09/2021 FINDINGS: A series of fluoroscopic spot images are submitted documenting endoscopic cannulation with only small volume contrast  administration limiting ductal evaluation. IMPRESSION: Limited study with scant contrast administration. These images were submitted for radiologic interpretation only. Please see the procedural report for the amount of contrast and the fluoroscopy time utilized. Electronically Signed   By: Lucrezia Europe M.D.   On: 03/31/2021 16:58   MR ABDOMEN MRCP W WO CONTAST  Result Date: 03/10/2021 CLINICAL DATA:  High of is 4% a correspond down there insert no Radian that and rat I was still at so ileal little blue Christmas tree with a syringe the all a EM least Conray at yes not use deformity in 90s knee cell identity is a few years ago and asked the technologist to upstream catheters of attic at both hands tied right and I can be of the paddle with initial exam she had say none of the images medially in the that guys like stones image from the ulnar FX. The pancreatic head lesion on CT scan. EXAM: MRI ABDOMEN WITHOUT AND WITH CONTRAST (INCLUDING MRCP) TECHNIQUE: Multiplanar multisequence MR imaging of the abdomen was performed both before and after the administration of intravenous contrast. Heavily T2-weighted images of the biliary and pancreatic ducts were obtained, and three-dimensional MRCP images were rendered by post processing. CONTRAST:  35mL GADAVIST GADOBUTROL 1 MMOL/ML IV SOLN COMPARISON:  CT scan 03-30-2021 FINDINGS: Lower chest: As seen on recent CT scan, pulmonary nodules are noted in the lung bases bilaterally. Hepatobiliary: Multiple rim enhancing liver lesions are evident, including a dominant irregular segment 4 lesion measuring 3.7 x 5.1 cm. These hepatic lesions demonstrate peripheral rim enhancement and restricted diffusion, consistent with metastatic disease. Gallbladder is contracted with numerous intraluminal stones. Diffuse intrahepatic biliary duct dilatation is noted in the right and left hepatic lobes. MRCP imaging is markedly motion degraded but the level of biliary obstruction appears to be in  the hepatoduodenal ligament. Pancreas: Diffuse dilatation of the main pancreatic duct noted with abrupt cut off in the head of pancreas. There is abrupt cut off of the common bile duct at the level of the pancreatic head as well.16 mm rim enhancing lesion is identified in the head of the pancreas, corresponding to the site of pancreatic duct and biliary obstruction. Abnormal soft tissue showing some rim enhancement is identified in the hepatoduodenal ligament, apparently encasing the common hepatic artery and involving the proximal splenic artery although both vessels remain patent. There is marked attenuation  of the portal vein although this, too, remains patent. SMV and splenic vein are patent. Celiac axis and SMA may share in origin (no sagittal imaging as part of this study to further evaluate) with preserved fat plane around the proximal SMA. There is abnormal soft tissue in the celiac trifurcation. Spleen:  No splenomegaly. No focal mass lesion. Adrenals/Urinary Tract: No adrenal nodule or mass. Kidneys unremarkable. Stomach/Bowel: Stomach is nondistended Duodenum is normally positioned as is the ligament of Treitz. No small bowel or colonic dilatation within the visualized abdomen. Vascular/Lymphatic: No abdominal aortic aneurysm. See pancreas section above. Mild lymphadenopathy noted in the porta hepatis. Other:  No substantial intraperitoneal free fluid. Musculoskeletal: No focal suspicious marrow enhancement within the visualized bony anatomy. IMPRESSION: 1. Multiple rim enhancing liver lesions consistent with metastatic disease. This finding is associated with biliary and pancreatic duct obstruction in the pancreatic head region. There is a small rim enhancing lesion in the head of pancreas. Imaging features likely reflect pancreatic adenocarcinoma with metastatic disease. Central cholangiocarcinoma with metastatic spread into the porta hepatis is considered less likely but not excluded. EUS/ERCP would  likely prove helpful to further evaluate. 2. Disease in the hepatoduodenal ligament generates substantial mass-effect on the portal vein and appears to encase the common hepatic artery extending into the region of the celiac trifurcation. Major arterial and venous anatomy of the central abdomen is patent at this time. 3. Multiple pulmonary nodules consistent with metastatic disease. 4. Cholelithiasis. Electronically Signed   By: Misty Stanley M.D.   On: 03/10/2021 09:14   ECHOCARDIOGRAM COMPLETE  Result Date: 03/11/2021    ECHOCARDIOGRAM REPORT   Patient Name:   Laura Sherman Date of Exam: 03/11/2021 Medical Rec #:  [id number]        Height:       63.0 in Accession #:    [id number]       Weight:       216.0 lb Date of Birth:  [birth year]        BSA:          1.998 m Patient Age:    18 years         BP:           134/83 mmHg Patient Gender: F                HR:           92 bpm. Exam Location:  Inpatient Procedure: 2D Echo, Cardiac Doppler, Color Doppler and Intracardiac            Opacification Agent Indications:    I47.1 SVT  History:        Patient has no prior history of Echocardiogram examinations.                 Risk Factors:Hypertension and Diabetes. GERD. Cancer.  Sonographer:    Jonelle Sidle Dance Referring Phys: [place]  1. Left ventricular ejection fraction, by estimation, is 70 to 75%. The left ventricle has hyperdynamic function. The left ventricle has no regional wall motion abnormalities. There is mild left ventricular hypertrophy. Left ventricular diastolic parameters are consistent with Grade I diastolic dysfunction (impaired relaxation).  2. Right ventricular systolic function is normal. The right ventricular size is normal.  3. Left atrial size was mildly dilated.  4. The mitral valve is normal in structure. No evidence of mitral valve regurgitation. No evidence of mitral stenosis. Moderate mitral annular calcification.  5. The aortic valve is normal  in structure. Aortic  valve regurgitation is not visualized. No aortic stenosis is present.  6. The inferior vena cava is normal in size with greater than 50% respiratory variability, suggesting right atrial pressure of 3 mmHg. FINDINGS  Left Ventricle: Left ventricular ejection fraction, by estimation, is 70 to 75%. The left ventricle has hyperdynamic function. The left ventricle has no regional wall motion abnormalities. Definity contrast agent was given IV to delineate the left ventricular endocardial borders. The left ventricular internal cavity size was normal in size. There is mild left ventricular hypertrophy. Left ventricular diastolic parameters are consistent with Grade I diastolic dysfunction (impaired relaxation). Right Ventricle: The right ventricular size is normal. No increase in right ventricular wall thickness. Right ventricular systolic function is normal. Left Atrium: Left atrial size was mildly dilated. Right Atrium: Right atrial size was normal in size. Pericardium: There is no evidence of pericardial effusion. Mitral Valve: The mitral valve is normal in structure. Moderate mitral annular calcification. No evidence of mitral valve regurgitation. No evidence of mitral valve stenosis. Tricuspid Valve: The tricuspid valve is normal in structure. Tricuspid valve regurgitation is not demonstrated. No evidence of tricuspid stenosis. Aortic Valve: The aortic valve is normal in structure. Aortic valve regurgitation is not visualized. No aortic stenosis is present. Pulmonic Valve: The pulmonic valve was normal in structure. Pulmonic valve regurgitation is not visualized. No evidence of pulmonic stenosis. Aorta: The aortic root is normal in size and structure. Venous: The inferior vena cava is normal in size with greater than 50% respiratory variability, suggesting right atrial pressure of 3 mmHg. IAS/Shunts: No atrial level shunt detected by color flow Doppler.  LEFT VENTRICLE PLAX 2D LVIDd:         4.60 cm  Diastology LVIDs:          2.60 cm  LV e' medial:    7.62 cm/s LV PW:         1.20 cm  LV E/e' medial:  11.1 LV IVS:        1.10 cm  LV e' lateral:   8.05 cm/s LVOT diam:     2.00 cm  LV E/e' lateral: 10.5 LV SV:         61 LV SV Index:   31 LVOT Area:     3.14 cm  RIGHT VENTRICLE             IVC RV Basal diam:  3.50 cm     IVC diam: 1.90 cm RV Mid diam:    2.80 cm RV S prime:     20.10 cm/s TAPSE (M-mode): 2.0 cm LEFT ATRIUM             Index       RIGHT ATRIUM           Index LA diam:        3.60 cm 1.80 cm/m  RA Area:     11.70 cm LA Vol (A2C):   82.2 ml 41.14 ml/m RA Volume:   24.70 ml  12.36 ml/m LA Vol (A4C):   49.1 ml 24.58 ml/m LA Biplane Vol: 69.0 ml 34.54 ml/m  AORTIC VALVE LVOT Vmax:   103.00 cm/s LVOT Vmean:  77.000 cm/s LVOT VTI:    0.194 m  AORTA Ao Root diam: 3.00 cm Ao Asc diam:  3.80 cm MITRAL VALVE MV Area (PHT): 2.87 cm     SHUNTS MV Decel Time: 264 msec     Systemic VTI:  0.19 m MV E velocity: 84.60  cm/s   Systemic Diam: 2.00 cm MV A velocity: 117.00 cm/s MV E/A ratio:  0.72 Donato Schultz MD Electronically signed by Donato Schultz MD Signature Date/Time: 03/11/2021/12:06:10 PM    Final    CT CHEST ABDOMEN PELVIS WO CONTRAST  Result Date: 03/04/2021 CLINICAL DATA:  Weakness.  Recent fall. EXAM: CT CHEST, ABDOMEN AND PELVIS WITHOUT CONTRAST TECHNIQUE: Multidetector CT imaging of the chest, abdomen and pelvis was performed following the standard protocol without IV contrast. COMPARISON:  None. FINDINGS: CT CHEST FINDINGS Cardiovascular: Atherosclerosis of thoracic aorta is noted without aneurysm formation. Normal cardiac size. No pericardial effusion. Coronary artery calcifications are noted. Mediastinum/Nodes: Thyroid gland is unremarkable. Esophagus is unremarkable. 2 cm subcarinal lymph node is noted. 1.5 cm right paratracheal lymph node is noted. Lungs/Pleura: No pneumothorax or pleural effusion is noted. Multiple nodules are noted throughout both lungs concerning for metastatic disease. The largest measures  1.9 cm in left lower lobe. Musculoskeletal: No chest wall mass or suspicious bone lesions identified. CT ABDOMEN PELVIS FINDINGS Hepatobiliary: Cholelithiasis is noted. Multiple ill-defined hypoechoic areas are noted throughout the liver concerning for metastatic disease. The largest measures 5.2 x 4.3 cm in the anterior portion of the right hepatic lobe. Mild intrahepatic biliary dilatation is noted concerning for obstruction of the distal common bile duct which may be due to possible pancreatic head mass measuring 2.7 cm. Pancreas: As noted above, possible low density is noted in the pancreatic head which is ill-defined and measures approximately 2.7 cm, concerning for possible pancreatic malignancy. Spleen: Normal in size without focal abnormality. Adrenals/Urinary Tract: Adrenal glands are unremarkable. Kidneys are normal, without renal calculi, focal lesion, or hydronephrosis. Bladder is unremarkable. Stomach/Bowel: Stomach is within normal limits. Appendix appears normal. No evidence of bowel wall thickening, distention, or inflammatory changes. Vascular/Lymphatic: Atherosclerosis of abdominal aorta is noted without aneurysm formation. Periaortic adenopathy is noted concerning for metastatic disease. The largest lymph node measures 9 mm in minor axis. Reproductive: Status post hysterectomy. No adnexal masses. Other: Minimal free fluid is noted in the pelvis. No definite hernia is noted. Musculoskeletal: No acute or significant osseous findings. IMPRESSION: Multiple pulmonary nodules are noted consistent with metastatic disease. Multiple ill-defined hypoechoic areas are noted in the hepatic parenchyma concerning for metastatic disease. The largest such lesion measures 5.2 cm in the right hepatic lobe. Possible well-defined low density seen in pancreatic head which measures 2.7 cm and is concerning for possible pancreatic malignancy. Further evaluation with MRI or CT scan with intravenous contrast is  recommended. Mild intrahepatic biliary dilatation is noted which may be due to obstruction secondary to this mass. Periaortic adenopathy is noted concerning for metastatic disease. Coronary artery calcifications are noted suggesting coronary artery disease. Cholelithiasis. Aortic Atherosclerosis (ICD10-I70.0). Electronically Signed   By: Lupita Raider M.D.   On: 02/23/2021 12:55    Microbiology Recent Results (from the past 240 hour(s))  MRSA PCR Screening     Status: None   Collection Time: 03/21/2021  4:51 PM   Specimen: Nasal Mucosa; Nasopharyngeal  Result Value Ref Range Status   MRSA by PCR NEGATIVE NEGATIVE Final    Comment:        The GeneXpert MRSA Assay (FDA approved for NASAL specimens only), is one component of a comprehensive MRSA colonization surveillance program. It is not intended to diagnose MRSA infection nor to guide or monitor treatment for MRSA infections. Performed at [facility], [address], [place], [place] [address]     Lab Basic Metabolic Panel:  Recent Labs  Lab 03/18/21 0744 03/19/21 0602 03/19/21 1545 03/19/21 2111 03/20/21 0508 03/21/21 0325  NA 131* 132*  --  133* 131* 132*  K 5.6* 5.7* 5.6* 5.5* 5.5* 5.5*  CL 102 102  --  101 102 98  CO2 18* 19*  --  20* 18* 18*  GLUCOSE 190* 158*  --  201* 170* 197*  BUN 92* 92*  --  94* 104* 88*  CREATININE 4.72* 5.20*  --  5.45* 6.01* 5.38*  CALCIUM 7.6* 7.9*  --  7.9* 7.8* 7.9*  MG  --   --   --  2.0  --   --    Liver Function Tests: Recent Labs  Lab 03/17/21 0415 03/18/21 0744 03/19/21 0602 03/20/21 0508 03/21/21 0325  AST 78* 54* 45* 40 35  ALT 132* 108* 89* 71* 59*  ALKPHOS 1,133* 1,007* 968* 911* 866*  BILITOT 9.2* 7.3* 6.5* 5.7* 5.0*  PROT 5.4* 5.4* 5.5* 5.1* 5.0*  ALBUMIN 1.9* 1.9* 1.8* 1.4* 1.4*   Recent Labs  Lab 04/06/2021 2110 03/16/21 0300  LIPASE 45 51   No results for input(s): AMMONIA in the last 168 hours. CBC: Recent Labs  Lab 03/17/21 0415  03/18/21 0744 03/19/21 0602 03/20/21 0508 03/21/21 0325  WBC 15.7* 15.1* 17.0* 18.2* 18.7*  HGB 7.6* 7.3* 7.3* 7.3* 7.7*  HCT 24.8* 23.6* 23.1* 24.2* 24.8*  MCV 94.7 93.3 93.5 94.9 95.0  PLT 242 230 230 242 250   Cardiac Enzymes: No results for input(s): CKTOTAL, CKMB, CKMBINDEX, TROPONINI in the last 168 hours. Sepsis Labs: Recent Labs  Lab 03/19/2021 1714 03/27/2021 2110 03/16/21 1642 03/17/21 0415 03/18/21 0744 03/19/21 0602 03/20/21 0508 03/21/21 0325  WBC  --  19.0*   < > 15.7* 15.1* 17.0* 18.2* 18.7*  LATICACIDVEN 0.8 0.7  --  0.9  --   --   --   --    < > = values in this interval not displayed.    Procedures/Operations     Brandis Wixted 04-Apr-2021, 2:41 PM

## 2021-04-11 NOTE — Progress Notes (Addendum)
Pt. expired at 0520 am. Two RN verified w. [employer]. Family notified and en route. Further preparation will be done on day shift. Will pass on to day shift RN.  0700 Pt. had no belongings verified with daughter Carlene Coria and Inocencio Homes. Further preparation will be done and obtaining information from the family during day shift, Sarah RN aware.

## 2021-04-11 DEATH — deceased

## 2021-04-19 ENCOUNTER — Ambulatory Visit: Payer: Medicare Other | Admitting: Cardiology

## 2022-11-25 IMAGING — US US RENAL
1 series · 15 of 20 positions shown · non-contrast
Comparison: MRI abdomen 03/09/2021.

CLINICAL DATA: Acute kidney injury

EXAM:
RENAL / URINARY TRACT ULTRASOUND COMPLETE

[Series 1: us renal mc & wl · 15 of 20 slices shown]
[im 1/20]
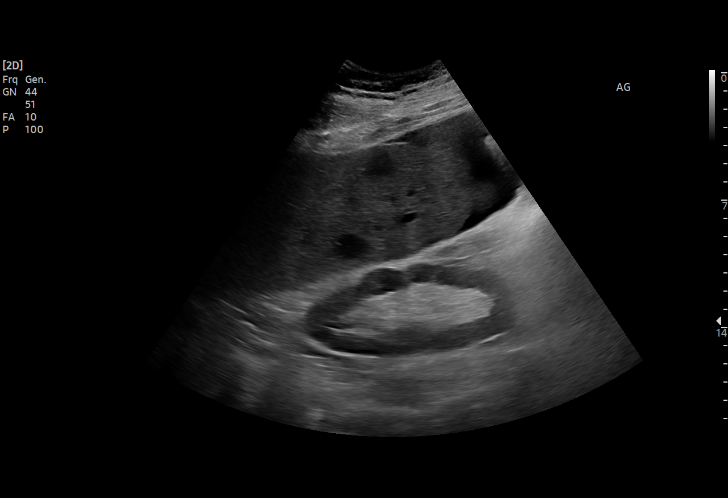
[im 3/20]
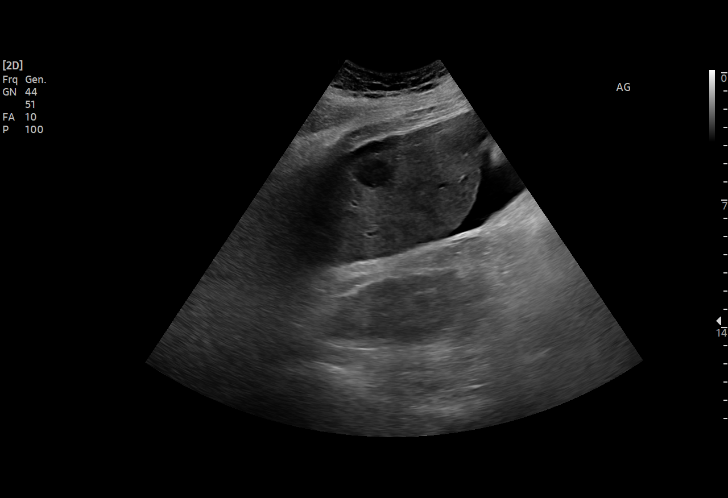
[im 4/20]
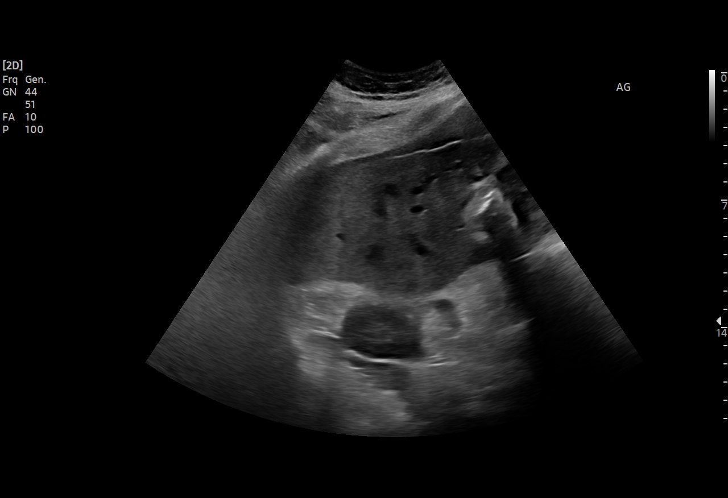
[im 5/20]
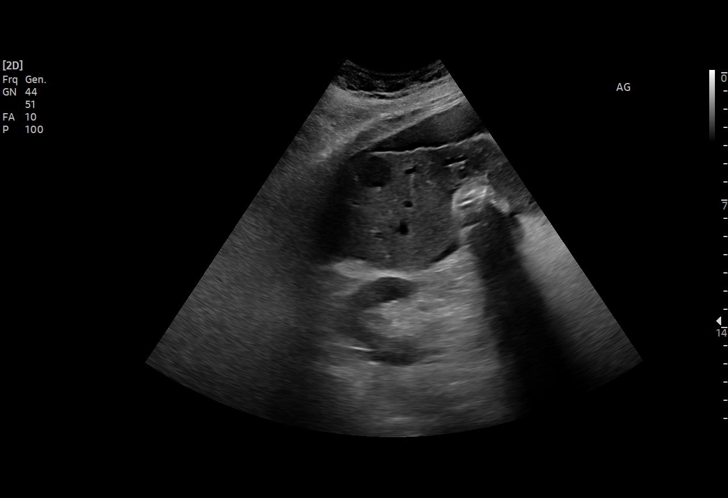
[im 7/20]
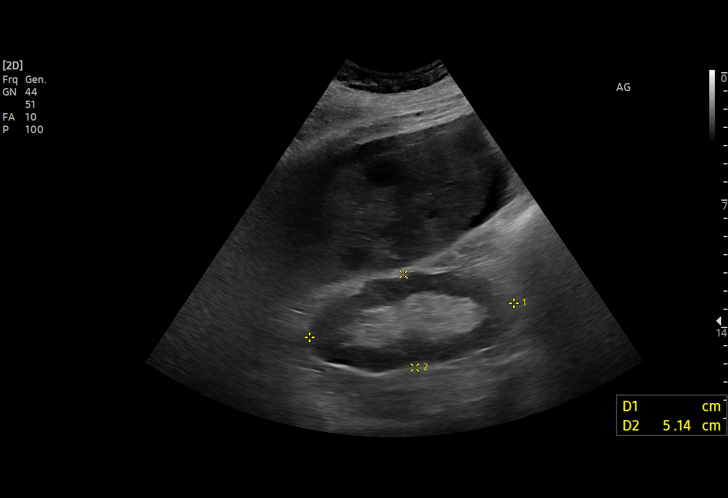
[im 8/20]
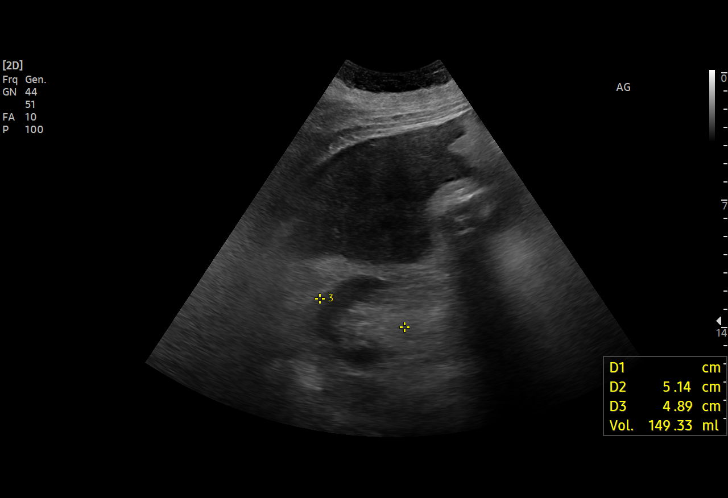
[im 9/20]
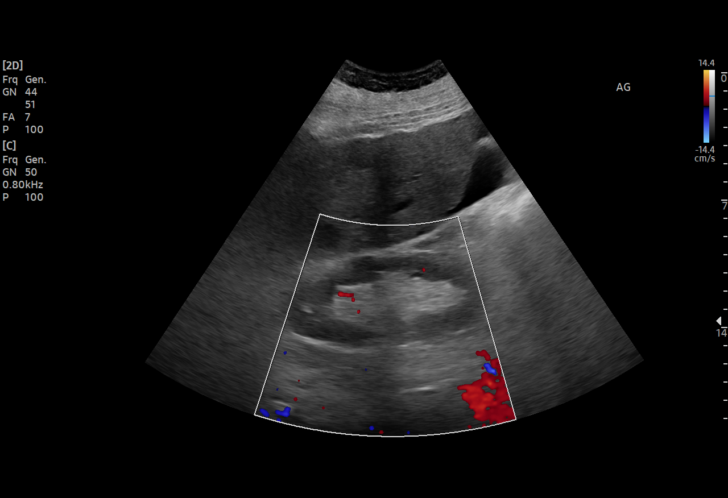
[im 11/20]
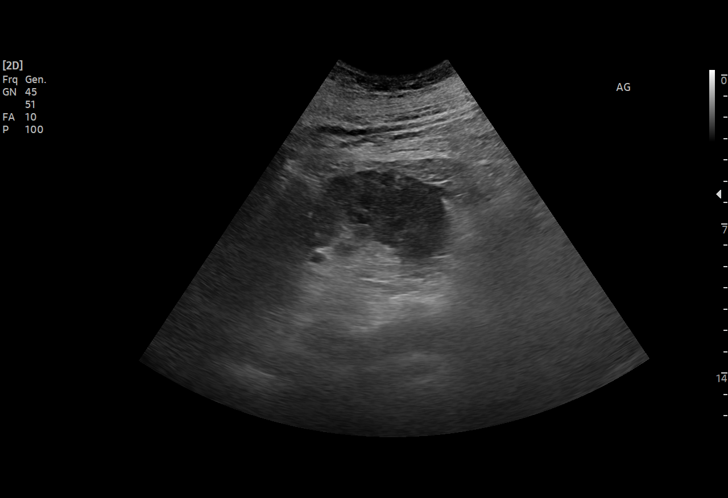
[im 12/20]
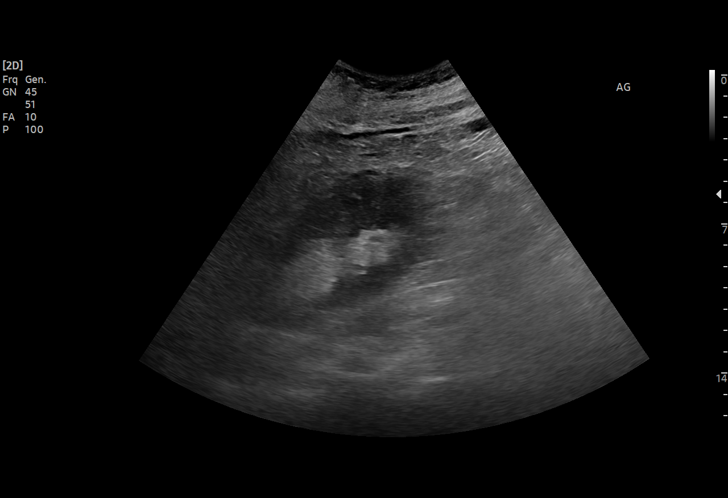
[im 13/20]
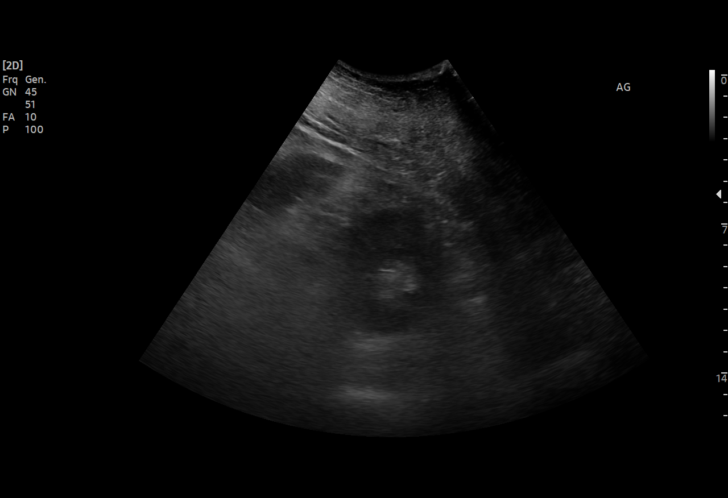
[im 15/20]
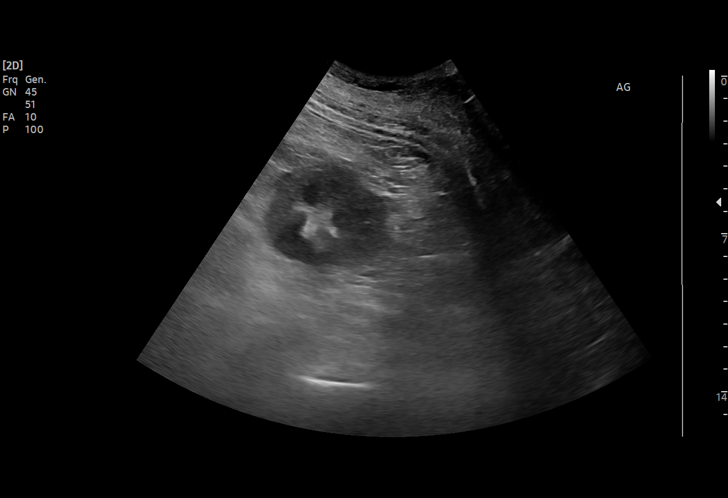
[im 16/20]
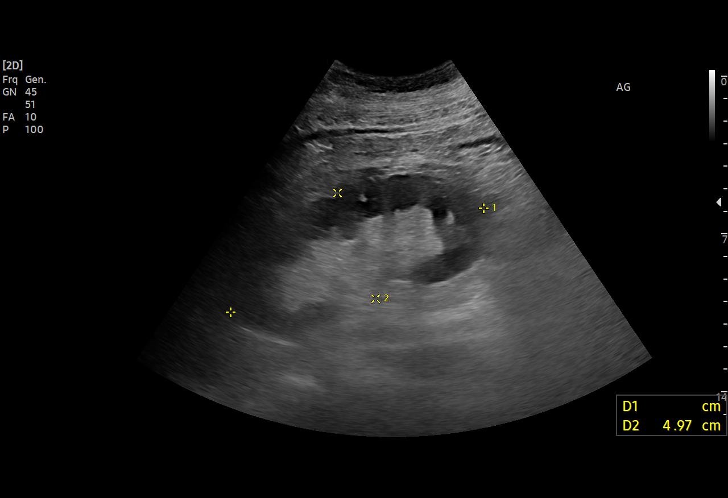
[im 17/20]
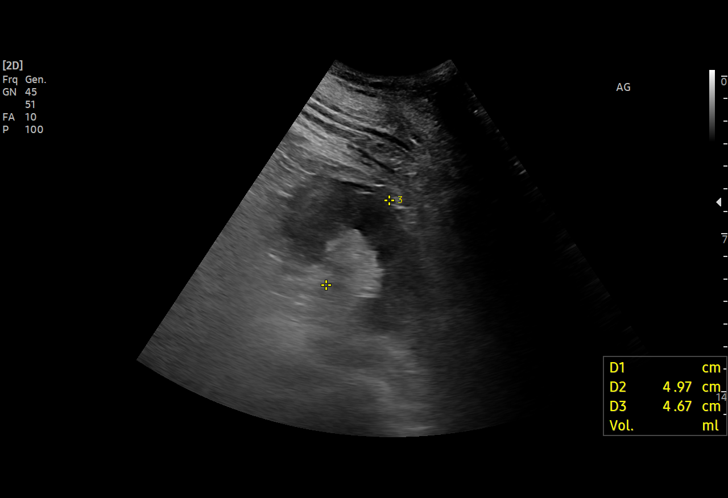
[im 19/20]
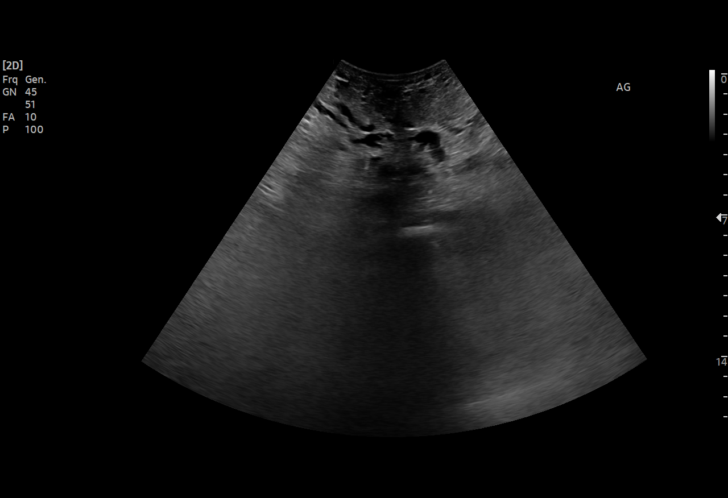
[im 20/20]
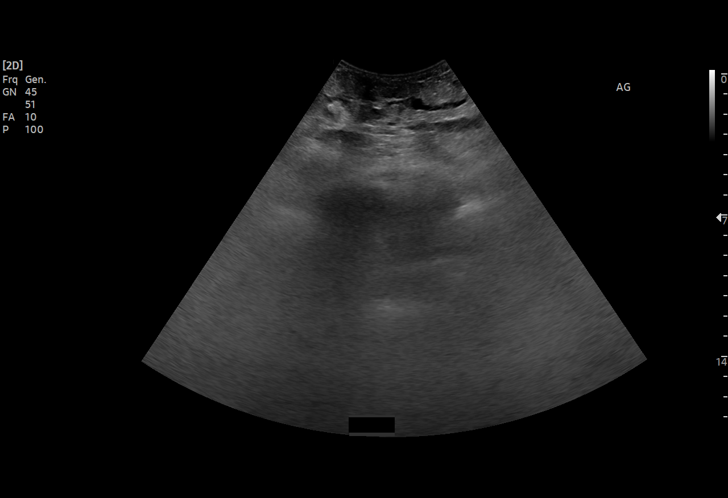

[15 of 20 positions shown; findings below may reference images not displayed]

FINDINGS: Right Kidney:

Renal measurements: 11.3 x 5.1 x 4.9 cm = volume: 149 mL.
Echogenicity within normal limits. No mass or hydronephrosis
visualized.

Left Kidney:

Renal measurements: 12.1 x 5.0 x 4.7 cm = volume: 147 mL.
Echogenicity within normal limits. No mass or hydronephrosis
visualized.

Bladder:

Foley catheter present within the decompressed bladder.

Other:

Partial hypoechoic solid imaging of the liver demonstrates multiple
lesions and biliary ductal dilatation. Correlation with prior MR
imaging confirms known metastatic disease.
IMPRESSION: 1. Unremarkable sonographic appearance of the kidneys. Specifically,
no evidence of hydronephrosis.
2. Incompletely imaged hepatic metastatic disease in biliary ductal
dilatation. Findings are known from prior MRI imaging dated
03/09/2021.
[DATE]. Moderate perihepatic ascites.

## 2022-11-26 IMAGING — DX DG CHEST 1V PORT
1 series · 1 of 1 positions shown · non-contrast
Comparison: Film from earlier in the same day.

CLINICAL DATA: Status post left jugular central line placement

EXAM:
PORTABLE CHEST 1 VIEW

[chest ap]
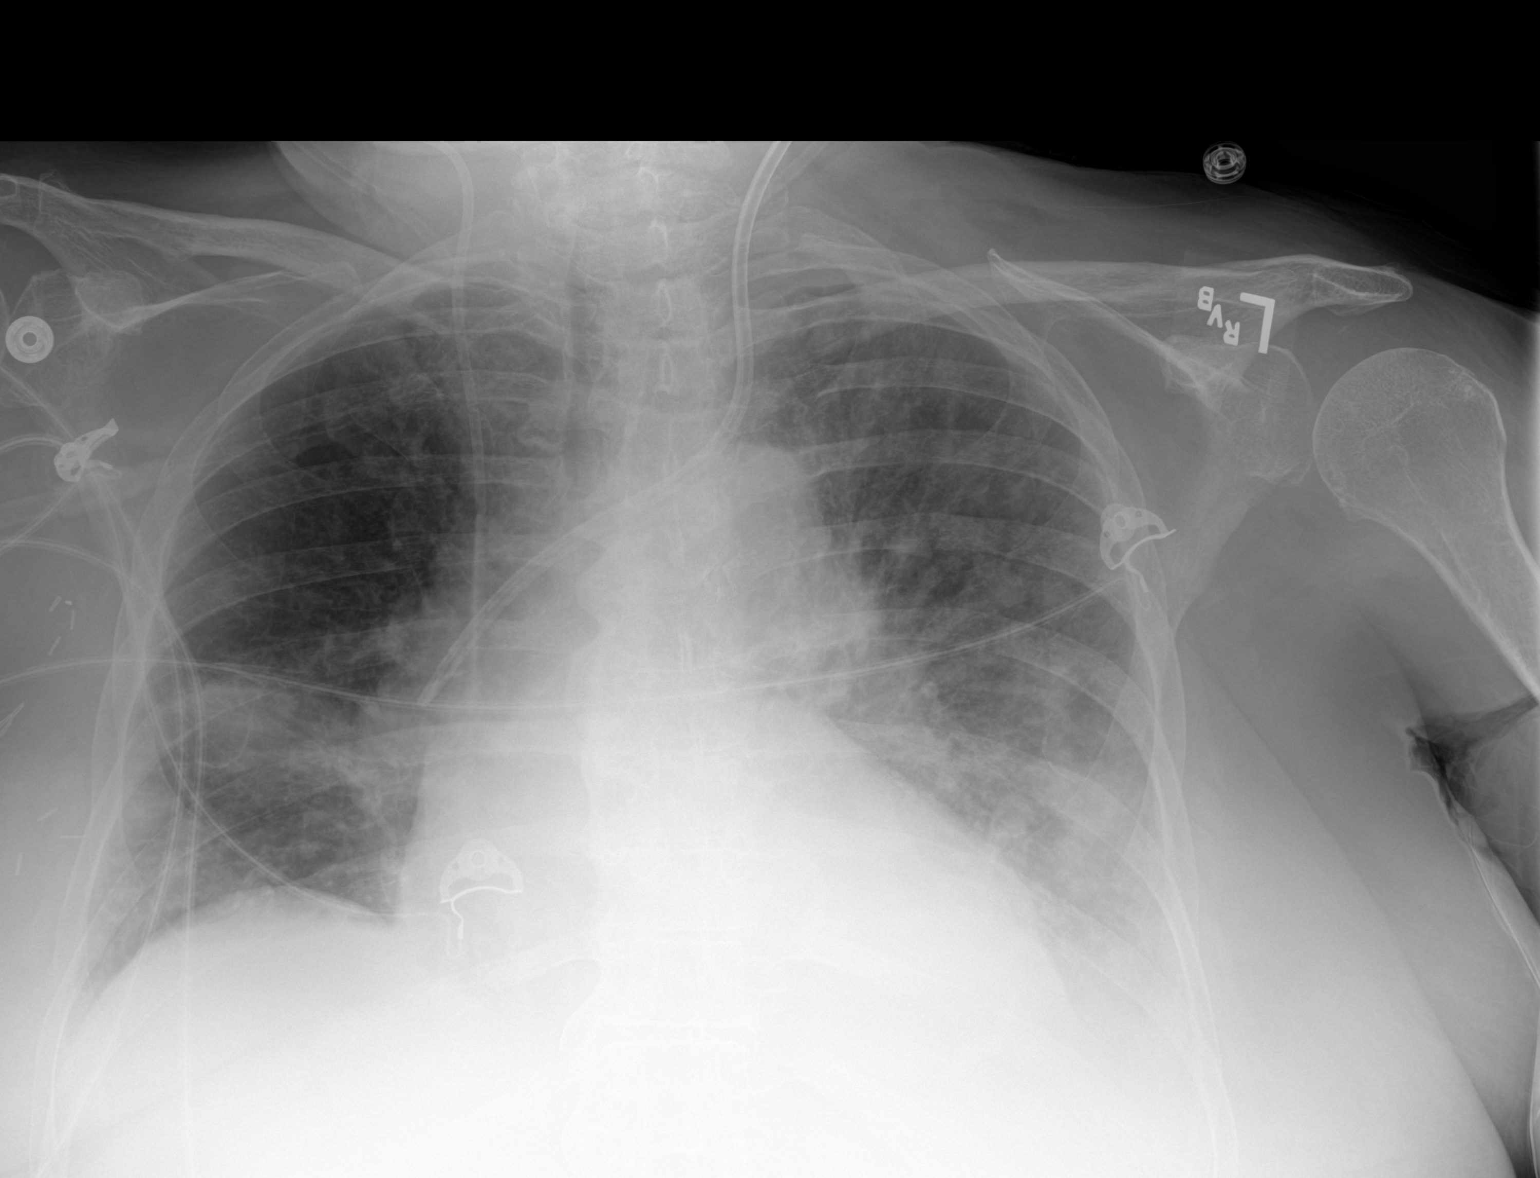

[1 of 1 positions shown; findings below may reference images not displayed]

FINDINGS: Cardiac shadow is stable. Right jugular central line is again noted
in the superior vena cava. The left jugular central line has been
repositioned with the tip at the cavoatrial junction. No
pneumothorax is noted. Patchy airspace opacities are again seen left
greater than right stable in appearance from the recent exam.
IMPRESSION: Tubes and lines in satisfactory position.

No pneumothorax following central line placement.

Stable bilateral airspace opacities left greater than right.
